# Patient Record
Sex: Female | Born: 1940
Health system: Southern US, Community
[De-identification: ages and names within clinical notes are randomized; demographics above are authoritative.]

## PROBLEM LIST (undated history)

## (undated) DIAGNOSIS — E7800 Pure hypercholesterolemia, unspecified: Secondary | ICD-10-CM

## (undated) DIAGNOSIS — N6320 Unspecified lump in the left breast, unspecified quadrant: Secondary | ICD-10-CM

## (undated) DIAGNOSIS — I10 Essential (primary) hypertension: Secondary | ICD-10-CM

## (undated) DIAGNOSIS — Z8619 Personal history of other infectious and parasitic diseases: Secondary | ICD-10-CM

## (undated) HISTORY — DX: Pure hypercholesterolemia, unspecified: E78.00

## (undated) HISTORY — PX: OTHER SURGICAL HISTORY: SHX169

## (undated) HISTORY — DX: Essential (primary) hypertension: I10

## (undated) HISTORY — DX: Personal history of other infectious and parasitic diseases: Z86.19

---

## 2009-02-13 ENCOUNTER — Ambulatory Visit: Payer: Self-pay | Admitting: Ophthalmology

## 2009-02-26 ENCOUNTER — Ambulatory Visit: Payer: Self-pay | Admitting: Ophthalmology

## 2015-01-26 ENCOUNTER — Encounter: Payer: Self-pay | Admitting: Internal Medicine

## 2015-01-26 ENCOUNTER — Ambulatory Visit (INDEPENDENT_AMBULATORY_CARE_PROVIDER_SITE_OTHER): Payer: Medicare Other | Admitting: Internal Medicine

## 2015-01-26 ENCOUNTER — Encounter (INDEPENDENT_AMBULATORY_CARE_PROVIDER_SITE_OTHER): Payer: Self-pay

## 2015-01-26 VITALS — BP 187/93 | Temp 98.1°F | Resp 70 | Ht 63.25 in | Wt 168.4 lb

## 2015-01-26 DIAGNOSIS — Z1239 Encounter for other screening for malignant neoplasm of breast: Secondary | ICD-10-CM | POA: Diagnosis not present

## 2015-01-26 DIAGNOSIS — R030 Elevated blood-pressure reading, without diagnosis of hypertension: Secondary | ICD-10-CM

## 2015-01-26 DIAGNOSIS — Z800 Family history of malignant neoplasm of digestive organs: Secondary | ICD-10-CM

## 2015-01-26 DIAGNOSIS — Z0000 Encounter for general adult medical examination without abnormal findings: Secondary | ICD-10-CM | POA: Diagnosis not present

## 2015-01-26 DIAGNOSIS — Z13220 Encounter for screening for lipoid disorders: Secondary | ICD-10-CM

## 2015-01-26 DIAGNOSIS — IMO0001 Reserved for inherently not codable concepts without codable children: Secondary | ICD-10-CM

## 2015-01-26 NOTE — Progress Notes (Signed)
Pre visit review using our clinic review tool, if applicable. No additional management support is needed unless otherwise documented below in the visit note. 

## 2015-01-28 ENCOUNTER — Encounter: Payer: Self-pay | Admitting: Internal Medicine

## 2015-01-28 DIAGNOSIS — Z0000 Encounter for general adult medical examination without abnormal findings: Secondary | ICD-10-CM | POA: Insufficient documentation

## 2015-01-28 DIAGNOSIS — I10 Essential (primary) hypertension: Secondary | ICD-10-CM | POA: Insufficient documentation

## 2015-01-28 DIAGNOSIS — Z800 Family history of malignant neoplasm of digestive organs: Secondary | ICD-10-CM | POA: Insufficient documentation

## 2015-01-28 NOTE — Assessment & Plan Note (Signed)
Needs a physical.  Schedule mammogram.  Needs colonoscopy.

## 2015-01-28 NOTE — Progress Notes (Signed)
[name], female   DOB: October 08, 1941, 74 y.o.   MRN: [id number]   [name], female    DOB: 1941/07/31, 74 y.o.   MRN: [id number]  HPI  Patient here to establish care.  She has not been seeing a regular physician.  Just goes to acute care if needed.  Stays active.  Goes to the gym.  Exercises.  Works second shift.  Gets her eyes checked regularly.  Has not had mammogram or colonoscopy.  Brother had colon cancer.  No cardiac symptoms with increased activity or exertion.  Breathing stable.  Eating and drinking well.  No nausea or vomiting.  No bowel change.     Past Medical History  Diagnosis Date  . History of chicken pox     Outpatient Encounter Prescriptions as of 01/26/2015  Medication Sig  . Nutritional Supplements (NUTRITIONAL SUPPLEMENT PO) Take by mouth. It Works-[facility]  . Nutritional Supplements (NUTRITIONAL SUPPLEMENT PO) Take by mouth. It works-[employer] [facility]    Review of Systems  Constitutional: Negative for appetite change and unexpected weight change.  HENT: Negative for congestion and sinus pressure.   Eyes: Negative for pain and visual disturbance.  Respiratory: Negative for cough, chest tightness and shortness of breath.   Cardiovascular: Negative for chest pain, palpitations and leg swelling.  Gastrointestinal: Negative for nausea, vomiting, abdominal pain and diarrhea.  Genitourinary: Negative for dysuria and difficulty urinating.  Musculoskeletal: Negative for back pain and joint swelling.  Skin: Negative for color change and rash.  Neurological: Negative for dizziness, light-headedness and headaches.  Hematological: Negative for adenopathy. Does not bruise/bleed easily.  Psychiatric/Behavioral: Negative for dysphoric mood and decreased concentration.       Objective:     Blood pressure recheck prior to leaving:  154/82  Physical Exam  Constitutional: She appears well-developed and well-nourished. No  distress.  HENT:  Nose: Nose normal.  Mouth/Throat: Oropharynx is clear and moist.  Eyes: Right eye exhibits no discharge. Left eye exhibits no discharge. No scleral icterus.  Neck: Neck supple. No thyromegaly present.  Cardiovascular: Normal rate and regular rhythm.   Pulmonary/Chest: Breath sounds normal. No respiratory distress. She has no wheezes.  Abdominal: Soft. Bowel sounds are normal. There is no tenderness.  Musculoskeletal: She exhibits no edema or tenderness.  Lymphadenopathy:    She has no cervical adenopathy.  Skin: No rash noted. No erythema.  Psychiatric: She has a normal mood and affect. Her behavior is normal.  Does appear to be anxious.     BP 187/93 mmHg  Temp(Src) 98.1 F (36.7 C) (Oral)  Resp 70  Ht 5' 3.25" (1.607 m)  Wt 168 lb 6 oz (76.374 kg)  BMI 29.57 kg/m2  SpO2 99% Wt Readings from Last 3 Encounters:  01/26/15 168 lb 6 oz (76.374 kg)      Assessment & Plan:   Problem List Items Addressed This Visit    Elevated blood pressure - Primary    Blood pressure as outlined.  She was anxious.  Improved prior to leaving, but still elevated.  Have her spot check her pressure.  Get her back in soon to reassess.  Check metabolic panel.        Relevant Orders   CBC with Differential/Platelet   Comprehensive metabolic panel   TSH   Family history of colon cancer    Brother with colon cancer.  Has never had colonoscopy.  Needs.  Discussed with her today.  Health care maintenance    Needs a physical.  Schedule mammogram.  Needs colonoscopy.         Other Visit Diagnoses    Breast cancer screening        Relevant Orders    MM DIGITAL SCREENING BILATERAL    Screening cholesterol level        Relevant Orders    Lipid panel      I spent 30 minutes with the patient and more than 50% of the time was spent in consultation regarding the above.     Einar Pheasant, MD

## 2015-01-28 NOTE — Assessment & Plan Note (Signed)
Brother with colon cancer.  Has never had colonoscopy.  Needs.  Discussed with her today.

## 2015-01-28 NOTE — Assessment & Plan Note (Signed)
Blood pressure as outlined.  She was anxious.  Improved prior to leaving, but still elevated.  Have her spot check her pressure.  Get her back in soon to reassess.  Check metabolic panel.

## 2015-02-02 ENCOUNTER — Other Ambulatory Visit (INDEPENDENT_AMBULATORY_CARE_PROVIDER_SITE_OTHER): Payer: Medicare Other

## 2015-02-02 DIAGNOSIS — IMO0001 Reserved for inherently not codable concepts without codable children: Secondary | ICD-10-CM

## 2015-02-02 DIAGNOSIS — R030 Elevated blood-pressure reading, without diagnosis of hypertension: Secondary | ICD-10-CM | POA: Diagnosis not present

## 2015-02-02 DIAGNOSIS — Z13220 Encounter for screening for lipoid disorders: Secondary | ICD-10-CM | POA: Diagnosis not present

## 2015-02-02 LAB — COMPREHENSIVE METABOLIC PANEL
ALK PHOS: 63 U/L (ref 39–117)
ALT: 22 U/L (ref 0–35)
AST: 21 U/L (ref 0–37)
Albumin: 4.2 g/dL (ref 3.5–5.2)
BILIRUBIN TOTAL: 0.6 mg/dL (ref 0.2–1.2)
BUN: 19 mg/dL (ref 6–23)
CO2: 26 mEq/L (ref 19–32)
Calcium: 10.1 mg/dL (ref 8.4–10.5)
Chloride: 109 mEq/L (ref 96–112)
Creatinine, Ser: 0.93 mg/dL (ref 0.40–1.20)
GFR: 75.84 mL/min (ref >60.00)
GLUCOSE: 90 mg/dL (ref 70–99)
Potassium: 3.9 mEq/L (ref 3.5–5.1)
Sodium: 141 mEq/L (ref 135–145)
Total Protein: 6.9 g/dL (ref 6.0–8.3)

## 2015-02-02 LAB — CBC WITH DIFFERENTIAL/PLATELET
BASOS ABS: 0 10*3/uL (ref 0.0–0.1)
Basophils Relative: 0.6 % (ref 0.0–3.0)
Eosinophils Absolute: 0.1 10*3/uL (ref 0.0–0.7)
Eosinophils Relative: 3 % (ref 0.0–5.0)
HCT: 38.9 % (ref 36.0–46.0)
Hemoglobin: 13.4 g/dL (ref 12.0–15.0)
LYMPHS PCT: 32.7 % (ref 12.0–46.0)
Lymphs Abs: 1.3 10*3/uL (ref 0.7–4.0)
MCHC: 34.3 g/dL (ref 30.0–36.0)
MCV: 89.4 fl (ref 78.0–100.0)
Monocytes Absolute: 0.3 10*3/uL (ref 0.1–1.0)
Monocytes Relative: 6.4 % (ref 3.0–12.0)
NEUTROS PCT: 57.3 % (ref 43.0–77.0)
Neutro Abs: 2.3 10*3/uL (ref 1.4–7.7)
Platelets: 277 10*3/uL (ref 150.0–400.0)
RBC: 4.36 Mil/uL (ref 3.87–5.11)
RDW: 15.4 % (ref 11.5–15.5)
WBC: 4 10*3/uL (ref 4.0–10.5)

## 2015-02-02 LAB — LIPID PANEL
CHOL/HDL RATIO: 4
Cholesterol: 241 mg/dL — ABNORMAL HIGH (ref 0–200)
HDL: 56.4 mg/dL (ref >39.00)
LDL Cholesterol: 170 mg/dL — ABNORMAL HIGH (ref 0–99)
NonHDL: 184.6
Triglycerides: 75 mg/dL (ref 0.0–149.0)
VLDL: 15 mg/dL (ref 0.0–40.0)

## 2015-02-02 LAB — TSH: TSH: 1.29 u[IU]/mL (ref 0.35–4.50)

## 2015-02-05 NOTE — Progress Notes (Signed)
Left message on Vm to return call.

## 2015-02-05 NOTE — Progress Notes (Signed)
Left message on VM to return call 

## 2015-02-07 ENCOUNTER — Encounter: Payer: Self-pay | Admitting: *Deleted

## 2015-02-07 NOTE — Progress Notes (Signed)
Left message on VM to return call 

## 2015-02-12 ENCOUNTER — Ambulatory Visit (INDEPENDENT_AMBULATORY_CARE_PROVIDER_SITE_OTHER): Payer: Medicare Other | Admitting: Internal Medicine

## 2015-02-12 ENCOUNTER — Encounter: Payer: Self-pay | Admitting: Internal Medicine

## 2015-02-12 VITALS — BP 190/90 | HR 70 | Temp 98.1°F | Ht 63.25 in | Wt 170.4 lb

## 2015-02-12 DIAGNOSIS — I10 Essential (primary) hypertension: Secondary | ICD-10-CM | POA: Diagnosis not present

## 2015-02-12 MED ORDER — LISINOPRIL-HYDROCHLOROTHIAZIDE 10-12.5 MG PO TABS: 1.0000 | ORAL_TABLET | Freq: Every day | ORAL | Status: DC

## 2015-02-12 NOTE — Progress Notes (Signed)
Pre visit review using our clinic review tool, if applicable. No additional management support is needed unless otherwise documented below in the visit note. 

## 2015-02-18 ENCOUNTER — Encounter: Payer: Self-pay | Admitting: Internal Medicine

## 2015-02-18 NOTE — Assessment & Plan Note (Signed)
Blood pressure remains elevated.  Start lisinopril/hctz 10/12.5 q day.  Follow pressures.  Check metabolic panel in 14-70 days.

## 2015-02-18 NOTE — Progress Notes (Signed)
[name], female   DOB: 04-Jan-1941, 74 y.o.   MRN: [id number]   [name], female    DOB: May 12, 1941, 74 y.o.   MRN: [id number]  HPI  Patient here for a scheduled follow up.  Here to f/u regarding her blood pressure.  Got a few outside checks - blood pressure averaging 166-173/64-73.  Feels good.  No chest pain or tightness.  No sob.  Eating and drinking well.  Drinking energy shakes.  Will stop.  Avoid decongestants.  Monitor salt intake.    Past Medical History  Diagnosis Date  . History of chicken pox     Review of Systems  Constitutional: Negative for appetite change and unexpected weight change.  Respiratory: Negative for cough, chest tightness and shortness of breath.   Cardiovascular: Negative for chest pain, palpitations and leg swelling.  Gastrointestinal: Negative for nausea, vomiting and abdominal pain.  Neurological: Negative for dizziness, light-headedness and headaches.       Objective:     Blood pressure recheck:  168/78  Physical Exam  Constitutional: She appears well-developed and well-nourished. No distress.  Neck: Neck supple. No thyromegaly present.  Cardiovascular: Normal rate and regular rhythm.   Pulmonary/Chest: Breath sounds normal. No respiratory distress. She has no wheezes.  Abdominal: Soft. Bowel sounds are normal. There is no tenderness.  Musculoskeletal: She exhibits no edema or tenderness.  Lymphadenopathy:    She has no cervical adenopathy.    BP 190/90 mmHg  Pulse 70  Temp(Src) 98.1 F (36.7 C) (Oral)  Ht 5' 3.25" (1.607 m)  Wt 170 lb 6 oz (77.282 kg)  BMI 29.93 kg/m2  SpO2 97% Wt Readings from Last 3 Encounters:  02/12/15 170 lb 6 oz (77.282 kg)  01/26/15 168 lb 6 oz (76.374 kg)     Lab Results  Component Value Date   WBC 4.0 02/02/2015   HGB 13.4 02/02/2015   HCT 38.9 02/02/2015   PLT 277.0 02/02/2015   GLUCOSE 90 02/02/2015   CHOL 241* 02/02/2015   TRIG 75.0 02/02/2015     HDL 56.40 02/02/2015   LDLCALC 170* 02/02/2015   ALT 22 02/02/2015   AST 21 02/02/2015   NA 141 02/02/2015   K 3.9 02/02/2015   CL 109 02/02/2015   CREATININE 0.93 02/02/2015   BUN 19 02/02/2015   CO2 26 02/02/2015   TSH 1.29 02/02/2015       Assessment & Plan:   Problem List Items Addressed This Visit    Hypertension, essential - Primary    Blood pressure remains elevated.  Start lisinopril/hctz 10/12.5 q day.  Follow pressures.  Check metabolic panel in 62-37 days.        Relevant Medications   lisinopril-hydrochlorothiazide (PRINZIDE,ZESTORETIC) 10-12.5 MG per tablet       Einar Pheasant, MD

## 2015-02-26 ENCOUNTER — Other Ambulatory Visit (INDEPENDENT_AMBULATORY_CARE_PROVIDER_SITE_OTHER): Payer: Medicare Other

## 2015-02-26 DIAGNOSIS — I10 Essential (primary) hypertension: Secondary | ICD-10-CM

## 2015-02-26 LAB — BASIC METABOLIC PANEL
BUN: 22 mg/dL (ref 6–23)
CO2: 27 mEq/L (ref 19–32)
Calcium: 10.1 mg/dL (ref 8.4–10.5)
Chloride: 108 mEq/L (ref 96–112)
Creatinine, Ser: 1.08 mg/dL (ref 0.40–1.20)
GFR: 63.81 mL/min (ref >60.00)
Glucose, Bld: 84 mg/dL (ref 70–99)
Potassium: 4.1 mEq/L (ref 3.5–5.1)
Sodium: 141 mEq/L (ref 135–145)

## 2015-02-27 ENCOUNTER — Encounter: Payer: Self-pay | Admitting: *Deleted

## 2015-03-14 ENCOUNTER — Encounter: Payer: Self-pay | Admitting: Internal Medicine

## 2015-03-14 ENCOUNTER — Ambulatory Visit (INDEPENDENT_AMBULATORY_CARE_PROVIDER_SITE_OTHER): Payer: Medicare Other | Admitting: Internal Medicine

## 2015-03-14 ENCOUNTER — Telehealth: Payer: Self-pay | Admitting: *Deleted

## 2015-03-14 VITALS — BP 151/76 | HR 63 | Temp 98.0°F | Ht 63.25 in | Wt 173.0 lb

## 2015-03-14 DIAGNOSIS — I10 Essential (primary) hypertension: Secondary | ICD-10-CM | POA: Diagnosis not present

## 2015-03-14 MED ORDER — LISINOPRIL-HYDROCHLOROTHIAZIDE 20-25 MG PO TABS: 1.0000 | ORAL_TABLET | Freq: Every day | ORAL | Status: DC

## 2015-03-14 NOTE — Assessment & Plan Note (Signed)
Blood pressure improved, but still elevated.  Increase lisinopril/hctz to 20/25 q day.  Follow pressures.  Will plan for f/u metabolic panel with next visit.  Continue decrease sodium intake.

## 2015-03-14 NOTE — Telephone Encounter (Signed)
Pt notified and scheduled.

## 2015-03-14 NOTE — Progress Notes (Signed)
[name], female   DOB: 15-Apr-1941, 74 y.o.   MRN: [id number]   [name], female    DOB: Sep 25, 1941, 74 y.o.   MRN: [id number]  HPI  Patient here for a scheduled follow up.  Here to follow up on her blood pressure.  Stays active.  Goes to the gym.  No cardiac symptoms with increased activity or exertion.  No sob.  Breathing stable.  Eating and drinking well.  Trying to cut back on sodium intake.  Bowels stable.  Tolerating her blood pressure medication.  No side effects.     Past Medical History  Diagnosis Date  . History of chicken pox     Current Outpatient Prescriptions on File Prior to Visit  Medication Sig Dispense Refill  . Nutritional Supplements (NUTRITIONAL SUPPLEMENT PO) Take by mouth. It Works-[facility]    . Nutritional Supplements (NUTRITIONAL SUPPLEMENT PO) Take by mouth. It works-[facility]     No current facility-administered medications on file prior to visit.    Review of Systems  Constitutional: Negative for appetite change and unexpected weight change.  HENT: Negative for congestion and sinus pressure.   Respiratory: Negative for cough, chest tightness and shortness of breath.   Cardiovascular: Negative for chest pain, palpitations and leg swelling.  Gastrointestinal: Negative for nausea, vomiting, abdominal pain and diarrhea.  Neurological: Negative for dizziness, light-headedness and headaches.       Objective:     Blood pressure recheck:  156/76  Physical Exam  Constitutional: She appears well-developed and well-nourished. No distress.  HENT:  Nose: Nose normal.  Mouth/Throat: Oropharynx is clear and moist.  Neck: Neck supple. No thyromegaly present.  Cardiovascular: Normal rate and regular rhythm.   Pulmonary/Chest: Breath sounds normal. No respiratory distress. She has no wheezes.  Abdominal: Soft. Bowel sounds are normal. There is no tenderness.  Musculoskeletal: She exhibits no edema  or tenderness.  Lymphadenopathy:    She has no cervical adenopathy.  Skin: No rash noted. No erythema.  Psychiatric: She has a normal mood and affect. Her behavior is normal.    BP 151/76 mmHg  Pulse 63  Temp(Src) 98 F (36.7 C) (Oral)  Ht 5' 3.25" (1.607 m)  Wt 173 lb (78.472 kg)  BMI 30.39 kg/m2  SpO2 99% Wt Readings from Last 3 Encounters:  03/14/15 173 lb (78.472 kg)  02/12/15 170 lb 6 oz (77.282 kg)  01/26/15 168 lb 6 oz (76.374 kg)     Lab Results  Component Value Date   WBC 4.0 02/02/2015   HGB 13.4 02/02/2015   HCT 38.9 02/02/2015   PLT 277.0 02/02/2015   GLUCOSE 84 02/26/2015   CHOL 241* 02/02/2015   TRIG 75.0 02/02/2015   HDL 56.40 02/02/2015   LDLCALC 170* 02/02/2015   ALT 22 02/02/2015   AST 21 02/02/2015   NA 141 02/26/2015   K 4.1 02/26/2015   CL 108 02/26/2015   CREATININE 1.08 02/26/2015   BUN 22 02/26/2015   CO2 27 02/26/2015   TSH 1.29 02/02/2015       Assessment & Plan:   Problem List Items Addressed This Visit    Hypertension, essential - Primary    Blood pressure improved, but still elevated.  Increase lisinopril/hctz to 20/25 q day.  Follow pressures.  Will plan for f/u metabolic panel with next visit.  Continue decrease sodium intake.        Relevant Medications   lisinopril-hydrochlorothiazide (PRINZIDE,ZESTORETIC) 20-25 MG  per tablet       Einar Pheasant, MD

## 2015-03-14 NOTE — Telephone Encounter (Signed)
Can put her in 12:30 on 04/04/15.  Thanks

## 2015-03-14 NOTE — Progress Notes (Signed)
Pre visit review using our clinic review tool, if applicable. No additional management support is needed unless otherwise documented below in the visit note. 

## 2015-03-14 NOTE — Telephone Encounter (Signed)
LVM for pt to schedule f/u appt w/ Dr. Nicki Reaper

## 2015-04-04 ENCOUNTER — Ambulatory Visit (INDEPENDENT_AMBULATORY_CARE_PROVIDER_SITE_OTHER): Payer: Medicare Other | Admitting: Internal Medicine

## 2015-04-04 ENCOUNTER — Encounter: Payer: Self-pay | Admitting: Internal Medicine

## 2015-04-04 VITALS — BP 165/71 | HR 64 | Temp 98.1°F | Ht 63.25 in | Wt 175.5 lb

## 2015-04-04 DIAGNOSIS — Z0000 Encounter for general adult medical examination without abnormal findings: Secondary | ICD-10-CM | POA: Diagnosis not present

## 2015-04-04 DIAGNOSIS — I10 Essential (primary) hypertension: Secondary | ICD-10-CM | POA: Diagnosis not present

## 2015-04-04 LAB — BASIC METABOLIC PANEL
BUN: 24 mg/dL — AB (ref 6–23)
CALCIUM: 9.6 mg/dL (ref 8.4–10.5)
CHLORIDE: 106 meq/L (ref 96–112)
CO2: 28 mEq/L (ref 19–32)
Creatinine, Ser: 1.1 mg/dL (ref 0.40–1.20)
GFR: 62.45 mL/min (ref >60.00)
Glucose, Bld: 112 mg/dL — ABNORMAL HIGH (ref 70–99)
Potassium: 4 mEq/L (ref 3.5–5.1)
Sodium: 140 mEq/L (ref 135–145)

## 2015-04-04 MED ORDER — LISINOPRIL-HYDROCHLOROTHIAZIDE 20-25 MG PO TABS: 1.0000 | ORAL_TABLET | Freq: Every day | ORAL | Status: DC

## 2015-04-04 NOTE — Progress Notes (Signed)
[name], female   DOB: 1941-06-23, 74 y.o.   MRN: [id number]   [name], female    DOB: 1940-11-13, 74 y.o.   MRN: [id number]  HPI  Patient here for a scheduled follow up.  She has been taking lisinopril/hctz 20/25.  Tolerating.  Blood pressure doing better.  Going to the gym.  Trying to watch her diet.  Feels good.  No cardiac symptoms with increased activity or exertion.  Breathing stable.     Past Medical History  Diagnosis Date  . History of chicken pox     Outpatient Encounter Prescriptions as of 04/04/2015  Medication Sig  . lisinopril-hydrochlorothiazide (PRINZIDE,ZESTORETIC) 20-25 MG per tablet Take 1 tablet by mouth daily.  . [DISCONTINUED] lisinopril-hydrochlorothiazide (PRINZIDE,ZESTORETIC) 20-25 MG per tablet Take 1 tablet by mouth daily.  . [DISCONTINUED] Nutritional Supplements (NUTRITIONAL SUPPLEMENT PO) Take by mouth. It Works-[facility]  . [DISCONTINUED] Nutritional Supplements (NUTRITIONAL SUPPLEMENT PO) Take by mouth. It works-[facility]   No facility-administered encounter medications on file as of 04/04/2015.    Review of Systems  Constitutional: Negative for appetite change and unexpected weight change.  HENT: Negative for congestion and sinus pressure.   Respiratory: Negative for cough, chest tightness and shortness of breath.   Cardiovascular: Negative for chest pain, palpitations and leg swelling.  Gastrointestinal: Negative for nausea, vomiting, abdominal pain and diarrhea.  Neurological: Negative for dizziness, light-headedness and headaches.       Objective:     Blood pressure recheck:  132/62, pulse 68  Physical Exam  Constitutional: She appears well-developed and well-nourished. No distress.  HENT:  Nose: Nose normal.  Mouth/Throat: Oropharynx is clear and moist.  Neck: Neck supple. No thyromegaly present.  Cardiovascular: Normal rate and regular rhythm.   Pulmonary/Chest: Breath  sounds normal. No respiratory distress. She has no wheezes.  Abdominal: Soft. Bowel sounds are normal. There is no tenderness.  Musculoskeletal: She exhibits no edema or tenderness.  Lymphadenopathy:    She has no cervical adenopathy.    BP 165/71 mmHg  Pulse 64  Temp(Src) 98.1 F (36.7 C) (Oral)  Ht 5' 3.25" (1.607 m)  Wt 175 lb 8 oz (79.606 kg)  BMI 30.83 kg/m2  SpO2 97% Wt Readings from Last 3 Encounters:  04/04/15 175 lb 8 oz (79.606 kg)  03/14/15 173 lb (78.472 kg)  02/12/15 170 lb 6 oz (77.282 kg)     Lab Results  Component Value Date   WBC 4.0 02/02/2015   HGB 13.4 02/02/2015   HCT 38.9 02/02/2015   PLT 277.0 02/02/2015   GLUCOSE 112* 04/04/2015   CHOL 241* 02/02/2015   TRIG 75.0 02/02/2015   HDL 56.40 02/02/2015   LDLCALC 170* 02/02/2015   ALT 22 02/02/2015   AST 21 02/02/2015   NA 140 04/04/2015   K 4.0 04/04/2015   CL 106 04/04/2015   CREATININE 1.10 04/04/2015   BUN 24* 04/04/2015   CO2 28 04/04/2015   TSH 1.29 02/02/2015       Assessment & Plan:   Problem List Items Addressed This Visit    Health care maintenance    Schedule her for a physical.       Hypertension, essential - Primary    Blood pressure doing much better.  Continue same medication regimen.  Check metabolic panel.        Relevant Medications   lisinopril-hydrochlorothiazide (PRINZIDE,ZESTORETIC) 20-25 MG per tablet   Other Relevant Orders   Basic metabolic  panel (Completed)       Einar Pheasant, MD

## 2015-04-04 NOTE — Progress Notes (Signed)
Pre visit review using our clinic review tool, if applicable. No additional management support is needed unless otherwise documented below in the visit note. 

## 2015-04-05 ENCOUNTER — Encounter: Payer: Self-pay | Admitting: *Deleted

## 2015-04-08 ENCOUNTER — Encounter: Payer: Self-pay | Admitting: Internal Medicine

## 2015-04-08 NOTE — Assessment & Plan Note (Signed)
Blood pressure doing much better.  Continue same medication regimen.  Check metabolic panel.

## 2015-04-08 NOTE — Assessment & Plan Note (Signed)
Schedule her for a physical.

## 2015-05-01 ENCOUNTER — Telehealth: Payer: Self-pay

## 2015-05-01 NOTE — Telephone Encounter (Signed)
L/M for pt to call back regarding mammogram

## 2015-07-10 ENCOUNTER — Telehealth: Payer: Self-pay | Admitting: *Deleted

## 2015-07-10 ENCOUNTER — Other Ambulatory Visit: Payer: Self-pay | Admitting: Internal Medicine

## 2015-07-10 ENCOUNTER — Other Ambulatory Visit (INDEPENDENT_AMBULATORY_CARE_PROVIDER_SITE_OTHER): Payer: Medicare Other

## 2015-07-10 DIAGNOSIS — E7800 Pure hypercholesterolemia, unspecified: Secondary | ICD-10-CM

## 2015-07-10 DIAGNOSIS — N289 Disorder of kidney and ureter, unspecified: Secondary | ICD-10-CM

## 2015-07-10 LAB — LIPID PANEL
CHOL/HDL RATIO: 6
Cholesterol: 289 mg/dL — ABNORMAL HIGH (ref 0–200)
HDL: 46 mg/dL (ref >39.00)
LDL Cholesterol: 212 mg/dL — ABNORMAL HIGH (ref 0–99)
NonHDL: 243.07
Triglycerides: 153 mg/dL — ABNORMAL HIGH (ref 0.0–149.0)
VLDL: 30.6 mg/dL (ref 0.0–40.0)

## 2015-07-10 LAB — COMPREHENSIVE METABOLIC PANEL
ALT: 23 U/L (ref 0–35)
AST: 18 U/L (ref 0–37)
Albumin: 4.2 g/dL (ref 3.5–5.2)
Alkaline Phosphatase: 63 U/L (ref 39–117)
BUN: 35 mg/dL — AB (ref 6–23)
CO2: 28 meq/L (ref 19–32)
Calcium: 10 mg/dL (ref 8.4–10.5)
Chloride: 107 mEq/L (ref 96–112)
Creatinine, Ser: 1.26 mg/dL — ABNORMAL HIGH (ref 0.40–1.20)
GFR: 53.35 mL/min — AB (ref >60.00)
Glucose, Bld: 88 mg/dL (ref 70–99)
POTASSIUM: 4.4 meq/L (ref 3.5–5.1)
Sodium: 143 mEq/L (ref 135–145)
TOTAL PROTEIN: 6.7 g/dL (ref 6.0–8.3)
Total Bilirubin: 0.4 mg/dL (ref 0.2–1.2)

## 2015-07-10 NOTE — Progress Notes (Signed)
Order placed for f/u met b 

## 2015-07-10 NOTE — Telephone Encounter (Signed)
Order placed for labs.

## 2015-07-10 NOTE — Telephone Encounter (Signed)
Labs and dx?  

## 2015-07-12 ENCOUNTER — Other Ambulatory Visit: Payer: Self-pay | Admitting: *Deleted

## 2015-07-12 MED ORDER — ROSUVASTATIN CALCIUM 10 MG PO TABS: 10.0000 mg | ORAL_TABLET | Freq: Every day | ORAL | Status: DC

## 2015-07-13 ENCOUNTER — Encounter: Payer: Self-pay | Admitting: Internal Medicine

## 2015-07-13 ENCOUNTER — Ambulatory Visit (INDEPENDENT_AMBULATORY_CARE_PROVIDER_SITE_OTHER): Payer: Medicare Other | Admitting: Internal Medicine

## 2015-07-13 VITALS — BP 138/64 | HR 70 | Temp 98.1°F | Ht 63.75 in | Wt 179.5 lb

## 2015-07-13 DIAGNOSIS — Z1239 Encounter for other screening for malignant neoplasm of breast: Secondary | ICD-10-CM | POA: Diagnosis not present

## 2015-07-13 DIAGNOSIS — E7800 Pure hypercholesterolemia, unspecified: Secondary | ICD-10-CM

## 2015-07-13 DIAGNOSIS — R7989 Other specified abnormal findings of blood chemistry: Secondary | ICD-10-CM

## 2015-07-13 DIAGNOSIS — R748 Abnormal levels of other serum enzymes: Secondary | ICD-10-CM

## 2015-07-13 DIAGNOSIS — I10 Essential (primary) hypertension: Secondary | ICD-10-CM | POA: Diagnosis not present

## 2015-07-13 DIAGNOSIS — Z0000 Encounter for general adult medical examination without abnormal findings: Secondary | ICD-10-CM | POA: Diagnosis not present

## 2015-07-13 DIAGNOSIS — Z800 Family history of malignant neoplasm of digestive organs: Secondary | ICD-10-CM

## 2015-07-13 DIAGNOSIS — N289 Disorder of kidney and ureter, unspecified: Secondary | ICD-10-CM

## 2015-07-13 LAB — BASIC METABOLIC PANEL
BUN: 38 mg/dL — ABNORMAL HIGH (ref 6–23)
CALCIUM: 9.8 mg/dL (ref 8.4–10.5)
CO2: 26 meq/L (ref 19–32)
Chloride: 103 mEq/L (ref 96–112)
Creatinine, Ser: 1.23 mg/dL — ABNORMAL HIGH (ref 0.40–1.20)
GFR: 54.86 mL/min — AB (ref >60.00)
Glucose, Bld: 101 mg/dL — ABNORMAL HIGH (ref 70–99)
Potassium: 3.8 mEq/L (ref 3.5–5.1)
SODIUM: 138 meq/L (ref 135–145)

## 2015-07-13 MED ORDER — LISINOPRIL-HYDROCHLOROTHIAZIDE 20-25 MG PO TABS: 1.0000 | ORAL_TABLET | Freq: Every day | ORAL | Status: DC

## 2015-07-13 NOTE — Progress Notes (Signed)
Pre-visit discussion using our clinic review tool. No additional management support is needed unless otherwise documented below in the visit note.  

## 2015-07-13 NOTE — Progress Notes (Signed)
[name], female   DOB: June 07, 1941, 74 y.o.   MRN: [id number]   [name], female    DOB: 02/09/1941, 74 y.o.   MRN: [id number]  HPI  Patient here to follow up on her blood pressure and for a complete physical exam.  She is staying active.  Working.  No cardiac symptoms with increased activity or exertion.  No sob.  Taking her blood pressure medication.  No acid reflux.  No abdominal pain or cramping.  Bowels stable.  States blood pressure is better.  Gets anxious when coming into the office.  Recently found to have elevated cholesterol.  Discussed starting crestor.  Has not started yet.  Plans to start today.     Past Medical History  Diagnosis Date  . History of chicken pox   . Essential hypertension   . Hypercholesterolemia    Past Surgical History  Procedure Laterality Date  . Cataract surgery      bilateral   Family History  Problem Relation Age of Onset  . Hypertension Mother   . Colon cancer Brother   . Congestive Heart Failure Mother    Social History   Social History  . Marital Status: Single    Spouse Name: N/A  . Number of Children: N/A  . Years of Education: N/A   Social History Main Topics  . Smoking status: Never Smoker   . Smokeless tobacco: Never Used  . Alcohol Use: No  . Drug Use: No  . Sexual Activity: Not Asked   Other Topics Concern  . None   Social History Narrative    Outpatient Encounter Prescriptions as of 07/13/2015  Medication Sig  . lisinopril-hydrochlorothiazide (PRINZIDE,ZESTORETIC) 20-25 MG per tablet Take 1 tablet by mouth daily.  . [DISCONTINUED] lisinopril-hydrochlorothiazide (PRINZIDE,ZESTORETIC) 20-25 MG per tablet Take 1 tablet by mouth daily.  . rosuvastatin (CRESTOR) 10 MG tablet Take 1 tablet (10 mg total) by mouth daily. (Patient not taking: Reported on 07/13/2015)   No facility-administered encounter medications on file as of 07/13/2015.    Review of Systems    Constitutional: Negative for appetite change and unexpected weight change.  HENT: Negative for congestion and sinus pressure.   Eyes: Negative for pain and visual disturbance.  Respiratory: Negative for cough, chest tightness and shortness of breath.   Cardiovascular: Negative for chest pain, palpitations and leg swelling.  Gastrointestinal: Negative for nausea, vomiting, abdominal pain and diarrhea.  Genitourinary: Negative for dysuria and difficulty urinating.  Musculoskeletal: Negative for back pain and joint swelling.  Skin: Negative for color change and rash.  Neurological: Negative for dizziness, light-headedness and headaches.  Hematological: Negative for adenopathy. Does not bruise/bleed easily.  Psychiatric/Behavioral: Negative for dysphoric mood and agitation.       Objective:     Blood pressure rechecked by me:  138/64  Physical Exam  Constitutional: She is oriented to person, place, and time. She appears well-developed and well-nourished. No distress.  HENT:  Nose: Nose normal.  Mouth/Throat: Oropharynx is clear and moist.  Eyes: Right eye exhibits no discharge. Left eye exhibits no discharge. No scleral icterus.  Neck: Neck supple. No thyromegaly present.  Cardiovascular: Normal rate and regular rhythm.   Pulmonary/Chest: Breath sounds normal. No accessory muscle usage. No tachypnea. No respiratory distress. She has no decreased breath sounds. She has no wheezes. She has no rhonchi. Right breast exhibits no inverted nipple, no mass, no nipple discharge and no tenderness (no axillary adenopathy). Left breast exhibits  no inverted nipple, no mass, no nipple discharge and no tenderness (no axilarry adenopathy).  Abdominal: Soft. Bowel sounds are normal. There is no tenderness.  Musculoskeletal: She exhibits no edema or tenderness.  Lymphadenopathy:    She has no cervical adenopathy.  Neurological: She is alert and oriented to person, place, and time.  Skin: Skin is warm.  No rash noted.  Psychiatric: She has a normal mood and affect. Her behavior is normal.    BP 138/64 mmHg  Pulse 70  Temp(Src) 98.1 F (36.7 C) (Oral)  Ht 5' 3.75" (1.619 m)  Wt 179 lb 8 oz (81.421 kg)  BMI 31.06 kg/m2  SpO2 97% Wt Readings from Last 3 Encounters:  07/13/15 179 lb 8 oz (81.421 kg)  04/04/15 175 lb 8 oz (79.606 kg)  03/14/15 173 lb (78.472 kg)     Lab Results  Component Value Date   WBC 4.0 02/02/2015   HGB 13.4 02/02/2015   HCT 38.9 02/02/2015   PLT 277.0 02/02/2015   GLUCOSE 101* 07/13/2015   CHOL 289* 07/10/2015   TRIG 153.0* 07/10/2015   HDL 46.00 07/10/2015   LDLCALC 212* 07/10/2015   ALT 23 07/10/2015   AST 18 07/10/2015   NA 138 07/13/2015   K 3.8 07/13/2015   CL 103 07/13/2015   CREATININE 1.23* 07/13/2015   BUN 38* 07/13/2015   CO2 26 07/13/2015   TSH 1.29 02/02/2015       Assessment & Plan:   Problem List Items Addressed This Visit    Elevated serum creatinine    Stay hydrated.  Recheck metabolic panel today.  Follow.        Family history of colon cancer    Has never had colonoscopy.  Discussed with her today.  Will plan to obtain colonoscopy in the future.        Health care maintenance    Physical today 07/13/15.  Schedule mammogram.  Plan colonoscopy.        Hypercholesterolemia    Low cholesterol diet and exercise.  Start crestor.  Check liver panel in 6 weeks.        Relevant Medications   lisinopril-hydrochlorothiazide (PRINZIDE,ZESTORETIC) 20-25 MG per tablet   Hypertension, essential    Blood pressure under good control.  Continue same medication regimen.  Follow pressures.  Follow metabolic panel.        Relevant Medications   lisinopril-hydrochlorothiazide (PRINZIDE,ZESTORETIC) 20-25 MG per tablet    Other Visit Diagnoses    Breast cancer screening    -  Primary    Relevant Orders    MM DIGITAL SCREENING BILATERAL    Essential hypertension, malignant        Relevant Medications     lisinopril-hydrochlorothiazide (PRINZIDE,ZESTORETIC) 20-25 MG per tablet    Other Relevant Orders    EKG 12-Lead    Renal insufficiency            Einar Pheasant, MD

## 2015-07-14 ENCOUNTER — Encounter: Payer: Self-pay | Admitting: Internal Medicine

## 2015-07-14 DIAGNOSIS — E7800 Pure hypercholesterolemia, unspecified: Secondary | ICD-10-CM | POA: Insufficient documentation

## 2015-07-14 DIAGNOSIS — N183 Chronic kidney disease, stage 3 unspecified: Secondary | ICD-10-CM | POA: Insufficient documentation

## 2015-07-14 DIAGNOSIS — R7989 Other specified abnormal findings of blood chemistry: Secondary | ICD-10-CM | POA: Insufficient documentation

## 2015-07-14 NOTE — Assessment & Plan Note (Signed)
Physical today 07/13/15.  Schedule mammogram.  Plan colonoscopy.

## 2015-07-14 NOTE — Assessment & Plan Note (Signed)
Has never had colonoscopy.  Discussed with her today.  Will plan to obtain colonoscopy in the future.

## 2015-07-14 NOTE — Assessment & Plan Note (Signed)
Blood pressure under good control.  Continue same medication regimen.  Follow pressures.  Follow metabolic panel.   

## 2015-07-14 NOTE — Assessment & Plan Note (Signed)
Low cholesterol diet and exercise.  Start crestor.  Check liver panel in 6 weeks.

## 2015-07-14 NOTE — Assessment & Plan Note (Signed)
Stay hydrated.  Recheck metabolic panel today.  Follow.

## 2015-07-15 ENCOUNTER — Other Ambulatory Visit: Payer: Self-pay | Admitting: Internal Medicine

## 2015-07-15 DIAGNOSIS — R7989 Other specified abnormal findings of blood chemistry: Secondary | ICD-10-CM

## 2015-07-15 NOTE — Progress Notes (Signed)
Order placed for f/u met b 

## 2015-07-16 ENCOUNTER — Encounter: Payer: Self-pay | Admitting: *Deleted

## 2015-07-19 ENCOUNTER — Other Ambulatory Visit: Payer: Medicare Other

## 2015-07-24 ENCOUNTER — Ambulatory Visit
Admission: RE | Admit: 2015-07-24 | Discharge: 2015-07-24 | Disposition: A | Discharge status: Home / Self Care | Payer: Medicare Other | Source: Ambulatory Visit | Attending: Internal Medicine | Admitting: Internal Medicine

## 2015-07-24 DIAGNOSIS — Z1231 Encounter for screening mammogram for malignant neoplasm of breast: Secondary | ICD-10-CM | POA: Insufficient documentation

## 2015-07-24 DIAGNOSIS — Z1239 Encounter for other screening for malignant neoplasm of breast: Secondary | ICD-10-CM

## 2015-07-25 ENCOUNTER — Other Ambulatory Visit: Payer: Self-pay | Admitting: Internal Medicine

## 2015-07-25 DIAGNOSIS — R928 Other abnormal and inconclusive findings on diagnostic imaging of breast: Secondary | ICD-10-CM

## 2015-07-25 NOTE — Progress Notes (Signed)
Order placed for diagnostic mammogram and right breast ultrasound as requested for f/u.

## 2015-07-27 ENCOUNTER — Ambulatory Visit (INDEPENDENT_AMBULATORY_CARE_PROVIDER_SITE_OTHER): Payer: Medicare Other

## 2015-07-27 VITALS — BP 140/78 | HR 68 | Temp 98.1°F | Resp 14 | Ht 64.0 in | Wt 178.8 lb

## 2015-07-27 DIAGNOSIS — Z0000 Encounter for general adult medical examination without abnormal findings: Secondary | ICD-10-CM

## 2015-07-27 DIAGNOSIS — Z23 Encounter for immunization: Secondary | ICD-10-CM | POA: Diagnosis not present

## 2015-07-27 NOTE — Progress Notes (Signed)
Subjective:   Sheri Cook is a 74 y.o. female who presents for an Initial Medicare Annual Wellness Visit.  Review of Systems    No ROS.  Medicare Wellness Visit.   Cardiac Risk Factors include: advanced age (>40men, >34 women);hypertension     Objective:    Today's Vitals   07/27/15 0858  BP: 140/78  Pulse: 68  Temp: 98.1 F (36.7 C)  TempSrc: Oral  Resp: 14  Height: 5\' 4"  (1.626 m)  Weight: 178 lb 12.8 oz (81.103 kg)  SpO2: 99%    Current Medications (verified) Outpatient Encounter Prescriptions as of 07/27/2015  Medication Sig  . lisinopril-hydrochlorothiazide (PRINZIDE,ZESTORETIC) 20-25 MG per tablet Take 1 tablet by mouth daily.  . rosuvastatin (CRESTOR) 10 MG tablet Take 1 tablet (10 mg total) by mouth daily.   No facility-administered encounter medications on file as of 07/27/2015.    Allergies (verified) Review of patient's allergies indicates no known allergies.   History: Past Medical History  Diagnosis Date  . History of chicken pox   . Essential hypertension   . Hypercholesterolemia    Past Surgical History  Procedure Laterality Date  . Cataract surgery      bilateral   Family History  Problem Relation Age of Onset  . Hypertension Mother   . Colon cancer Brother   . Congestive Heart Failure Mother    Social History   Occupational History  . Not on file.   Social History Main Topics  . Smoking status: Never Smoker   . Smokeless tobacco: Never Used  . Alcohol Use: No  . Drug Use: No  . Sexual Activity: No    Tobacco Counseling Counseling given: Not Answered   Activities of Daily Living In your present state of health, do you have any difficulty performing the following activities: 07/27/2015  Hearing? N  Vision? N  Difficulty concentrating or making decisions? N  Walking or climbing stairs? N  Dressing or bathing? N  Doing errands, shopping? N  Preparing Food and eating ? N  Using the Toilet? N  In the past six months,  have you accidently leaked urine? N  Do you have problems with loss of bowel control? N  Managing your Medications? N  Managing your Finances? N  Housekeeping or managing your Housekeeping? N    Immunizations and Health Maintenance Immunization History  Administered Date(s) Administered  . Influenza-Unspecified 08/08/2014, 07/13/2015  . Pneumococcal Conjugate-13 07/27/2015   Health Maintenance Due  Topic Date Due  . COLONOSCOPY  04/23/1991  . DEXA SCAN  04/22/2006    Patient Care Team: Einar Pheasant, MD as PCP - General (Internal Medicine)  Indicate any recent Medical Services you may have received from other than [facility] providers in the past year (date may be approximate).     Assessment:   This is a routine wellness examination for Sheri Cook. The goal of the wellness visit is to assist the patient how to close the gaps in care and create a preventative care plan for the patient.  Bone Density/Risk for Osteoporosis discussed/reviewed; Patient to call back and make referral request.  Hypertension discussed/reviewed; stable and taking Lisinopril-Hydrochlorothiazide as prescribed.   Taking all medications without issues; no barriers identified.    Colonoscopy discussed/reviewed; Patient to call back and make referral request.  Safety issues reviewed; smoke detectors in the home. Firearms in a locked case in the home. Wears seatbelts when driving or riding with others. No violence in the home.  The patient was oriented x 3;  appropriate in dress and manner and no objective failures at ADL's or IADL's.   Dentist- Followed by [facility]. Upcoming annual appointment 08/2015.   ZOSTOVAX-postponed per patient request. TDAP-postponed per patient request.  Hearing/Vision screen  Hearing Screening   Method: Audiometry   125Hz  250Hz  500Hz  1000Hz  2000Hz  4000Hz  8000Hz   Right ear:   Pass Pass Pass Fail   Left ear:   Pass Pass Pass Fail   Comments: Patient  works in a noisy environment.  Wears ear plugs when working.  No C/O trouble hearing.    Vision Screening Comments: Followed by [facility] [place]. Upcoming appointment November 2016. Cataracts removed. Wears reading glasses.  Dietary issues and exercise activities discussed: Current Exercise Habits:: Structured exercise class, Type of exercise: treadmill, Time (Minutes): 60, Frequency (Times/Week): 4, Weekly Exercise (Minutes/Week): 240, Intensity: Moderate  Goals    . Increase lean proteins     Make healthier choices when dining out.  Choose lean meats, fresh vegetables and fruits.       Depression Screen PHQ 2/9 Scores 07/27/2015 07/13/2015 01/26/2015  PHQ - 2 Score 0 0 0    Fall Risk Fall Risk  07/27/2015 07/13/2015 01/26/2015  Falls in the past year? Yes Yes No  Number falls in past yr: 1 1 -  Injury with Fall? No No -  Follow up Education provided;Falls prevention discussed - -    Cognitive Function: MMSE - Mini Mental State Exam 07/27/2015  Orientation to time 5  Orientation to Place 5  Registration 3  Attention/ Calculation 5  Recall 3  Language- name 2 objects 2  Language- repeat 1  Language- follow 3 step command 3  Language- read & follow direction 1  Write a sentence 1  Copy design 1  Total score 30    Screening Tests Health Maintenance  Topic Date Due  . COLONOSCOPY  04/23/1991  . DEXA SCAN  04/22/2006  . ZOSTAVAX  07/28/2016 (Originally 04/22/2001)  . TETANUS/TDAP  07/28/2016 (Originally 04/22/1960)  . INFLUENZA VACCINE  05/27/2016  . PNA vac Low Risk Adult (2 of 2 - PPSV23) 07/26/2016  . MAMMOGRAM  07/23/2017      Plan:    End of life planning was discussed; Advanced care planning discussed; Current HCPOA and Living Will at home.  Encouraged to bring a copy to be scanned into record.   During the course of the visit, Wrigley was educated and counseled about the following appropriate screening and preventive services:   Vaccines  to include Pneumoccal, Influenza, Hepatitis B, Td, Zostavax, HCV  Electrocardiogram  Cardiovascular disease screening  Colorectal cancer screening  Bone density screening  Diabetes screening  Glaucoma screening  Mammography/PAP  Nutrition counseling  Smoking cessation counseling  Patient Instructions (the written plan) were given to the patient.    Varney Biles, LPN   1/32/4401     Note reviewed.  Agree with plan.   Dr Nicki Reaper

## 2015-07-27 NOTE — Patient Instructions (Addendum)
Sheri Cook,  Thank you for taking time to come for your Medicare Wellness Visit.  I appreciate your ongoing commitment to your health goals. Please review the following plan we discussed and let me know if I can assist you in the future.  Bring a copy of the [place] of Attorney paperwork and bring to your follow up visit.  Overdue for: COLONOSCOPY BONE DENSITY  Bone Densitometry Bone densitometry is a special X-ray that measures your bone density and can be used to help predict your risk of bone fractures. This test is used to determine bone mineral content and density to diagnose osteoporosis. Osteoporosis is the loss of bone that may cause the bone to become weak. Osteoporosis commonly occurs in women entering menopause. However, it may be found in men and in people with other diseases. PREPARATION FOR TEST No preparation necessary. WHO SHOULD BE TESTED?  All women older than 38.  Postmenopausal women (50 to 81) with risk factors for osteoporosis.  People with a previous fracture caused by normal activities.  People with a small body frame (less than 127 poundsor a body mass index [BMI] of less than 21).  People who have a parent with a hip fracture or history of osteoporosis.  People who smoke.  People who have rheumatoid arthritis.  Anyone who engages in excessive alcohol use (more than 3 drinks most days).  Women who experience early menopause. WHEN SHOULD YOU BE RETESTED? Current guidelines suggest that you should wait at least 2 years before doing a bone density test again if your first test was normal.Recent studies indicated that women with normal bone density may be able to wait a few years before needing to repeat a bone density test. You should discuss this with your caregiver.  NORMAL FINDINGS   Normal: less than standard deviation below normal (greater than -1).  Osteopenia: 1 to 2.5 standard deviations below normal (-1 to -2.5).  Osteoporosis:  greater than 2.5 standard deviations below normal (less than -2.5). Test results are reported as a "T score" and a "Z score."The T score is a number that compares your bone density with the bone density of healthy, young women.The Z score is a number that compares your bone density with the scores of women who are the same age, gender, and race.  Ranges for normal findings may vary among different laboratories and hospitals. You should always check with your doctor after having lab work or other tests done to discuss the meaning of your test results and whether your values are considered within normal limits. MEANING OF TEST  Your caregiver will go over the test results with you and discuss the importance and meaning of your results, as well as treatment options and the need for additional tests if necessary. OBTAINING THE TEST RESULTS It is your responsibility to obtain your test results. Ask the lab or department performing the test when and how you will get your results. Document Released: 11/04/2004 Document Revised: 01/05/2012 Document Reviewed: 11/27/2010 [facility] Patient Information 2015 [place], [place]. This information is not intended to replace advice given to you by your health care Sheri Cook. Make sure you discuss any questions you have with your health care Sheri Cook.  Colonoscopy A colonoscopy is an exam to look at the entire large intestine (colon). This exam can help find problems such as tumors, polyps, inflammation, and areas of bleeding. The exam takes about 1 hour.  LET [facility] CARE Sheri Cook KNOW ABOUT:   Any allergies you have.  All medicines you are taking, including vitamins, herbs, eye drops, creams, and over-the-counter medicines.  Previous problems you or members of your family have had with the use of anesthetics.  Any blood disorders you have.  Previous surgeries you have had.  Medical conditions you have. RISKS AND COMPLICATIONS  Generally, this is a safe  procedure. However, as with any procedure, complications can occur. Possible complications include:  Bleeding.  Tearing or rupture of the colon wall.  Reaction to medicines given during the exam.  Infection (rare). BEFORE THE PROCEDURE   Ask your health care Sheri Cook about changing or stopping your regular medicines.  You may be prescribed an oral bowel prep. This involves drinking a large amount of medicated liquid, starting the day before your procedure. The liquid will cause you to have multiple loose stools until your stool is almost clear or light green. This cleans out your colon in preparation for the procedure.  Do not eat or drink anything else once you have started the bowel prep, unless your health care Sheri Cook tells you it is safe to do so.  Arrange for someone to drive you home after the procedure. PROCEDURE   You will be given medicine to help you relax (sedative).  You will lie on your side with your knees bent.  A long, flexible tube with a light and camera on the end (colonoscope) will be inserted through the rectum and into the colon. The camera sends video back to a computer screen as it moves through the colon. The colonoscope also releases carbon dioxide gas to inflate the colon. This helps your health care Sheri Cook see the area better.  During the exam, your health care Sheri Cook may take a small tissue sample (biopsy) to be examined under a microscope if any abnormalities are found.  The exam is finished when the entire colon has been viewed. AFTER THE PROCEDURE   Do not drive for 24 hours after the exam.  You may have a small amount of blood in your stool.  You may pass moderate amounts of gas and have mild abdominal cramping or bloating. This is caused by the gas used to inflate your colon during the exam.  Ask when your test results will be ready and how you will get your results. Make sure you get your test results. Document Released: 10/10/2000  Document Revised: 08/03/2013 Document Reviewed: 06/20/2013 [facility] Patient Information 2015 [place], [place]. This information is not intended to replace advice given to you by your health care Clista Rainford. Make sure you discuss any questions you have with your health care Anairis Knick.  Fat and Cholesterol Control Diet Fat and cholesterol levels in your blood and organs are influenced by your diet. High levels of fat and cholesterol may lead to diseases of the heart, small and large blood vessels, gallbladder, liver, and pancreas. CONTROLLING FAT AND CHOLESTEROL WITH DIET Although exercise and lifestyle factors are important, your diet is key. That is because certain foods are known to raise cholesterol and others to lower it. The goal is to balance foods for their effect on cholesterol and more importantly, to replace saturated and trans fat with other types of fat, such as monounsaturated fat, polyunsaturated fat, and omega-3 fatty acids. On average, a person should consume no more than 15 to 17 g of saturated fat daily. Saturated and trans fats are considered "bad" fats, and they will raise LDL cholesterol. Saturated fats are primarily found in animal products such as meats, butter, and cream. However, that  does not mean you need to give up all your favorite foods. Today, there are good tasting, low-fat, low-cholesterol substitutes for most of the things you like to eat. Choose low-fat or nonfat alternatives. Choose round or loin cuts of red meat. These types of cuts are lowest in fat and cholesterol. Chicken (without the skin), fish, veal, and ground [place] breast are great choices. Eliminate fatty meats, such as hot dogs and salami. Even shellfish have little or no saturated fat. Have a 3 oz (85 g) portion when you eat lean meat, poultry, or fish. Trans fats are also called "partially hydrogenated oils." They are oils that have been scientifically manipulated so that they are solid at room temperature  resulting in a longer shelf life and improved taste and texture of foods in which they are added. Trans fats are found in stick margarine, some tub margarines, cookies, crackers, and baked goods.  When baking and cooking, oils are a great substitute for butter. The monounsaturated oils are especially beneficial since it is believed they lower LDL and raise HDL. The oils you should avoid entirely are saturated tropical oils, such as coconut and palm.  Remember to eat a lot from food groups that are naturally free of saturated and trans fat, including fish, fruit, vegetables, beans, grains (barley, rice, couscous, bulgur wheat), and pasta (without cream sauces).  IDENTIFYING FOODS THAT LOWER FAT AND CHOLESTEROL  Soluble fiber may lower your cholesterol. This type of fiber is found in fruits such as apples, vegetables such as broccoli, potatoes, and carrots, legumes such as beans, peas, and lentils, and grains such as barley. Foods fortified with plant sterols (phytosterol) may also lower cholesterol. You should eat at least 2 g per day of these foods for a cholesterol lowering effect.  Read package labels to identify low-saturated fats, trans fat free, and low-fat foods at the supermarket. Select cheeses that have only 2 to 3 g saturated fat per ounce. Use a heart-healthy tub margarine that is free of trans fats or partially hydrogenated oil. When buying baked goods (cookies, crackers), avoid partially hydrogenated oils. Breads and muffins should be made from whole grains (whole-wheat or whole oat flour, instead of "flour" or "enriched flour"). Buy non-creamy canned soups with reduced salt and no added fats.  FOOD PREPARATION TECHNIQUES  Never deep-fry. If you must fry, either stir-fry, which uses very little fat, or use non-stick cooking sprays. When possible, broil, bake, or roast meats, and steam vegetables. Instead of putting butter or margarine on vegetables, use lemon and herbs, applesauce, and cinnamon  (for squash and sweet potatoes). Use nonfat yogurt, salsa, and low-fat dressings for salads.  LOW-SATURATED FAT / LOW-FAT FOOD SUBSTITUTES Meats / Saturated Fat (g)  Avoid: Steak, marbled (3 oz/85 g) / 11 g  Choose: Steak, lean (3 oz/85 g) / 4 g  Avoid: Hamburger (3 oz/85 g) / 7 g  Choose: Hamburger, lean (3 oz/85 g) / 5 g  Avoid: Ham (3 oz/85 g) / 6 g  Choose: Ham, lean cut (3 oz/85 g) / 2.4 g  Avoid: Chicken, with skin, dark meat (3 oz/85 g) / 4 g  Choose: Chicken, skin removed, dark meat (3 oz/85 g) / 2 g  Avoid: Chicken, with skin, light meat (3 oz/85 g) / 2.5 g  Choose: Chicken, skin removed, light meat (3 oz/85 g) / 1 g Dairy / Saturated Fat (g)  Avoid: Whole milk (1 cup) / 5 g  Choose: Low-fat milk, 2% (1 cup) / 3 g  Choose: Low-fat milk, 1% (1 cup) / 1.5 g  Choose: Skim milk (1 cup) / 0.3 g  Avoid: Hard cheese (1 oz/28 g) / 6 g  Choose: Skim milk cheese (1 oz/28 g) / 2 to 3 g  Avoid: Cottage cheese, 4% fat (1 cup) / 6.5 g  Choose: Low-fat cottage cheese, 1% fat (1 cup) / 1.5 g  Avoid: Ice cream (1 cup) / 9 g  Choose: Sherbet (1 cup) / 2.5 g  Choose: Nonfat frozen yogurt (1 cup) / 0.3 g  Choose: Frozen fruit bar / trace  Avoid: Whipped cream (1 tbs) / 3.5 g  Choose: Nondairy whipped topping (1 tbs) / 1 g Condiments / Saturated Fat (g)  Avoid: Mayonnaise (1 tbs) / 2 g  Choose: Low-fat mayonnaise (1 tbs) / 1 g  Avoid: Butter (1 tbs) / 7 g  Choose: Extra light margarine (1 tbs) / 1 g  Avoid: Coconut oil (1 tbs) / 11.8 g  Choose: Olive oil (1 tbs) / 1.8 g  Choose: Corn oil (1 tbs) / 1.7 g  Choose: Safflower oil (1 tbs) / 1.2 g  Choose: Sunflower oil (1 tbs) / 1.4 g  Choose: Soybean oil (1 tbs) / 2.4 g  Choose: Canola oil (1 tbs) / 1 g Document Released: 10/13/2005 Document Revised: 02/07/2013 Document Reviewed: 01/11/2014 [employer] Patient Information 2015 [place], [place]. This information is not intended to replace advice given to you by  your health care Dedric Ethington. Make sure you discuss any questions you have with your health care Julio Zappia.  Fall Prevention and Home Safety Falls cause injuries and can affect all age groups. It is possible to prevent falls.  HOW TO PREVENT FALLS  Wear shoes with rubber soles that do not have an opening for your toes.  Keep the inside and outside of your house well lit.  Use night lights throughout your home.  Remove clutter from floors.  Clean up floor spills.  Remove throw rugs or fasten them to the floor with carpet tape.  Do not place electrical cords across pathways.  Put grab bars by your tub, shower, and toilet. Do not use towel bars as grab bars.  Put handrails on both sides of the stairway. Fix loose handrails.  Do not climb on stools or stepladders, if possible.  Do not wax your floors.  Repair uneven or unsafe sidewalks, walkways, or stairs.  Keep items you use a lot within reach.  Be aware of pets.  Keep emergency numbers next to the telephone.  Put smoke detectors in your home and near bedrooms. Ask your doctor what other things you can do to prevent falls. Document Released: 08/09/2009 Document Revised: 04/13/2012 Document Reviewed: 01/13/2012 [facility] Patient Information 2015 [place], [place]. This information is not intended to replace advice given to you by your health care Mckade Gurka. Make sure you discuss any questions you have with your health care Germany Chelf.

## 2015-07-27 NOTE — Progress Notes (Deleted)
Subjective:   Sheri Cook is a 74 y.o. female who presents for Medicare Annual (Subsequent) preventive examination.  Review of Systems:  *** Cardiac Risk Factors include: advanced age (>71men, >59 women);hypertension     Objective:     Vitals: BP 140/78 mmHg  Pulse 68  Temp(Src) 98.1 F (36.7 C) (Oral)  Resp 14  Ht 5\' 4"  (1.626 m)  Wt 178 lb 12.8 oz (81.103 kg)  BMI 30.68 kg/m2  SpO2 99%  Tobacco History  Smoking status  . Never Smoker   Smokeless tobacco  . Never Used     Counseling given: Not Answered   Past Medical History  Diagnosis Date  . History of chicken pox   . Essential hypertension   . Hypercholesterolemia    Past Surgical History  Procedure Laterality Date  . Cataract surgery      bilateral   Family History  Problem Relation Age of Onset  . Hypertension Mother   . Colon cancer Brother   . Congestive Heart Failure Mother    History  Sexual Activity  . Sexual Activity: No    Outpatient Encounter Prescriptions as of 07/27/2015  Medication Sig  . lisinopril-hydrochlorothiazide (PRINZIDE,ZESTORETIC) 20-25 MG per tablet Take 1 tablet by mouth daily.  . rosuvastatin (CRESTOR) 10 MG tablet Take 1 tablet (10 mg total) by mouth daily.   No facility-administered encounter medications on file as of 07/27/2015.    Activities of Daily Living In your present state of health, do you have any difficulty performing the following activities: 07/27/2015  Hearing? N  Vision? N  Difficulty concentrating or making decisions? N  Walking or climbing stairs? N  Dressing or bathing? N  Doing errands, shopping? N  Preparing Food and eating ? N  Using the Toilet? N  In the past six months, have you accidently leaked urine? N  Do you have problems with loss of bowel control? N  Managing your Medications? N  Managing your Finances? N  Housekeeping or managing your Housekeeping? N    Patient Care Team: Einar Pheasant, MD as PCP - General (Internal  Medicine)    Assessment:    *** Exercise Activities and Dietary recommendations Current Exercise Habits:: Structured exercise class, Type of exercise: treadmill, Time (Minutes): 60, Frequency (Times/Week): 4, Weekly Exercise (Minutes/Week): 240, Intensity: Moderate  Goals    . Increase lean proteins     Make healthier choices when dining out.  Choose lean meats, fresh vegetables and fruits.       Fall Risk Fall Risk  07/27/2015 07/13/2015 01/26/2015  Falls in the past year? Yes Yes No  Number falls in past yr: 1 1 -  Injury with Fall? No No -  Follow up Education provided;Falls prevention discussed - -   Depression Screen PHQ 2/9 Scores 07/27/2015 07/13/2015 01/26/2015  PHQ - 2 Score 0 0 0     Cognitive Testing MMSE - Mini Mental State Exam 07/27/2015  Orientation to time 5  Orientation to Place 5  Registration 3  Attention/ Calculation 5  Recall 3  Language- name 2 objects 2  Language- repeat 1  Language- follow 3 step command 3  Language- read & follow direction 1  Write a sentence 1  Copy design 1  Total score 30    Immunization History  Administered Date(s) Administered  . Influenza-Unspecified 08/08/2014, 07/13/2015   Screening Tests Health Maintenance  Topic Date Due  . TETANUS/TDAP  04/22/1960  . COLONOSCOPY  04/23/1991  . ZOSTAVAX  04/22/2001  .  DEXA SCAN  04/22/2006  . PNA vac Low Risk Adult (1 of 2 - PCV13) 04/22/2006  . INFLUENZA VACCINE  05/28/2015  . MAMMOGRAM  07/23/2017      Plan:   *** During the course of the visit the patient was educated and counseled about the following appropriate screening and preventive services:   Vaccines to include Pneumoccal, Influenza, Hepatitis B, Td, Zostavax, HCV  Electrocardiogram  Cardiovascular Disease  Colorectal cancer screening  Bone density screening  Diabetes screening  Glaucoma screening  Mammography/PAP  Nutrition counseling   Patient Instructions (the written plan) was given to the  patient.   Varney Biles, LPN  02/28/6978

## 2015-07-31 ENCOUNTER — Other Ambulatory Visit (INDEPENDENT_AMBULATORY_CARE_PROVIDER_SITE_OTHER): Payer: Medicare Other

## 2015-07-31 ENCOUNTER — Telehealth: Payer: Self-pay

## 2015-07-31 ENCOUNTER — Other Ambulatory Visit: Payer: Self-pay | Admitting: Internal Medicine

## 2015-07-31 ENCOUNTER — Ambulatory Visit
Admission: RE | Admit: 2015-07-31 | Discharge: 2015-07-31 | Disposition: A | Discharge status: Home / Self Care | Payer: Medicare Other | Source: Ambulatory Visit | Attending: Internal Medicine | Admitting: Internal Medicine

## 2015-07-31 ENCOUNTER — Other Ambulatory Visit: Payer: Self-pay

## 2015-07-31 DIAGNOSIS — R928 Other abnormal and inconclusive findings on diagnostic imaging of breast: Secondary | ICD-10-CM

## 2015-07-31 DIAGNOSIS — R7989 Other specified abnormal findings of blood chemistry: Secondary | ICD-10-CM

## 2015-07-31 DIAGNOSIS — Z0000 Encounter for general adult medical examination without abnormal findings: Secondary | ICD-10-CM

## 2015-07-31 DIAGNOSIS — R748 Abnormal levels of other serum enzymes: Secondary | ICD-10-CM

## 2015-07-31 LAB — BASIC METABOLIC PANEL
BUN: 21 mg/dL (ref 6–23)
CALCIUM: 9.8 mg/dL (ref 8.4–10.5)
CHLORIDE: 106 meq/L (ref 96–112)
CO2: 28 meq/L (ref 19–32)
Creatinine, Ser: 1.15 mg/dL (ref 0.40–1.20)
GFR: 59.28 mL/min — ABNORMAL LOW (ref >60.00)
Glucose, Bld: 156 mg/dL — ABNORMAL HIGH (ref 70–99)
POTASSIUM: 3.8 meq/L (ref 3.5–5.1)
SODIUM: 142 meq/L (ref 135–145)

## 2015-07-31 NOTE — Telephone Encounter (Signed)
error 

## 2015-07-31 NOTE — Telephone Encounter (Signed)
FYI:  Per the AWV, patient brought completed HCPOA and Living Will forms (given to [facility] for scan) and requested Bone Density referral.  Referral placed and appointment made.

## 2015-08-01 ENCOUNTER — Encounter: Payer: Self-pay | Admitting: *Deleted

## 2015-08-01 ENCOUNTER — Other Ambulatory Visit: Payer: Self-pay | Admitting: Internal Medicine

## 2015-08-01 DIAGNOSIS — R928 Other abnormal and inconclusive findings on diagnostic imaging of breast: Secondary | ICD-10-CM

## 2015-08-01 NOTE — Progress Notes (Signed)
Order placed for f/u right breast mammogram (3D).

## 2015-08-09 ENCOUNTER — Other Ambulatory Visit: Payer: Self-pay | Admitting: Internal Medicine

## 2015-08-09 DIAGNOSIS — R928 Other abnormal and inconclusive findings on diagnostic imaging of breast: Secondary | ICD-10-CM

## 2015-08-09 NOTE — Progress Notes (Signed)
Order placed for right breast ultrasound.  

## 2015-08-10 ENCOUNTER — Encounter: Payer: Self-pay | Admitting: Internal Medicine

## 2015-08-13 ENCOUNTER — Encounter: Payer: Self-pay | Admitting: *Deleted

## 2015-08-13 ENCOUNTER — Ambulatory Visit
Admission: RE | Admit: 2015-08-13 | Discharge: 2015-08-13 | Disposition: A | Discharge status: Home / Self Care | Payer: Medicare Other | Source: Ambulatory Visit | Attending: Internal Medicine | Admitting: Internal Medicine

## 2015-08-13 DIAGNOSIS — Z780 Asymptomatic menopausal state: Secondary | ICD-10-CM | POA: Diagnosis not present

## 2015-08-13 DIAGNOSIS — Z0000 Encounter for general adult medical examination without abnormal findings: Secondary | ICD-10-CM

## 2015-08-13 DIAGNOSIS — Z13820 Encounter for screening for osteoporosis: Secondary | ICD-10-CM | POA: Insufficient documentation

## 2015-08-24 ENCOUNTER — Other Ambulatory Visit (INDEPENDENT_AMBULATORY_CARE_PROVIDER_SITE_OTHER): Payer: Medicare Other

## 2015-08-24 DIAGNOSIS — R7989 Other specified abnormal findings of blood chemistry: Secondary | ICD-10-CM

## 2015-08-24 DIAGNOSIS — R748 Abnormal levels of other serum enzymes: Secondary | ICD-10-CM | POA: Diagnosis not present

## 2015-08-24 LAB — HEPATIC FUNCTION PANEL
ALBUMIN: 4 g/dL (ref 3.5–5.2)
ALT: 23 U/L (ref 0–35)
AST: 19 U/L (ref 0–37)
Alkaline Phosphatase: 56 U/L (ref 39–117)
BILIRUBIN TOTAL: 0.3 mg/dL (ref 0.2–1.2)
Bilirubin, Direct: 0.1 mg/dL (ref 0.0–0.3)
Total Protein: 6.7 g/dL (ref 6.0–8.3)

## 2015-10-09 ENCOUNTER — Other Ambulatory Visit: Payer: Self-pay | Admitting: Internal Medicine

## 2015-10-12 ENCOUNTER — Ambulatory Visit (INDEPENDENT_AMBULATORY_CARE_PROVIDER_SITE_OTHER): Payer: Medicare Other | Admitting: Internal Medicine

## 2015-10-12 ENCOUNTER — Encounter: Payer: Self-pay | Admitting: Internal Medicine

## 2015-10-12 VITALS — BP 138/80 | HR 65 | Temp 97.4°F | Resp 18 | Ht 63.75 in | Wt 183.0 lb

## 2015-10-12 DIAGNOSIS — R928 Other abnormal and inconclusive findings on diagnostic imaging of breast: Secondary | ICD-10-CM

## 2015-10-12 DIAGNOSIS — R748 Abnormal levels of other serum enzymes: Secondary | ICD-10-CM

## 2015-10-12 DIAGNOSIS — E7800 Pure hypercholesterolemia, unspecified: Secondary | ICD-10-CM | POA: Diagnosis not present

## 2015-10-12 DIAGNOSIS — R7989 Other specified abnormal findings of blood chemistry: Secondary | ICD-10-CM

## 2015-10-12 DIAGNOSIS — I10 Essential (primary) hypertension: Secondary | ICD-10-CM

## 2015-10-12 MED ORDER — LOSARTAN POTASSIUM-HCTZ 100-12.5 MG PO TABS: 1.0000 | ORAL_TABLET | Freq: Every day | ORAL | Status: DC

## 2015-10-12 NOTE — Progress Notes (Signed)
[name], female   DOB: 1941/01/26, 74 y.o.   MRN: [id number]   [name], female    DOB: 04-Jun-1941, 74 y.o.   MRN: [id number]  HPI  Patient with past history of hypercholesterolemia and hypertension.  She comes in today to follow up on these issues.  She has noticed a persistent dry cough.  She feels the cough is related to her blood pressure medication.  No chest congestion.  No fever.  No chest pain or tightness.  No sob.  No acid reflux.  No abdominal pain or cramping.  Bowels stable.  Taking the cholesterol medication.  No problems.  States her blood pressures have been in the [id number] systolic range.     Past Medical History  Diagnosis Date  . History of chicken pox   . Essential hypertension   . Hypercholesterolemia    Past Surgical History  Procedure Laterality Date  . Cataract surgery      bilateral   Family History  Problem Relation Age of Onset  . Hypertension Mother   . Colon cancer Brother   . Congestive Heart Failure Mother    Social History   Social History  . Marital Status: Single    Spouse Name: N/A  . Number of Children: N/A  . Years of Education: N/A   Social History Main Topics  . Smoking status: Never Smoker   . Smokeless tobacco: Never Used  . Alcohol Use: No  . Drug Use: No  . Sexual Activity: No   Other Topics Concern  . None   Social History Narrative    Outpatient Encounter Prescriptions as of 10/12/2015  Medication Sig  . rosuvastatin (CRESTOR) 10 MG tablet TAKE ONE TABLET BY MOUTH ONCE DAILY  . [DISCONTINUED] lisinopril-hydrochlorothiazide (PRINZIDE,ZESTORETIC) 20-25 MG per tablet Take 1 tablet by mouth daily.  Marland Kitchen losartan-hydrochlorothiazide (HYZAAR) 100-12.5 MG tablet Take 1 tablet by mouth daily.   No facility-administered encounter medications on file as of 10/12/2015.    Review of Systems  Constitutional: Negative for appetite change and unexpected weight change.  HENT:  Negative for congestion and sinus pressure.   Respiratory: Positive for cough (dry cough). Negative for chest tightness and shortness of breath.   Cardiovascular: Negative for chest pain, palpitations and leg swelling.  Gastrointestinal: Negative for nausea, vomiting, abdominal pain and diarrhea.  Musculoskeletal: Negative for back pain and joint swelling.  Skin: Negative for color change and rash.  Neurological: Negative for dizziness, light-headedness and headaches.  Psychiatric/Behavioral: Negative for dysphoric mood and agitation.       Objective:    Physical Exam  Constitutional: She appears well-developed and well-nourished. No distress.  HENT:  Nose: Nose normal.  Mouth/Throat: Oropharynx is clear and moist.  Neck: Neck supple. No thyromegaly present.  Cardiovascular: Normal rate and regular rhythm.   Pulmonary/Chest: Breath sounds normal. No respiratory distress. She has no wheezes.  Abdominal: Soft. Bowel sounds are normal. There is no tenderness.  Musculoskeletal: She exhibits no edema or tenderness.  Lymphadenopathy:    She has no cervical adenopathy.  Skin: No rash noted. No erythema.  Psychiatric: She has a normal mood and affect. Her behavior is normal.    BP 138/80 mmHg  Pulse 65  Temp(Src) 97.4 F (36.3 C) (Oral)  Resp 18  Ht 5' 3.75" (1.619 m)  Wt 183 lb (83.008 kg)  BMI 31.67 kg/m2  SpO2 97% Wt Readings from Last 3 Encounters:  10/12/15 183 lb (83.008 kg)  07/27/15 178 lb 12.8 oz (81.103 kg)  07/13/15 179 lb 8 oz (81.421 kg)     Lab Results  Component Value Date   WBC 4.0 02/02/2015   HGB 13.4 02/02/2015   HCT 38.9 02/02/2015   PLT 277.0 02/02/2015   GLUCOSE 156* 07/31/2015   CHOL 289* 07/10/2015   TRIG 153.0* 07/10/2015   HDL 46.00 07/10/2015   LDLCALC 212* 07/10/2015   ALT 23 08/24/2015   AST 19 08/24/2015   NA 142 07/31/2015   K 3.8 07/31/2015   CL 106 07/31/2015   CREATININE 1.15 07/31/2015   BUN 21 07/31/2015   CO2 28 07/31/2015    TSH 1.29 02/02/2015    Dg Bone Density  08/13/2015  EXAM: DUAL X-RAY ABSORPTIOMETRY (DXA) FOR BONE MINERAL DENSITY IMPRESSION: Dear Dr. Nicki Cook, Your patient Sheri Cook completed a BMD test on 08/13/2015 using the [place] (analysis version: 14.10) manufactured by [employer]. The following summarizes the results of our evaluation. PATIENT BIOGRAPHICAL: Name: Sheri Cook Patient ID: [id number] Birth Date: 1940/11/11 Height: 63.5 in. Gender: Female Exam Date: 08/13/2015 Weight: 179.8 lbs. Indications: Postmenopausal Fractures: Treatments: crestor ASSESSMENT: The BMD measured at Femur Neck Left is 0.910 g/cm2 with a T-score of -0.9. This patient is considered normal according to [facility]) criteria. Site Region Measured Measured WHO Young Adult BMD Date       Age      Classification T-score AP Spine L1-L4 08/13/2015 74.3 Normal -0.2 1.174 g/cm2 DualFemur Neck Left 08/13/2015 74.3 Normal -0.9 0.910 g/cm2 World Health Organization [facility]) criteria for post-menopausal, Caucasian Women: Normal:       T-score at or above -1 SD Osteopenia:   T-score between -1 and -2.5 SD Osteoporosis: T-score at or below -2.5 SD RECOMMENDATIONS: [place] recommends that FDA-approved medical therapies be considered in postmenopausal women and men age 43 or older with a: 1. Hip or vertebral (clinical or morphometric) fracture. 2. T-score of < -2.5 at the spine or hip. 3. Ten-year fracture probability by FRAX of 3% or greater for hip fracture or 20% or greater for major osteoporotic fracture. All treatment decisions require clinical judgment and consideration of individual patient factors, including patient preferences, co-morbidities, previous drug use, risk factors not captured in the FRAX model (e.g. falls, vitamin D deficiency, increased bone turnover, interval significant decline in bone density) and possible under - or over-estimation of fracture risk by FRAX. All  patients should ensure an adequate intake of dietary calcium (1200 mg/d) and vitamin D (800 IU daily) unless contraindicated. FOLLOW-UP: People with diagnosed cases of osteoporosis or at high risk for fracture should have regular bone mineral density tests. For patients eligible for Medicare, routine testing is allowed once every 2 years. The testing frequency can be increased to one year for patients who have rapidly progressing disease, those who are receiving or discontinuing medical therapy to restore bone mass, or have additional risk factors. I have reviewed this report, and agree with the above findings. [facility] Radiology Electronically Signed   By: Rolm Baptise M.D.   On: 08/13/2015 09:36       Assessment & Plan:   Problem List Items Addressed This Visit    Abnormal mammogram    Mammogram 07/24/15 as outlined.  Recommended f/u right breast diagnostic mammogram with 3D in 6 months.  Follow.        Elevated serum creatinine    Creatinine 07/2015 - 1.15.  Follow.  Stay hydrated.  Hypercholesterolemia    On crestor.  Low cholesterol diet and exercise.  Follow lipid panel and liver function tests.        Relevant Medications   losartan-hydrochlorothiazide (HYZAAR) 100-12.5 MG tablet   Other Relevant Orders   Lipid panel   Hepatic function panel   Hypertension, essential - Primary    Has the cough as outlined.  Dry cough.  Change her blood pressure medication to losartan/hctz 100/12.5.  Follow pressures.  See if cough resolved off ace inhibitor.        Relevant Medications   losartan-hydrochlorothiazide (HYZAAR) 100-12.5 MG tablet   Other Relevant Orders   Basic metabolic panel       Einar Pheasant, MD

## 2015-10-12 NOTE — Progress Notes (Signed)
Pre-visit discussion using our clinic review tool. No additional management support is needed unless otherwise documented below in the visit note.  

## 2015-10-12 NOTE — Patient Instructions (Signed)
Stop the lisinopril/hctz.    Start losartan/hctz 100/12.5 - one per day

## 2015-10-14 ENCOUNTER — Encounter: Payer: Self-pay | Admitting: Internal Medicine

## 2015-10-14 DIAGNOSIS — R928 Other abnormal and inconclusive findings on diagnostic imaging of breast: Secondary | ICD-10-CM | POA: Insufficient documentation

## 2015-10-14 NOTE — Assessment & Plan Note (Signed)
Has the cough as outlined.  Dry cough.  Change her blood pressure medication to losartan/hctz 100/12.5.  Follow pressures.  See if cough resolved off ace inhibitor.

## 2015-10-14 NOTE — Assessment & Plan Note (Signed)
Mammogram 07/24/15 as outlined.  Recommended f/u right breast diagnostic mammogram with 3D in 6 months.  Follow.

## 2015-10-14 NOTE — Assessment & Plan Note (Signed)
On crestor.  Low cholesterol diet and exercise.  Follow lipid panel and liver function tests.   

## 2015-10-14 NOTE — Assessment & Plan Note (Signed)
Creatinine 07/2015 - 1.15.  Follow.  Stay hydrated.

## 2015-12-11 ENCOUNTER — Other Ambulatory Visit (INDEPENDENT_AMBULATORY_CARE_PROVIDER_SITE_OTHER): Payer: Medicare Other

## 2015-12-11 ENCOUNTER — Encounter: Payer: Self-pay | Admitting: *Deleted

## 2015-12-11 DIAGNOSIS — E7800 Pure hypercholesterolemia, unspecified: Secondary | ICD-10-CM | POA: Diagnosis not present

## 2015-12-11 DIAGNOSIS — I10 Essential (primary) hypertension: Secondary | ICD-10-CM | POA: Diagnosis not present

## 2015-12-11 LAB — BASIC METABOLIC PANEL
BUN: 26 mg/dL — ABNORMAL HIGH (ref 6–23)
CALCIUM: 9.8 mg/dL (ref 8.4–10.5)
CO2: 28 mEq/L (ref 19–32)
Chloride: 111 mEq/L (ref 96–112)
Creatinine, Ser: 1.13 mg/dL (ref 0.40–1.20)
GFR: 60.43 mL/min (ref >60.00)
GLUCOSE: 98 mg/dL (ref 70–99)
POTASSIUM: 4.1 meq/L (ref 3.5–5.1)
SODIUM: 144 meq/L (ref 135–145)

## 2015-12-11 LAB — LIPID PANEL
CHOL/HDL RATIO: 3
Cholesterol: 148 mg/dL (ref 0–200)
HDL: 44.3 mg/dL (ref >39.00)
LDL Cholesterol: 88 mg/dL (ref 0–99)
NONHDL: 104.1
TRIGLYCERIDES: 82 mg/dL (ref 0.0–149.0)
VLDL: 16.4 mg/dL (ref 0.0–40.0)

## 2015-12-11 LAB — HEPATIC FUNCTION PANEL
ALK PHOS: 66 U/L (ref 39–117)
ALT: 24 U/L (ref 0–35)
AST: 21 U/L (ref 0–37)
Albumin: 4.3 g/dL (ref 3.5–5.2)
BILIRUBIN DIRECT: 0.1 mg/dL (ref 0.0–0.3)
Total Bilirubin: 0.4 mg/dL (ref 0.2–1.2)
Total Protein: 6.4 g/dL (ref 6.0–8.3)

## 2015-12-21 ENCOUNTER — Encounter: Payer: Self-pay | Admitting: Sports Medicine

## 2015-12-21 ENCOUNTER — Ambulatory Visit: Payer: Medicare Other

## 2015-12-21 ENCOUNTER — Ambulatory Visit (INDEPENDENT_AMBULATORY_CARE_PROVIDER_SITE_OTHER): Payer: Medicare Other | Admitting: Sports Medicine

## 2015-12-21 ENCOUNTER — Ambulatory Visit (INDEPENDENT_AMBULATORY_CARE_PROVIDER_SITE_OTHER): Payer: Medicare Other

## 2015-12-21 ENCOUNTER — Ambulatory Visit: Payer: Medicare Other | Admitting: Internal Medicine

## 2015-12-21 DIAGNOSIS — M79671 Pain in right foot: Secondary | ICD-10-CM | POA: Diagnosis not present

## 2015-12-21 DIAGNOSIS — B353 Tinea pedis: Secondary | ICD-10-CM | POA: Diagnosis not present

## 2015-12-21 DIAGNOSIS — M722 Plantar fascial fibromatosis: Secondary | ICD-10-CM

## 2015-12-21 DIAGNOSIS — M79672 Pain in left foot: Secondary | ICD-10-CM

## 2015-12-21 MED ORDER — TRIAMCINOLONE ACETONIDE 10 MG/ML IJ SUSP: 10.0000 mg | Freq: Once | INTRAMUSCULAR | Status: DC

## 2015-12-21 MED ORDER — CLOTRIMAZOLE 1 % EX SOLN: 1.0000 "application " | Freq: Two times a day (BID) | CUTANEOUS | Status: DC

## 2015-12-21 NOTE — Patient Instructions (Signed)

## 2015-12-21 NOTE — Progress Notes (Addendum)
[name], female   DOB: Jun 26, 1941, 75 y.o.   MRN: [id number] Subjective: Sheri Cook is a 75 y.o. female patient presents to office with complaint of heel pain on the right. Patient admits to post static dyskinesia for >1 month in duration. Patient has treated this problem with changing shoes. Patient reports that this treatment has helped a little. Admits that she works on cemented floors and standing all day and moving around tends to leave her  right heel hurting, especially at the end of her shift. Ranks pain 6 out of 10.  Patient is also concerned of scabbing and white skin in between fourth and fifth toe on left foot. No other concerns addressed.  Patient Active Problem List   Diagnosis Date Noted  . Abnormal mammogram 10/14/2015  . Hypercholesterolemia 07/14/2015  . Elevated serum creatinine 07/14/2015  . Hypertension, essential 01/28/2015  . Family history of colon cancer 01/28/2015  . Health care maintenance 01/28/2015    Current Outpatient Prescriptions on File Prior to Visit  Medication Sig Dispense Refill  . losartan-hydrochlorothiazide (HYZAAR) 100-12.5 MG tablet Take 1 tablet by mouth daily. 30 tablet 1  . rosuvastatin (CRESTOR) 10 MG tablet TAKE ONE TABLET BY MOUTH ONCE DAILY 90 tablet 1   No current facility-administered medications on file prior to visit.    No Known Allergies  Objective: Physical Exam General: The patient is alert and oriented x3 in no acute distress.  Dermatology: Skin is warm, dry and supple bilateral lower extremities. Nails 1-10 are normal. There is no erythema, edema, no eccymosis, no open lesions present. Interdigital maceration, left fourth webspace, consistent with tinea pedis. Integument is otherwise unremarkable.  Vascular: Dorsalis Pedis pulse 2/4 and Posterior Tibial pulse are 1/4 bilateral. Capillary fill time is immediate to all digits.  Neurological: Grossly intact to light touch with an achilles reflex of +2/5  and a negative Tinel's sign bilateral.  Musculoskeletal: Tenderness to palpation at the medial calcaneal tubercale and through the insertion of the plantar fascia on the right foot. No pain with compression of calcaneus bilateral. No pain with tuning fork to calcaneus bilateral. No pain with calf compression bilateral. There is decreased Ankle joint range of motion bilateral. All other joints range of motion within normal limits bilateral. Strength 5/5 in all groups bilateral.   Xray, Right foot:  Normal osseous mineralization. Joint spaces preserved. No fracture/dislocation/boney destruction. Pes planus foot type. Hammertoe deformities. Posterior and inferior Calcaneal spurs present with mild thickening of plantar fascia. No other soft tissue abnormalities or radiopaque foreign bodies.   Assessment and Plan: Problem List Items Addressed This Visit    None    Visit Diagnoses    Right foot pain    -  Primary    Relevant Orders    DG Foot 2 Views Right    Plantar fasciitis of right foot        Relevant Medications    triamcinolone acetonide (KENALOG) 10 MG/ML injection 10 mg    Tinea pedis of left foot        4th webspace    Relevant Medications    clotrimazole (LOTRIMIN) 1 % external solution      -Complete examination performed. Discussed with patient in detail the condition of plantar fasciitis, how this occurs and general treatment options. Explained both conservative and surgical treatments.  -After oral consent and aseptic prep, injected a mixture containing 1 ml of 2%  plain lidocaine, 1 ml 0.5% plain marcaine, 0.5 ml of kenalog 10  and 0.5 ml of dexamethasone phosphate into right heel. Post-injection care discussed with patient.  -Recommended good supportive shoes and advised use of OTC insert. Explained to patient that if these orthoses work well, we will continue with these. If these do not improve her condition and  pain, we will consider custom molded orthoses. - Explained in  detail the use of the fascial brace which was dispensed at today's visit. -Explained and dispensed to patient daily stretching exercises. -Recommend patient to ice affected area 1-2x daily. -Prescribed clotrimazole solution to use left fourth webspace daily for tinea pedis -Patient to return to office in 3-4 weeks for follow up or sooner if problems or questions arise.  Landis Martins, DPM

## 2016-01-02 ENCOUNTER — Other Ambulatory Visit: Payer: Self-pay | Admitting: Internal Medicine

## 2016-01-18 ENCOUNTER — Encounter: Payer: Self-pay | Admitting: Sports Medicine

## 2016-01-18 ENCOUNTER — Ambulatory Visit (INDEPENDENT_AMBULATORY_CARE_PROVIDER_SITE_OTHER): Payer: Medicare Other | Admitting: Sports Medicine

## 2016-01-18 DIAGNOSIS — B353 Tinea pedis: Secondary | ICD-10-CM | POA: Diagnosis not present

## 2016-01-18 DIAGNOSIS — M722 Plantar fascial fibromatosis: Secondary | ICD-10-CM | POA: Diagnosis not present

## 2016-01-18 DIAGNOSIS — M79671 Pain in right foot: Secondary | ICD-10-CM | POA: Diagnosis not present

## 2016-01-18 NOTE — Progress Notes (Signed)
[name], female   DOB: February 13, 1941, 75 y.o.   MRN: [id number]  Subjective: Sheri Cook is a 75 y.o. female patient returns to office for follow up evaluation of heel pain on the right and tinea pedis. Patient states that her right heel is much better and that the brace feels good especially at work. Patient states that she is still having moist skin in between 4-5 toes on left foot. No other concerns addressed.  Patient Active Problem List   Diagnosis Date Noted  . Abnormal mammogram 10/14/2015  . Hypercholesterolemia 07/14/2015  . Elevated serum creatinine 07/14/2015  . Hypertension, essential 01/28/2015  . Family history of colon cancer 01/28/2015  . Health care maintenance 01/28/2015    Current Outpatient Prescriptions on File Prior to Visit  Medication Sig Dispense Refill  . clotrimazole (LOTRIMIN) 1 % external solution Apply 1 application topically 2 (two) times daily. In between toes on left foot 30 mL 4  . losartan-hydrochlorothiazide (HYZAAR) 100-12.5 MG tablet TAKE ONE TABLET BY MOUTH ONCE DAILY 30 tablet 0  . rosuvastatin (CRESTOR) 10 MG tablet TAKE ONE TABLET BY MOUTH ONCE DAILY 90 tablet 1   Current Facility-Administered Medications on File Prior to Visit  Medication Dose Route Frequency Provider Last Rate Last Dose  . triamcinolone acetonide (KENALOG) 10 MG/ML injection 10 mg  10 mg Other Once [employer], DPM        No Known Allergies  Objective: Physical Exam General: The patient is alert and oriented x3 in no acute distress.  Dermatology: Skin is warm, dry and supple bilateral lower extremities. Nails 1-10 are normal. There is no erythema, edema, no eccymosis, no open lesions present. Interdigital maceration, left fourth webspace, consistent with tinea pedis. Integument is otherwise unremarkable.  Vascular: Dorsalis Pedis pulse 2/4 and Posterior Tibial pulse are 1/4 bilateral. Capillary fill time is immediate to all digits.  Neurological:  Grossly intact to light touch with an achilles reflex of +2/5 and a negative Tinel's sign bilateral.  Musculoskeletal: No tenderness to palpation at the medial calcaneal tubercale and through the insertion of the plantar fascia on the right foot. No pain with compression of calcaneus bilateral. No pain with tuning fork to calcaneus bilateral. No pain with calf compression bilateral. There is decreased Ankle joint range of motion bilateral. All other joints range of motion within normal limits bilateral. Strength 5/5 in all groups bilateral.   Assessment and Plan: Problem List Items Addressed This Visit    None    Visit Diagnoses    Plantar fasciitis of right foot    -  Primary    Improved    Right foot pain        Tinea pedis of left foot        4th webspace      -Complete examination performed.  -Recommend cont with good supportive shoes and inserts -Cont with fascial brace and slowly wean as instructed -Cont with daily stretching exercises. -Cont with icing as needed. -Cont with clotrimazole solution to use left fourth webspace daily for tinea pedis -Patient to return to office as needed for follow up or sooner if problems or questions arise.  Landis Martins, DPM

## 2016-01-25 ENCOUNTER — Ambulatory Visit (INDEPENDENT_AMBULATORY_CARE_PROVIDER_SITE_OTHER): Payer: Medicare Other | Admitting: Internal Medicine

## 2016-01-25 ENCOUNTER — Encounter: Payer: Self-pay | Admitting: Internal Medicine

## 2016-01-25 VITALS — BP 140/70 | HR 73 | Temp 98.3°F | Resp 18 | Ht 63.75 in | Wt 181.4 lb

## 2016-01-25 DIAGNOSIS — Z800 Family history of malignant neoplasm of digestive organs: Secondary | ICD-10-CM | POA: Diagnosis not present

## 2016-01-25 DIAGNOSIS — I10 Essential (primary) hypertension: Secondary | ICD-10-CM | POA: Diagnosis not present

## 2016-01-25 DIAGNOSIS — E7800 Pure hypercholesterolemia, unspecified: Secondary | ICD-10-CM | POA: Diagnosis not present

## 2016-01-25 DIAGNOSIS — R928 Other abnormal and inconclusive findings on diagnostic imaging of breast: Secondary | ICD-10-CM

## 2016-01-25 NOTE — Progress Notes (Signed)
Pre-visit discussion using our clinic review tool. No additional management support is needed unless otherwise documented below in the visit note.  

## 2016-01-25 NOTE — Progress Notes (Signed)
[name], female   DOB: 03-29-1941, 75 y.o.   MRN: [id number]   [name], female    DOB: 1941/07/31, 75 y.o.   MRN: [id number]  HPI  Patient here for a scheduled follow up.  She is doing well.  Feels good.  Stays active.  No cardiac symptoms with increased activity or exertion.  No sob.  No acid reflux.  No abdominal pain or cramping.  Bowels stable.  Taking her medication.  Doing well.  Seeing podiatry.  Plantar fasciitis.  Supports.  Better.  Scheduled for f/u mammogram.     Past Medical History  Diagnosis Date  . History of chicken pox   . Essential hypertension   . Hypercholesterolemia    Past Surgical History  Procedure Laterality Date  . Cataract surgery      bilateral   Family History  Problem Relation Age of Onset  . Hypertension Mother   . Colon cancer Brother   . Congestive Heart Failure Mother    Social History   Social History  . Marital Status: Single    Spouse Name: N/A  . Number of Children: N/A  . Years of Education: N/A   Social History Main Topics  . Smoking status: Never Smoker   . Smokeless tobacco: Never Used  . Alcohol Use: No  . Drug Use: No  . Sexual Activity: No   Other Topics Concern  . None   Social History Narrative    Outpatient Encounter Prescriptions as of 01/25/2016  Medication Sig  . clotrimazole (LOTRIMIN) 1 % external solution Apply 1 application topically 2 (two) times daily. In between toes on left foot  . losartan-hydrochlorothiazide (HYZAAR) 100-12.5 MG tablet TAKE ONE TABLET BY MOUTH ONCE DAILY  . rosuvastatin (CRESTOR) 10 MG tablet TAKE ONE TABLET BY MOUTH ONCE DAILY   Facility-Administered Encounter Medications as of 01/25/2016  Medication  . triamcinolone acetonide (KENALOG) 10 MG/ML injection 10 mg    Review of Systems  Constitutional: Negative for appetite change and unexpected weight change.  HENT: Negative for congestion and sinus pressure.   Respiratory:  Negative for cough, chest tightness and shortness of breath.   Cardiovascular: Negative for chest pain, palpitations and leg swelling.  Gastrointestinal: Negative for nausea, vomiting, abdominal pain and diarrhea.  Genitourinary: Negative for dysuria and difficulty urinating.  Musculoskeletal: Negative for back pain and joint swelling.  Skin: Negative for color change and rash.  Neurological: Negative for dizziness, light-headedness and headaches.  Psychiatric/Behavioral: Negative for dysphoric mood and agitation.       Objective:     Blood pressure rechecked by me:  136/78  Physical Exam  Constitutional: She appears well-developed and well-nourished. No distress.  HENT:  Nose: Nose normal.  Mouth/Throat: Oropharynx is clear and moist.  Neck: Neck supple. No thyromegaly present.  Cardiovascular: Normal rate and regular rhythm.   Pulmonary/Chest: Breath sounds normal. No respiratory distress. She has no wheezes.  Abdominal: Soft. Bowel sounds are normal. There is no tenderness.  Musculoskeletal: She exhibits no edema or tenderness.  Lymphadenopathy:    She has no cervical adenopathy.  Skin: No rash noted. No erythema.  Psychiatric: She has a normal mood and affect. Her behavior is normal.    BP 140/70 mmHg  Pulse 73  Temp(Src) 98.3 F (36.8 C) (Oral)  Resp 18  Ht 5' 3.75" (1.619 m)  Wt 181 lb 6 oz (82.271 kg)  BMI 31.39 kg/m2  SpO2 94% Wt Readings from Last  3 Encounters:  01/25/16 181 lb 6 oz (82.271 kg)  10/12/15 183 lb (83.008 kg)  07/27/15 178 lb 12.8 oz (81.103 kg)     Lab Results  Component Value Date   WBC 4.0 02/02/2015   HGB 13.4 02/02/2015   HCT 38.9 02/02/2015   PLT 277.0 02/02/2015   GLUCOSE 98 12/11/2015   CHOL 148 12/11/2015   TRIG 82.0 12/11/2015   HDL 44.30 12/11/2015   LDLCALC 88 12/11/2015   ALT 24 12/11/2015   AST 21 12/11/2015   NA 144 12/11/2015   K 4.1 12/11/2015   CL 111 12/11/2015   CREATININE 1.13 12/11/2015   BUN 26* 12/11/2015     CO2 28 12/11/2015   TSH 1.29 02/02/2015    Dg Bone Density  08/13/2015  EXAM: DUAL X-RAY ABSORPTIOMETRY (DXA) FOR BONE MINERAL DENSITY IMPRESSION: Dear Dr. Nicki Reaper, Your patient Sheri Cook completed a BMD test on 08/13/2015 using the [place] (analysis version: 14.10) manufactured by [employer]. The following summarizes the results of our evaluation. PATIENT BIOGRAPHICAL: Name: Sheri, Cook Patient ID: [id number] Birth Date: January 24, 1941 Height: 63.5 in. Gender: Female Exam Date: 08/13/2015 Weight: 179.8 lbs. Indications: Postmenopausal Fractures: Treatments: crestor ASSESSMENT: The BMD measured at Femur Neck Left is 0.910 g/cm2 with a T-score of -0.9. This patient is considered normal according to [place] [employer] [facility]) criteria. Site Region Measured Measured WHO Young Adult BMD Date       Age      Classification T-score AP Spine L1-L4 08/13/2015 74.3 Normal -0.2 1.174 g/cm2 DualFemur Neck Left 08/13/2015 74.3 Normal -0.9 0.910 g/cm2 World Health Organization [facility]) criteria for post-menopausal, Caucasian Women: Normal:       T-score at or above -1 SD Osteopenia:   T-score between -1 and -2.5 SD Osteoporosis: T-score at or below -2.5 SD RECOMMENDATIONS: [place] recommends that FDA-approved medical therapies be considered in postmenopausal women and men age 75 or older with a: 1. Hip or vertebral (clinical or morphometric) fracture. 2. T-score of < -2.5 at the spine or hip. 3. Ten-year fracture probability by FRAX of 3% or greater for hip fracture or 20% or greater for major osteoporotic fracture. All treatment decisions require clinical judgment and consideration of individual patient factors, including patient preferences, co-morbidities, previous drug use, risk factors not captured in the FRAX model (e.g. falls, vitamin D deficiency, increased bone turnover, interval significant decline in bone density) and possible under - or over-estimation of  fracture risk by FRAX. All patients should ensure an adequate intake of dietary calcium (1200 mg/d) and vitamin D (800 IU daily) unless contraindicated. FOLLOW-UP: People with diagnosed cases of osteoporosis or at high risk for fracture should have regular bone mineral density tests. For patients eligible for Medicare, routine testing is allowed once every 2 years. The testing frequency can be increased to one year for patients who have rapidly progressing disease, those who are receiving or discontinuing medical therapy to restore bone mass, or have additional risk factors. I have reviewed this report, and agree with the above findings. [facility] Radiology Electronically Signed   By: Rolm Baptise M.D.   On: 08/13/2015 09:36       Assessment & Plan:   Problem List Items Addressed This Visit    Abnormal mammogram    Scheduled for f/u mammogram in 01/2016.  Last 07/24/15 recommended f/u right breast diagnostic mammogram with 3D in 6 months.        Family history of colon cancer  Needs colonoscopy.        Hypercholesterolemia    On crestor.  LDL 88 on 12/11/15 check.  Tolerating.  Low cholesterol diet and exercise.        Relevant Orders   Lipid panel   Hepatic function panel   Hypertension, essential - Primary    Blood pressure as outlined.  Doing better.  Continue same medication.  Follow.  Follow metabolic panel.       Relevant Orders   CBC with Differential/Platelet   TSH   Basic metabolic panel       Einar Pheasant, MD

## 2016-01-27 ENCOUNTER — Encounter: Payer: Self-pay | Admitting: Internal Medicine

## 2016-01-27 NOTE — Assessment & Plan Note (Signed)
Blood pressure as outlined.  Doing better.  Continue same medication.  Follow.  Follow metabolic panel.

## 2016-01-27 NOTE — Assessment & Plan Note (Signed)
Scheduled for f/u mammogram in 01/2016.  Last 07/24/15 recommended f/u right breast diagnostic mammogram with 3D in 6 months.

## 2016-01-27 NOTE — Assessment & Plan Note (Signed)
On crestor.  LDL 88 on 12/11/15 check.  Tolerating.  Low cholesterol diet and exercise.

## 2016-01-27 NOTE — Assessment & Plan Note (Signed)
Needs colonoscopy 

## 2016-02-04 ENCOUNTER — Ambulatory Visit
Admission: RE | Admit: 2016-02-04 | Discharge: 2016-02-04 | Disposition: A | Discharge status: Home / Self Care | Payer: Medicare Other | Source: Ambulatory Visit | Attending: Internal Medicine | Admitting: Internal Medicine

## 2016-02-04 ENCOUNTER — Other Ambulatory Visit: Payer: Self-pay | Admitting: Internal Medicine

## 2016-02-04 DIAGNOSIS — R922 Inconclusive mammogram: Secondary | ICD-10-CM | POA: Diagnosis not present

## 2016-02-04 DIAGNOSIS — R928 Other abnormal and inconclusive findings on diagnostic imaging of breast: Secondary | ICD-10-CM

## 2016-02-04 DIAGNOSIS — N6489 Other specified disorders of breast: Secondary | ICD-10-CM | POA: Diagnosis not present

## 2016-02-05 ENCOUNTER — Other Ambulatory Visit: Payer: Self-pay | Admitting: Internal Medicine

## 2016-02-05 DIAGNOSIS — R928 Other abnormal and inconclusive findings on diagnostic imaging of breast: Secondary | ICD-10-CM

## 2016-02-05 NOTE — Progress Notes (Signed)
Order placed for f/u diagnostic mammogram.   

## 2016-02-06 ENCOUNTER — Encounter: Payer: Self-pay | Admitting: Internal Medicine

## 2016-03-12 DIAGNOSIS — H40023 Open angle with borderline findings, high risk, bilateral: Secondary | ICD-10-CM | POA: Diagnosis not present

## 2016-04-10 ENCOUNTER — Other Ambulatory Visit (INDEPENDENT_AMBULATORY_CARE_PROVIDER_SITE_OTHER): Payer: Medicare Other

## 2016-04-10 DIAGNOSIS — E7800 Pure hypercholesterolemia, unspecified: Secondary | ICD-10-CM

## 2016-04-10 DIAGNOSIS — I10 Essential (primary) hypertension: Secondary | ICD-10-CM

## 2016-04-10 LAB — CBC WITH DIFFERENTIAL/PLATELET
BASOS PCT: 0.4 % (ref 0.0–3.0)
Basophils Absolute: 0 10*3/uL (ref 0.0–0.1)
EOS PCT: 3.5 % (ref 0.0–5.0)
Eosinophils Absolute: 0.1 10*3/uL (ref 0.0–0.7)
HCT: 36.2 % (ref 36.0–46.0)
Hemoglobin: 12.2 g/dL (ref 12.0–15.0)
LYMPHS ABS: 1.2 10*3/uL (ref 0.7–4.0)
Lymphocytes Relative: 30.5 % (ref 12.0–46.0)
MCHC: 33.6 g/dL (ref 30.0–36.0)
MCV: 89.8 fl (ref 78.0–100.0)
MONO ABS: 0.3 10*3/uL (ref 0.1–1.0)
Monocytes Relative: 8.2 % (ref 3.0–12.0)
NEUTROS ABS: 2.3 10*3/uL (ref 1.4–7.7)
NEUTROS PCT: 57.4 % (ref 43.0–77.0)
PLATELETS: 249 10*3/uL (ref 150.0–400.0)
RBC: 4.03 Mil/uL (ref 3.87–5.11)
RDW: 14.8 % (ref 11.5–15.5)
WBC: 4 10*3/uL (ref 4.0–10.5)

## 2016-04-10 LAB — LIPID PANEL
CHOL/HDL RATIO: 3
Cholesterol: 147 mg/dL (ref 0–200)
HDL: 44.2 mg/dL (ref >39.00)
LDL Cholesterol: 86 mg/dL (ref 0–99)
NONHDL: 103.16
Triglycerides: 88 mg/dL (ref 0.0–149.0)
VLDL: 17.6 mg/dL (ref 0.0–40.0)

## 2016-04-10 LAB — BASIC METABOLIC PANEL
BUN: 29 mg/dL — ABNORMAL HIGH (ref 6–23)
CALCIUM: 9.8 mg/dL (ref 8.4–10.5)
CHLORIDE: 109 meq/L (ref 96–112)
CO2: 28 meq/L (ref 19–32)
CREATININE: 1.17 mg/dL (ref 0.40–1.20)
GFR: 58 mL/min — ABNORMAL LOW (ref >60.00)
GLUCOSE: 100 mg/dL — AB (ref 70–99)
Potassium: 4.3 mEq/L (ref 3.5–5.1)
Sodium: 141 mEq/L (ref 135–145)

## 2016-04-10 LAB — TSH: TSH: 1.54 u[IU]/mL (ref 0.35–4.50)

## 2016-04-10 LAB — HEPATIC FUNCTION PANEL
ALK PHOS: 61 U/L (ref 39–117)
ALT: 26 U/L (ref 0–35)
AST: 20 U/L (ref 0–37)
Albumin: 4.1 g/dL (ref 3.5–5.2)
BILIRUBIN DIRECT: 0.1 mg/dL (ref 0.0–0.3)
BILIRUBIN TOTAL: 0.4 mg/dL (ref 0.2–1.2)
Total Protein: 6.7 g/dL (ref 6.0–8.3)

## 2016-04-11 ENCOUNTER — Encounter: Payer: Self-pay | Admitting: *Deleted

## 2016-04-16 ENCOUNTER — Other Ambulatory Visit: Payer: Self-pay | Admitting: Internal Medicine

## 2016-05-01 ENCOUNTER — Encounter: Payer: Self-pay | Admitting: Internal Medicine

## 2016-05-01 ENCOUNTER — Ambulatory Visit (INDEPENDENT_AMBULATORY_CARE_PROVIDER_SITE_OTHER): Payer: Medicare Other | Admitting: Internal Medicine

## 2016-05-01 VITALS — BP 182/88 | HR 64 | Temp 97.9°F | Resp 18 | Ht 63.75 in | Wt 188.4 lb

## 2016-05-01 DIAGNOSIS — R748 Abnormal levels of other serum enzymes: Secondary | ICD-10-CM | POA: Diagnosis not present

## 2016-05-01 DIAGNOSIS — I10 Essential (primary) hypertension: Secondary | ICD-10-CM

## 2016-05-01 DIAGNOSIS — R928 Other abnormal and inconclusive findings on diagnostic imaging of breast: Secondary | ICD-10-CM | POA: Diagnosis not present

## 2016-05-01 DIAGNOSIS — E7800 Pure hypercholesterolemia, unspecified: Secondary | ICD-10-CM | POA: Diagnosis not present

## 2016-05-01 DIAGNOSIS — R7989 Other specified abnormal findings of blood chemistry: Secondary | ICD-10-CM

## 2016-05-01 NOTE — Progress Notes (Signed)
Pre-visit discussion using our clinic review tool. No additional management support is needed unless otherwise documented below in the visit note.  

## 2016-05-01 NOTE — Progress Notes (Signed)
[name], female   DOB: [birth year], 75 y.o.   MRN: [id number]   [name], female    DOB: [birth year], 75 y.o.   MRN: [id number]  HPI  Patient here for a scheduled follow up.  States she feels good.  Stays active.  No chest pain.  No sob.  No acid reflux.  No abdominal pain or cramping.  Bowel stable.     Past Medical History  Diagnosis Date  . History of chicken pox   . Essential hypertension   . Hypercholesterolemia    Past Surgical History  Procedure Laterality Date  . Cataract surgery      bilateral   Family History  Problem Relation Age of Onset  . Hypertension Mother   . Colon cancer Brother   . Congestive Heart Failure Mother    Social History   Social History  . Marital Status: Single    Spouse Name: N/A  . Number of Children: N/A  . Years of Education: N/A   Social History Main Topics  . Smoking status: Never Smoker   . Smokeless tobacco: Never Used  . Alcohol Use: No  . Drug Use: No  . Sexual Activity: No   Other Topics Concern  . None   Social History Narrative    Outpatient Encounter Prescriptions as of 05/01/2016  Medication Sig  . clotrimazole (LOTRIMIN) 1 % external solution Apply 1 application topically 2 (two) times daily. In between toes on left foot  . losartan-hydrochlorothiazide (HYZAAR) 100-12.5 MG tablet TAKE ONE TABLET BY MOUTH ONCE DAILY  . rosuvastatin (CRESTOR) 10 MG tablet TAKE ONE TABLET BY MOUTH ONCE DAILY   Facility-Administered Encounter Medications as of 05/01/2016  Medication  . triamcinolone acetonide (KENALOG) 10 MG/ML injection 10 mg    Review of Systems  Constitutional: Negative for appetite change and unexpected weight change.  HENT: Negative for congestion and sinus pressure.   Respiratory: Negative for cough, chest tightness and shortness of breath.   Cardiovascular: Negative for chest pain, palpitations and leg swelling.  Gastrointestinal: Negative for nausea,  vomiting, abdominal pain and diarrhea.  Genitourinary: Negative for dysuria and difficulty urinating.  Musculoskeletal: Negative for back pain and joint swelling.  Skin: Negative for color change and rash.  Neurological: Negative for dizziness, light-headedness and headaches.  Psychiatric/Behavioral: Negative for dysphoric mood and agitation.       Objective:     Blood pressure rechecked by me:  138-140/72  Physical Exam  Constitutional: She appears well-developed and well-nourished. No distress.  HENT:  Nose: Nose normal.  Mouth/Throat: Oropharynx is clear and moist.  Neck: Neck supple. No thyromegaly present.  Cardiovascular: Normal rate and regular rhythm.   Pulmonary/Chest: Breath sounds normal. No respiratory distress. She has no wheezes.  Abdominal: Soft. Bowel sounds are normal. There is no tenderness.  Musculoskeletal: She exhibits no edema or tenderness.  Lymphadenopathy:    She has no cervical adenopathy.  Skin: No rash noted. No erythema.  Psychiatric: She has a normal mood and affect. Her behavior is normal.    BP 182/88 mmHg  Pulse 64  Temp(Src) 97.9 F (36.6 C) (Oral)  Resp 18  Ht 5' 3.75" (1.619 m)  Wt 188 lb 6 oz (85.446 kg)  BMI 32.60 kg/m2  SpO2 99% Wt Readings from Last 3 Encounters:  05/01/16 188 lb 6 oz (85.446 kg)  01/25/16 181 lb 6 oz (82.271 kg)  10/12/15 183 lb (83.008 kg)     Lab Results  Component Value Date   WBC 4.0 04/10/2016   HGB 12.2 04/10/2016   HCT 36.2 04/10/2016   PLT 249.0 04/10/2016   GLUCOSE 100* 04/10/2016   CHOL 147 04/10/2016   TRIG 88.0 04/10/2016   HDL 44.20 04/10/2016   LDLCALC 86 04/10/2016   ALT 26 04/10/2016   AST 20 04/10/2016   NA 141 04/10/2016   K 4.3 04/10/2016   CL 109 04/10/2016   CREATININE 1.17 04/10/2016   BUN 29* 04/10/2016   CO2 28 04/10/2016   TSH 1.54 04/10/2016    US Breast Hume Axilla  02/04/2016  CLINICAL DATA:  Patient for short-term follow-up probably benign right  breast asymmetry. EXAM: 2D DIGITAL DIAGNOSTIC RIGHT MAMMOGRAM WITH CAD AND ADJUNCT TOMO ULTRASOUND RIGHT BREAST COMPARISON:  Previous exam(s). ACR Breast Density Category c: The breast tissue is heterogeneously dense, which may obscure small masses. FINDINGS: Grossly unchanged fibroglandular pattern within the upper-outer right breast. No definite suspicious masses, calcifications or architectural distortion identified. Mammographic images were processed with CAD. On physical exam, I palpate no discrete mass within the upper-outer right breast. Targeted ultrasound is performed, showing dense tissue without suspicious mass within the upper-outer right breast. IMPRESSION: Stable probably benign right breast asymmetry, favored to represent dense fibroglandular breast tissue. RECOMMENDATION: Bilateral diagnostic mammography in 5 months (06/2016). I have discussed the findings and recommendations with the patient. Results were also provided in writing at the conclusion of the visit. If applicable, a reminder letter will be sent to the patient regarding the next appointment. BI-RADS CATEGORY  3: Probably benign. Electronically Signed   By: Lovey Newcomer M.D.   On: 02/04/2016 13:29   Mm Diag Breast Tomo Uni Right  02/04/2016  CLINICAL DATA:  Patient for short-term follow-up probably benign right breast asymmetry. EXAM: 2D DIGITAL DIAGNOSTIC RIGHT MAMMOGRAM WITH CAD AND ADJUNCT TOMO ULTRASOUND RIGHT BREAST COMPARISON:  Previous exam(s). ACR Breast Density Category c: The breast tissue is heterogeneously dense, which may obscure small masses. FINDINGS: Grossly unchanged fibroglandular pattern within the upper-outer right breast. No definite suspicious masses, calcifications or architectural distortion identified. Mammographic images were processed with CAD. On physical exam, I palpate no discrete mass within the upper-outer right breast. Targeted ultrasound is performed, showing dense tissue without suspicious mass within  the upper-outer right breast. IMPRESSION: Stable probably benign right breast asymmetry, favored to represent dense fibroglandular breast tissue. RECOMMENDATION: Bilateral diagnostic mammography in 5 months (06/2016). I have discussed the findings and recommendations with the patient. Results were also provided in writing at the conclusion of the visit. If applicable, a reminder letter will be sent to the patient regarding the next appointment. BI-RADS CATEGORY  3: Probably benign. Electronically Signed   By: Lovey Newcomer M.D.   On: 02/04/2016 13:29       Assessment & Plan:   Problem List Items Addressed This Visit    Abnormal mammogram    01/2016 mammogram as outlined.  Due f/u in 06/2016.        Elevated serum creatinine    Stay hydrated.    Lab Results  Component Value Date   CREATININE 1.17 04/10/2016        Hypercholesterolemia    On crestor.  Low cholesterol diet and exercise.  Follow  Lipid panel and liver function tests.    Lab Results  Component Value Date   CHOL 147 04/10/2016   HDL 44.20 04/10/2016   LDLCALC 86 04/10/2016   TRIG 88.0 04/10/2016   CHOLHDL 3 04/10/2016  Relevant Orders   Lipid panel   Hepatic function panel   Hypertension, essential - Primary    Blood pressure on recheck 138-140/72.  Discussed adjusting medication and adding additional medication.  She declines.  Follow pressure.  Check metabolic panel.        Relevant Orders   Basic metabolic panel       Einar Pheasant, MD

## 2016-05-04 ENCOUNTER — Encounter: Payer: Self-pay | Admitting: Internal Medicine

## 2016-05-04 NOTE — Assessment & Plan Note (Addendum)
On crestor.  Low cholesterol diet and exercise.  Follow  Lipid panel and liver function tests.    Lab Results  Component Value Date   CHOL 147 04/10/2016   HDL 44.20 04/10/2016   LDLCALC 86 04/10/2016   TRIG 88.0 04/10/2016   CHOLHDL 3 04/10/2016

## 2016-05-04 NOTE — Assessment & Plan Note (Signed)
Blood pressure on recheck 138-140/72.  Discussed adjusting medication and adding additional medication.  She declines.  Follow pressure.  Check metabolic panel.

## 2016-05-04 NOTE — Assessment & Plan Note (Signed)
01/2016 mammogram as outlined.  Due f/u in 06/2016.

## 2016-05-04 NOTE — Assessment & Plan Note (Signed)
Stay hydrated.    Lab Results  Component Value Date   CREATININE 1.17 04/10/2016

## 2016-06-02 IMAGING — MG MM DIGITAL SCREENING BILATERAL
5 series · 5 of 5 positions shown · non-contrast
Comparison: None.

CLINICAL DATA: Screening. Baseline examination.

EXAM:
DIGITAL SCREENING BILATERAL MAMMOGRAM WITH CAD

[R MLO (1 of 2)]
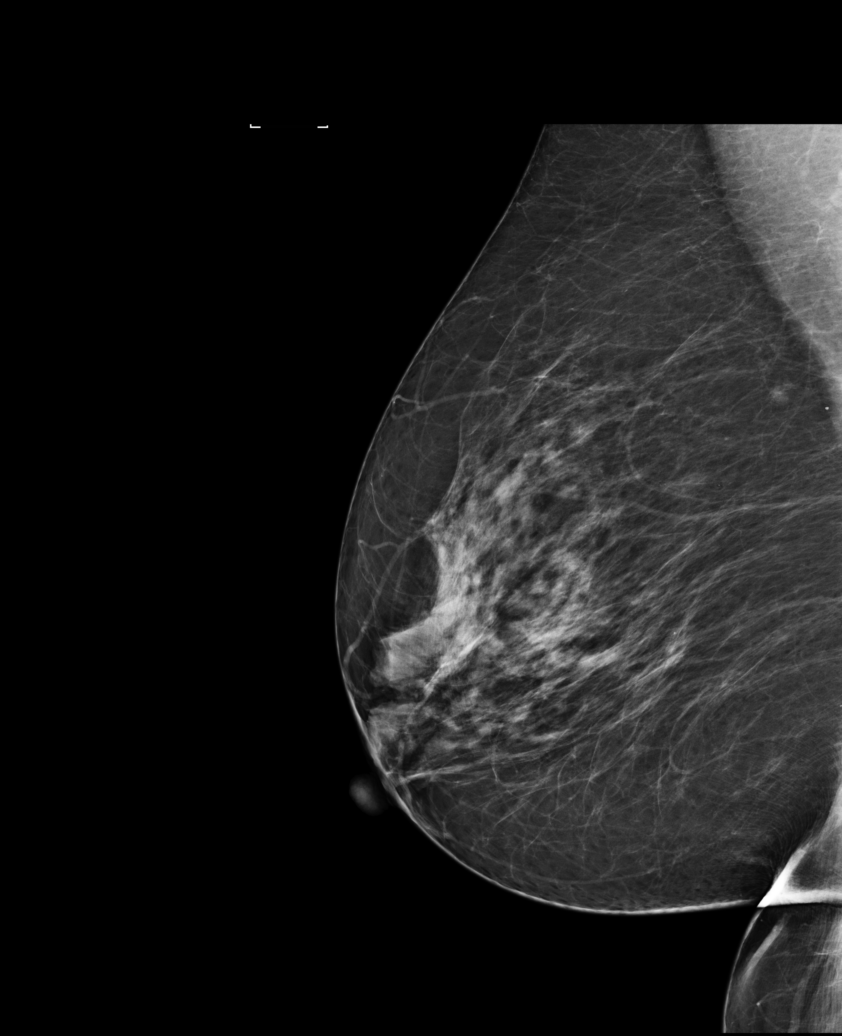

[L MLO]
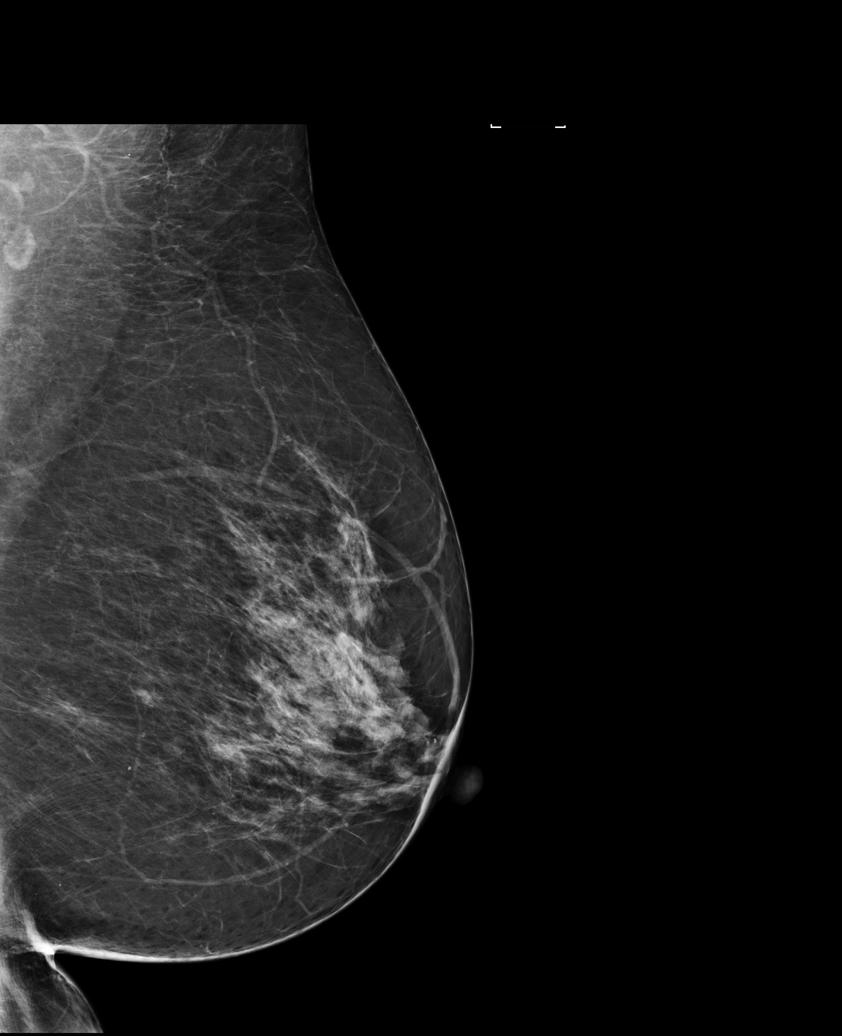

[R CC]
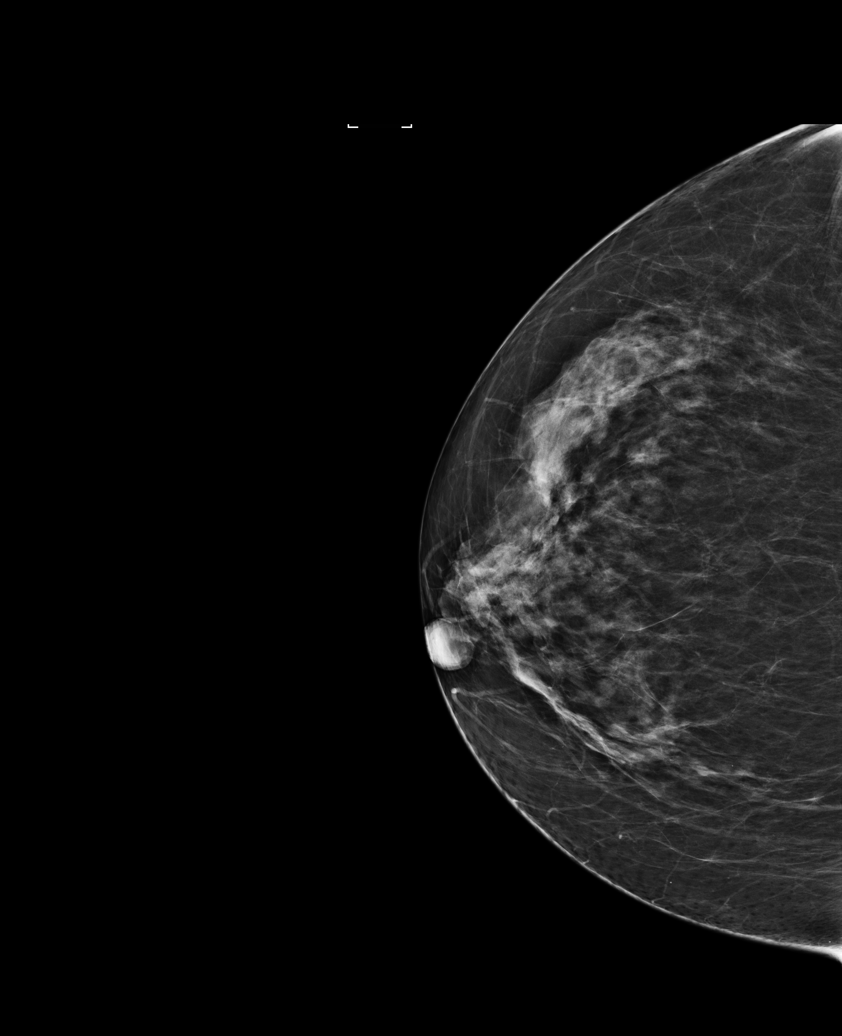

[R MLO (2 of 2)]
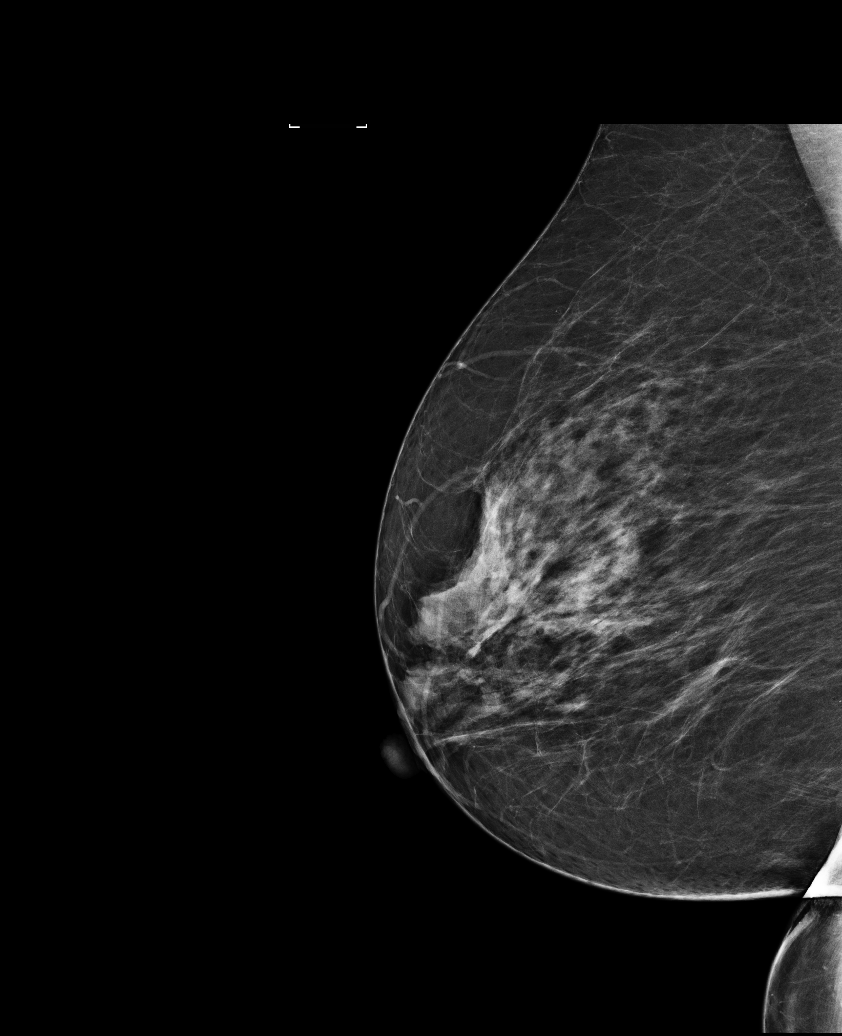

[L CC]
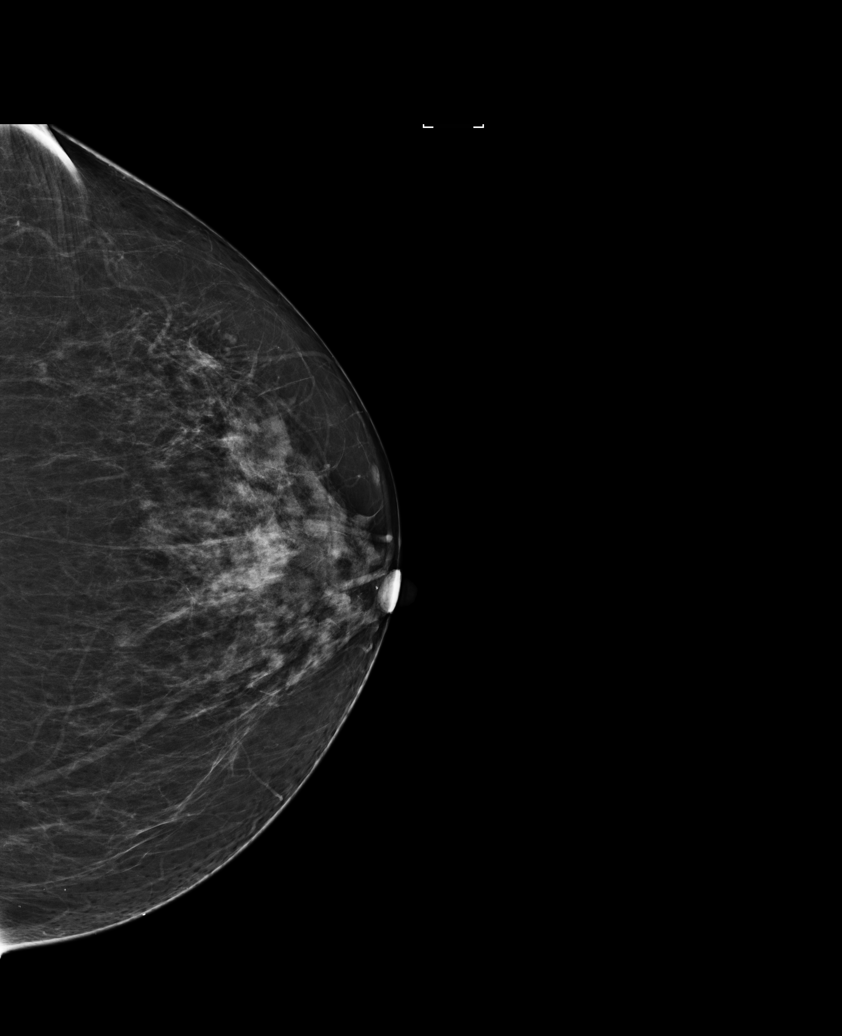

[5 of 5 positions shown; findings below may reference images not displayed]

ACR Breast Density Category c: The breast tissue is heterogeneously
dense, which may obscure small masses.
FINDINGS: In the right breast, a possible asymmetry warrants further
evaluation. In the left breast, no findings suspicious for
malignancy. Images were processed with CAD.
IMPRESSION: Further evaluation is suggested for possible asymmetry in the right
breast.

RECOMMENDATION:
Diagnostic mammogram and possibly ultrasound of the right breast.
(Code:[id number])

The patient will be contacted regarding the findings, and additional
imaging will be scheduled.

BI-RADS CATEGORY  0: Incomplete. Need additional imaging evaluation
and/or prior mammograms for comparison.

## 2016-07-07 ENCOUNTER — Other Ambulatory Visit: Payer: Self-pay

## 2016-07-07 MED ORDER — LOSARTAN POTASSIUM-HCTZ 100-12.5 MG PO TABS: 1.0000 | ORAL_TABLET | Freq: Every day | ORAL | 1 refills | Status: DC

## 2016-07-21 ENCOUNTER — Other Ambulatory Visit: Payer: Self-pay | Admitting: Internal Medicine

## 2016-07-24 ENCOUNTER — Ambulatory Visit
Admission: RE | Admit: 2016-07-24 | Discharge: 2016-07-24 | Disposition: A | Discharge status: Home / Self Care | Payer: Medicare Other | Source: Ambulatory Visit | Attending: Internal Medicine | Admitting: Internal Medicine

## 2016-07-24 ENCOUNTER — Other Ambulatory Visit: Payer: Self-pay | Admitting: Internal Medicine

## 2016-07-24 DIAGNOSIS — R928 Other abnormal and inconclusive findings on diagnostic imaging of breast: Secondary | ICD-10-CM | POA: Insufficient documentation

## 2016-07-28 ENCOUNTER — Ambulatory Visit (INDEPENDENT_AMBULATORY_CARE_PROVIDER_SITE_OTHER): Payer: Medicare Other

## 2016-07-28 VITALS — BP 160/70 | HR 60 | Temp 97.7°F | Resp 14 | Ht 64.0 in | Wt 187.4 lb

## 2016-07-28 DIAGNOSIS — Z0000 Encounter for general adult medical examination without abnormal findings: Secondary | ICD-10-CM

## 2016-07-28 DIAGNOSIS — Z23 Encounter for immunization: Secondary | ICD-10-CM

## 2016-07-28 NOTE — Progress Notes (Signed)
Subjective:   Sheri Cook is a 75 y.o. female who presents for Medicare Annual (Subsequent) preventive examination.  Review of Systems:  No ROS.  Medicare Wellness Visit.  Cardiac Risk Factors include: advanced age (>34men, >30 women);hypertension     Objective:     Vitals: BP (!) 160/70 (BP Location: Left Arm, Patient Position: Sitting, Cuff Size: Normal)   Pulse 60   Temp 97.7 F (36.5 C) (Oral)   Resp 14   Ht 5\' 4"  (1.626 m)   Wt 187 lb 6.4 oz (85 kg)   SpO2 99%   BMI 32.17 kg/m   Body mass index is 32.17 kg/m.   Tobacco History  Smoking Status  . Never Smoker  Smokeless Tobacco  . Never Used     Counseling given: Not Answered   Past Medical History:  Diagnosis Date  . Essential hypertension   . History of chicken pox   . Hypercholesterolemia    Past Surgical History:  Procedure Laterality Date  . cataract surgery     bilateral   Family History  Problem Relation Age of Onset  . Hypertension Mother   . Congestive Heart Failure Mother   . Colon cancer Brother    History  Sexual Activity  . Sexual activity: No    Outpatient Encounter Prescriptions as of 07/28/2016  Medication Sig  . clotrimazole (LOTRIMIN) 1 % external solution Apply 1 application topically 2 (two) times daily. In between toes on left foot  . losartan-hydrochlorothiazide (HYZAAR) 100-12.5 MG tablet Take 1 tablet by mouth daily.  . rosuvastatin (CRESTOR) 10 MG tablet TAKE ONE TABLET BY MOUTH ONCE DAILY   Facility-Administered Encounter Medications as of 07/28/2016  Medication  . triamcinolone acetonide (KENALOG) 10 MG/ML injection 10 mg    Activities of Daily Living In your present state of health, do you have any difficulty performing the following activities: 07/28/2016  Hearing? N  Vision? N  Difficulty concentrating or making decisions? N  Walking or climbing stairs? N  Dressing or bathing? N  Doing errands, shopping? N  Preparing Food and eating ? N  Using the  Toilet? N  In the past six months, have you accidently leaked urine? N  Do you have problems with loss of bowel control? N  Managing your Medications? N  Managing your Finances? N  Housekeeping or managing your Housekeeping? N  Some recent data might be hidden    Patient Care Team: Sheri Pheasant, MD as PCP - General (Internal Medicine)    Assessment:    This is a routine wellness examination for Sheri Cook. The goal of the wellness visit is to assist the patient how to close the gaps in care and create a preventative care plan for the patient.   Osteoporosis risk reviewed.  Medications reviewed; taking without issues or barriers.  HTN; followed by PCP and controlled with medication.  Hypertensive medication not yet taken today.   Safety issues reviewed; smoke detectors in the home. No firearms in the home. Wears seatbelts when driving or riding with others. No violence in the home.  No identified risk were noted; The patient was oriented x 3; appropriate in dress and manner and no objective failures at ADL's or IADL's.   Body mass index; discussed the importance of a healthy diet, water intake and exercise. Educational material provided.  High dose influenza vaccine administered R deltoid, tolerated well.  Educational material provided.    Pneumovax 23 vaccine postponed for upcoming visit with PCP in 2  Weeks,  per patient request.  She prefers to wait due to receiving high dose flu vaccine today.   Patient Concerns: Discuss COLONOSCOPY readiness with PCP.  Follow up on file.  Exercise Activities and Dietary recommendations Current Exercise Habits: Structured exercise class, Type of exercise: treadmill;calisthenics, Time (Minutes): 60, Frequency (Times/Week): 3, Weekly Exercise (Minutes/Week): 180, Intensity: Mild  Goals    . Increase lean proteins          Make healthier choices when dining out and cooking at home.  Choose lean meats, fresh vegetables and fruits.           Fall Risk Fall Risk  07/28/2016 07/27/2015 07/13/2015 01/26/2015  Falls in the past year? No Yes Yes No  Number falls in past yr: - 1 1 -  Injury with Fall? - No No -  Follow up - Education provided;Falls prevention discussed - -   Depression Screen PHQ 2/9 Scores 07/28/2016 07/27/2015 07/13/2015 01/26/2015  PHQ - 2 Score 0 0 0 0     Cognitive Testing MMSE - Mini Mental State Exam 07/28/2016 07/27/2015  Orientation to time 5 5  Orientation to Place 5 5  Registration 3 3  Attention/ Calculation 5 5  Recall 2 3  Language- name 2 objects 2 2  Language- repeat 1 1  Language- follow 3 step command 3 3  Language- read & follow direction 1 1  Write a sentence 1 1  Copy design 1 1  Total score 29 30    Immunization History  Administered Date(s) Administered  . Influenza, High Dose Seasonal PF 07/28/2016  . Influenza-Unspecified 08/08/2014, 07/13/2015  . Pneumococcal Conjugate-13 07/27/2015   Screening Tests Health Maintenance  Topic Date Due  . COLONOSCOPY  04/23/1991  . PNA vac Low Risk Adult (2 of 2 - PPSV23) 07/26/2016  . ZOSTAVAX  07/28/2016 (Originally 04/22/2001)  . TETANUS/TDAP  07/28/2016 (Originally 04/22/1960)  . MAMMOGRAM  07/24/2017  . INFLUENZA VACCINE  Completed  . DEXA SCAN  Completed      Plan:    End of life planning; Advance aging; Advanced directives discussed. Copy of current HCPOA/Living Will on file.   Medicare Attestation I have personally reviewed: The patient's medical and social history Their use of alcohol, tobacco or illicit drugs Their current medications and supplements The patient's functional ability including ADLs,fall risks, home safety risks, cognitive, and hearing and visual impairment Diet and physical activities Evidence for depression   The patient's weight, height, BMI, and visual acuity have been recorded in the chart.  I have made referrals and provided education to the patient based on review of the above and I have provided the  patient with a written personalized care plan for preventive services.    During the course of the visit the patient was educated and counseled about the following appropriate screening and preventive services:   Vaccines to include Pneumoccal, Influenza, Hepatitis B, Td, Zostavax, HCV  Electrocardiogram  Cardiovascular Disease  Colorectal cancer screening  Bone density screening  Diabetes screening  Glaucoma screening  Mammography/PAP  Nutrition counseling   Patient Instructions (the written plan) was given to the patient.   Varney Biles, LPN  [id number]   Reviewed.  Agree with assessment and plan.   Dr Nicki Reaper

## 2016-07-28 NOTE — Patient Instructions (Addendum)
  [name] , Thank you for taking time to come for your Medicare Wellness Visit. I appreciate your ongoing commitment to your health goals. Please review the following plan we discussed and let me know if I can assist you in the future.   Return in 2 weeks for scheduled labs and follow up with PCP.  PNEUMOVAX 23 VACCINE DUE AT UPCOMING VISIT.  These are the goals we discussed: Goals    . Increase lean proteins          Make healthier choices when dining out and cooking at home.  Choose lean meats, fresh vegetables and fruits.          This is a list of the screening recommended for you and due dates:  Health Maintenance  Topic Date Due  . Colon Cancer Screening  04/23/1991  . Pneumonia vaccines (2 of 2 - PPSV23) 07/26/2016  . Shingles Vaccine  07/28/2016*  . Tetanus Vaccine  07/28/2016*  . Mammogram  07/24/2017  . Flu Shot  Completed  . DEXA scan (bone density measurement)  Completed  *Topic was postponed. The date shown is not the original due date.

## 2016-08-07 ENCOUNTER — Other Ambulatory Visit (INDEPENDENT_AMBULATORY_CARE_PROVIDER_SITE_OTHER): Payer: Medicare Other

## 2016-08-07 DIAGNOSIS — E7800 Pure hypercholesterolemia, unspecified: Secondary | ICD-10-CM

## 2016-08-07 DIAGNOSIS — I10 Essential (primary) hypertension: Secondary | ICD-10-CM

## 2016-08-07 LAB — LIPID PANEL
Cholesterol: 136 mg/dL (ref 0–200)
HDL: 44.3 mg/dL (ref >39.00)
LDL Cholesterol: 78 mg/dL (ref 0–99)
NonHDL: 91.71
TRIGLYCERIDES: 70 mg/dL (ref 0.0–149.0)
Total CHOL/HDL Ratio: 3
VLDL: 14 mg/dL (ref 0.0–40.0)

## 2016-08-07 LAB — BASIC METABOLIC PANEL
BUN: 30 mg/dL — ABNORMAL HIGH (ref 6–23)
CALCIUM: 10 mg/dL (ref 8.4–10.5)
CO2: 28 mEq/L (ref 19–32)
CREATININE: 1.24 mg/dL — AB (ref 0.40–1.20)
Chloride: 109 mEq/L (ref 96–112)
GFR: 54.19 mL/min — AB (ref >60.00)
Glucose, Bld: 103 mg/dL — ABNORMAL HIGH (ref 70–99)
Potassium: 4.2 mEq/L (ref 3.5–5.1)
Sodium: 143 mEq/L (ref 135–145)

## 2016-08-07 LAB — HEPATIC FUNCTION PANEL
ALBUMIN: 4.1 g/dL (ref 3.5–5.2)
ALK PHOS: 64 U/L (ref 39–117)
ALT: 26 U/L (ref 0–35)
AST: 21 U/L (ref 0–37)
Bilirubin, Direct: 0 mg/dL (ref 0.0–0.3)
TOTAL PROTEIN: 6.7 g/dL (ref 6.0–8.3)
Total Bilirubin: 0.4 mg/dL (ref 0.2–1.2)

## 2016-08-11 ENCOUNTER — Ambulatory Visit (INDEPENDENT_AMBULATORY_CARE_PROVIDER_SITE_OTHER): Payer: Medicare Other | Admitting: Internal Medicine

## 2016-08-11 ENCOUNTER — Encounter: Payer: Self-pay | Admitting: Internal Medicine

## 2016-08-11 VITALS — BP 150/80 | HR 69 | Temp 98.7°F | Ht 64.0 in | Wt 187.2 lb

## 2016-08-11 DIAGNOSIS — R928 Other abnormal and inconclusive findings on diagnostic imaging of breast: Secondary | ICD-10-CM | POA: Diagnosis not present

## 2016-08-11 DIAGNOSIS — E7800 Pure hypercholesterolemia, unspecified: Secondary | ICD-10-CM | POA: Diagnosis not present

## 2016-08-11 DIAGNOSIS — Z800 Family history of malignant neoplasm of digestive organs: Secondary | ICD-10-CM

## 2016-08-11 DIAGNOSIS — R7989 Other specified abnormal findings of blood chemistry: Secondary | ICD-10-CM

## 2016-08-11 DIAGNOSIS — I10 Essential (primary) hypertension: Secondary | ICD-10-CM

## 2016-08-11 DIAGNOSIS — Z0000 Encounter for general adult medical examination without abnormal findings: Secondary | ICD-10-CM | POA: Diagnosis not present

## 2016-08-11 DIAGNOSIS — Z1211 Encounter for screening for malignant neoplasm of colon: Secondary | ICD-10-CM

## 2016-08-11 NOTE — Progress Notes (Signed)
Patient ID: Francis Poinsett, female   DOB: [birth year], 75 y.o.   MRN: [id number]   Subjective:    Patient ID: Rosaida Ryant, female    DOB: January 23, 1941, 75 y.o.   MRN: [id number]  HPI  Patient here for her physical exam.  She states she is doing well.  Stays active.  No cardiac symptoms with increased activity or exertion.  No sob.  No acid reflux.  No abdominal pain or cramping.  Bowels stable.  Discussed need for colon cancer screening,  She is agreeable to referral.  She is working.  Goes to the gym and exercises.     Past Medical History:  Diagnosis Date  . Essential hypertension   . History of chicken pox   . Hypercholesterolemia    Past Surgical History:  Procedure Laterality Date  . cataract surgery     bilateral   Family History  Problem Relation Age of Onset  . Hypertension Mother   . Congestive Heart Failure Mother   . Colon cancer Brother    Social History   Social History  . Marital status: Single    Spouse name: N/A  . Number of children: N/A  . Years of education: N/A   Social History Main Topics  . Smoking status: Never Smoker  . Smokeless tobacco: Never Used  . Alcohol use No  . Drug use: No  . Sexual activity: No   Other Topics Concern  . None   Social History Narrative  . None    Outpatient Encounter Prescriptions as of 08/11/2016  Medication Sig  . clotrimazole (LOTRIMIN) 1 % external solution Apply 1 application topically 2 (two) times daily. In between toes on left foot  . losartan-hydrochlorothiazide (HYZAAR) 100-12.5 MG tablet Take 1 tablet by mouth daily.  . rosuvastatin (CRESTOR) 10 MG tablet TAKE ONE TABLET BY MOUTH ONCE DAILY   Facility-Administered Encounter Medications as of 08/11/2016  Medication  . triamcinolone acetonide (KENALOG) 10 MG/ML injection 10 mg    Review of Systems  Constitutional: Negative for appetite change and unexpected weight change.  HENT: Negative for congestion and sinus pressure.   Eyes: Negative for  pain and visual disturbance.  Respiratory: Negative for cough, chest tightness and shortness of breath.   Cardiovascular: Negative for chest pain, palpitations and leg swelling.  Gastrointestinal: Negative for abdominal pain, diarrhea, nausea and vomiting.  Genitourinary: Negative for difficulty urinating and dysuria.  Musculoskeletal: Negative for back pain and joint swelling.  Skin: Negative for color change and rash.  Neurological: Negative for dizziness, light-headedness and headaches.  Hematological: Negative for adenopathy. Does not bruise/bleed easily.  Psychiatric/Behavioral: Negative for agitation and dysphoric mood.       Objective:     Blood pressure rechecked by me:  140/72  Physical Exam  Constitutional: She is oriented to person, place, and time. She appears well-developed and well-nourished. No distress.  HENT:  Nose: Nose normal.  Mouth/Throat: Oropharynx is clear and moist.  Eyes: Right eye exhibits no discharge. Left eye exhibits no discharge. No scleral icterus.  Neck: Neck supple. No thyromegaly present.  Cardiovascular: Normal rate and regular rhythm.   Pulmonary/Chest: Breath sounds normal. No accessory muscle usage. No tachypnea. No respiratory distress. She has no decreased breath sounds. She has no wheezes. She has no rhonchi. Right breast exhibits no inverted nipple, no mass, no nipple discharge and no tenderness (no axillary adenopathy). Left breast exhibits no inverted nipple, no mass, no nipple discharge and no tenderness (no axilarry adenopathy).  Abdominal: Soft.  Bowel sounds are normal. There is no tenderness.  Musculoskeletal: She exhibits no edema or tenderness.  Lymphadenopathy:    She has no cervical adenopathy.  Neurological: She is alert and oriented to person, place, and time.  Skin: Skin is warm. No rash noted. No erythema.  Psychiatric: She has a normal mood and affect. Her behavior is normal.    BP (!) 150/80   Pulse 69   Temp 98.7 F  (37.1 C) (Oral)   Ht 5\' 4"  (1.626 m)   Wt 187 lb 3.2 oz (84.9 kg)   SpO2 95%   BMI 32.13 kg/m  Wt Readings from Last 3 Encounters:  08/11/16 187 lb 3.2 oz (84.9 kg)  07/28/16 187 lb 6.4 oz (85 kg)  05/01/16 188 lb 6 oz (85.4 kg)     Lab Results  Component Value Date   WBC 4.0 04/10/2016   HGB 12.2 04/10/2016   HCT 36.2 04/10/2016   PLT 249.0 04/10/2016   GLUCOSE 103 (H) 08/07/2016   CHOL 136 08/07/2016   TRIG 70.0 08/07/2016   HDL 44.30 08/07/2016   LDLCALC 78 08/07/2016   ALT 26 08/07/2016   AST 21 08/07/2016   NA 143 08/07/2016   K 4.2 08/07/2016   CL 109 08/07/2016   CREATININE 1.24 (H) 08/07/2016   BUN 30 (H) 08/07/2016   CO2 28 08/07/2016   TSH 1.54 04/10/2016    Mm Diag Breast Tomo Bilateral  Result Date: 07/24/2016 CLINICAL DATA:  Six-month follow-up for probably benign asymmetry in the right breast. EXAM: 2D DIGITAL DIAGNOSTIC BILATERAL MAMMOGRAM WITH CAD AND ADJUNCT TOMO COMPARISON:  Previous exams including most recent diagnostic mammogram dated 02/04/2016 and baseline screening mammogram dated 07/24/2015. ACR Breast Density Category c: The breast tissue is heterogeneously dense, which may obscure small masses. FINDINGS: The initially described asymmetry within the right breast again has an appearance on 3D tomosynthesis that is most suggestive of normal dense fibroglandular tissues. Overall fibroglandular pattern is stable bilaterally. There are no dominant masses, suspicious calcifications or secondary signs of malignancy identified in either breast. Mammographic images were processed with CAD. IMPRESSION: Stable appearance of the right breast asymmetry initially questioned on patient's baseline screening mammogram of 07/24/2015, with appearance on today's exam that is again most suggestive of normal dense fibroglandular tissues. Recommend additional bilateral diagnostic mammogram in 12 months to ensure 2 year stability. No evidence of malignancy within the left  breast. RECOMMENDATION: Bilateral diagnostic mammogram in 12 months. I have discussed the findings and recommendations with the patient. Results were also provided in writing at the conclusion of the visit. If applicable, a reminder letter will be sent to the patient regarding the next appointment. BI-RADS CATEGORY  3: Probably benign. Electronically Signed   By: Franki Cabot M.D.   On: 07/24/2016 13:45       Assessment & Plan:   Problem List Items Addressed This Visit    Abnormal mammogram    Had mammogram 07/24/16 - Birads III.  Recommended f/u bilateral diagnostic mammogram in 12 months.        Elevated serum creatinine    Creatinine slightly increased on recent check.  Overall stable.  Continue to stay hydrated.  Recheck metabolic panel in 6-8 weeks.        Relevant Orders   Basic metabolic panel   Family history of colon cancer    Discussed the need for colonoscopy.  She is in agreement.  Refer to GI.       Relevant Orders  Ambulatory referral to Gastroenterology   Health care maintenance    Physical today 08/11/16.  Mammogram 07/24/16 - birads III.  Recommended f/u bilateral diagnostic mammo in 12 months.  Discussed colon cancer screening.  She is in agreement for referral.        Hypercholesterolemia    On crestor.  Cholesterol improved.  Low cholesterol diet and exercise.  Follow lipid panel and liver function tests.        Hypertension, essential    Blood pressure overall improved.  Her checks range 130-140s.  Continue same medication regimen.  Follow pressures.  Follow metabolic panel.        Other Visit Diagnoses    Colon cancer screening    -  Primary   Relevant Orders   Ambulatory referral to Gastroenterology       Einar Pheasant, MD

## 2016-08-11 NOTE — Assessment & Plan Note (Signed)
Had mammogram 07/24/16 - Birads III.  Recommended f/u bilateral diagnostic mammogram in 12 months.

## 2016-08-11 NOTE — Assessment & Plan Note (Addendum)
Physical today 08/11/16.  Mammogram 07/24/16 - birads III.  Recommended f/u bilateral diagnostic mammo in 12 months.  Discussed colon cancer screening.  She is in agreement for referral.

## 2016-08-11 NOTE — Assessment & Plan Note (Signed)
On crestor.  Cholesterol improved.  Low cholesterol diet and exercise.  Follow lipid panel and liver function tests.   

## 2016-08-11 NOTE — Assessment & Plan Note (Signed)
Blood pressure overall improved.  Her checks range 130-140s.  Continue same medication regimen.  Follow pressures.  Follow metabolic panel.

## 2016-08-11 NOTE — Assessment & Plan Note (Signed)
Discussed the need for colonoscopy.  She is in agreement.  Refer to GI.

## 2016-08-11 NOTE — Assessment & Plan Note (Signed)
Creatinine slightly increased on recent check.  Overall stable.  Continue to stay hydrated.  Recheck metabolic panel in 6-8 weeks.

## 2016-08-11 NOTE — Progress Notes (Signed)
Pre visit review using our clinic review tool, if applicable. No additional management support is needed unless otherwise documented below in the visit note. 

## 2016-09-10 DIAGNOSIS — H524 Presbyopia: Secondary | ICD-10-CM | POA: Diagnosis not present

## 2016-09-10 DIAGNOSIS — H40023 Open angle with borderline findings, high risk, bilateral: Secondary | ICD-10-CM | POA: Diagnosis not present

## 2016-09-12 DIAGNOSIS — I10 Essential (primary) hypertension: Secondary | ICD-10-CM | POA: Diagnosis not present

## 2016-09-12 DIAGNOSIS — Z800 Family history of malignant neoplasm of digestive organs: Secondary | ICD-10-CM | POA: Diagnosis not present

## 2016-09-29 ENCOUNTER — Other Ambulatory Visit (INDEPENDENT_AMBULATORY_CARE_PROVIDER_SITE_OTHER): Payer: Medicare Other

## 2016-09-29 DIAGNOSIS — R7989 Other specified abnormal findings of blood chemistry: Secondary | ICD-10-CM | POA: Diagnosis not present

## 2016-09-29 LAB — BASIC METABOLIC PANEL
BUN: 23 mg/dL (ref 6–23)
CALCIUM: 10.2 mg/dL (ref 8.4–10.5)
CO2: 28 meq/L (ref 19–32)
CREATININE: 1.11 mg/dL (ref 0.40–1.20)
Chloride: 109 mEq/L (ref 96–112)
GFR: 61.55 mL/min (ref >60.00)
Glucose, Bld: 119 mg/dL — ABNORMAL HIGH (ref 70–99)
Potassium: 4.5 mEq/L (ref 3.5–5.1)
Sodium: 145 mEq/L (ref 135–145)

## 2016-10-23 ENCOUNTER — Other Ambulatory Visit: Payer: Self-pay | Admitting: Internal Medicine

## 2016-12-02 ENCOUNTER — Encounter: Payer: Self-pay | Admitting: *Deleted

## 2016-12-03 ENCOUNTER — Ambulatory Visit: Payer: Medicare Other | Admitting: Anesthesiology

## 2016-12-03 ENCOUNTER — Ambulatory Visit
Admission: RE | Admit: 2016-12-03 | Discharge: 2016-12-03 | Disposition: A | Discharge status: Home / Self Care | Payer: Medicare Other | Source: Ambulatory Visit | Attending: Unknown Physician Specialty | Admitting: Unknown Physician Specialty

## 2016-12-03 ENCOUNTER — Encounter
Admission: RE | Disposition: A | Discharge status: Home / Self Care | Payer: Self-pay | Source: Ambulatory Visit | Attending: Unknown Physician Specialty

## 2016-12-03 DIAGNOSIS — E7800 Pure hypercholesterolemia, unspecified: Secondary | ICD-10-CM | POA: Insufficient documentation

## 2016-12-03 DIAGNOSIS — Z800 Family history of malignant neoplasm of digestive organs: Secondary | ICD-10-CM | POA: Insufficient documentation

## 2016-12-03 DIAGNOSIS — K5730 Diverticulosis of large intestine without perforation or abscess without bleeding: Secondary | ICD-10-CM | POA: Insufficient documentation

## 2016-12-03 DIAGNOSIS — E669 Obesity, unspecified: Secondary | ICD-10-CM | POA: Diagnosis not present

## 2016-12-03 DIAGNOSIS — K5790 Diverticulosis of intestine, part unspecified, without perforation or abscess without bleeding: Secondary | ICD-10-CM | POA: Diagnosis not present

## 2016-12-03 DIAGNOSIS — Z6831 Body mass index (BMI) 31.0-31.9, adult: Secondary | ICD-10-CM | POA: Insufficient documentation

## 2016-12-03 DIAGNOSIS — K640 First degree hemorrhoids: Secondary | ICD-10-CM | POA: Diagnosis not present

## 2016-12-03 DIAGNOSIS — I10 Essential (primary) hypertension: Secondary | ICD-10-CM | POA: Diagnosis not present

## 2016-12-03 DIAGNOSIS — Z79899 Other long term (current) drug therapy: Secondary | ICD-10-CM | POA: Diagnosis not present

## 2016-12-03 DIAGNOSIS — K648 Other hemorrhoids: Secondary | ICD-10-CM | POA: Diagnosis not present

## 2016-12-03 DIAGNOSIS — Z1211 Encounter for screening for malignant neoplasm of colon: Secondary | ICD-10-CM | POA: Diagnosis not present

## 2016-12-03 HISTORY — PX: COLONOSCOPY WITH PROPOFOL: SHX5780

## 2016-12-03 SURGERY — COLONOSCOPY WITH PROPOFOL
Anesthesia: General

## 2016-12-03 MED ORDER — LIDOCAINE HCL (PF) 2 % IJ SOLN
INTRAMUSCULAR | Status: DC | PRN
  Administered 2016-12-03: 50 mg via INTRADERMAL

## 2016-12-03 MED ORDER — SODIUM CHLORIDE 0.9 % IV SOLN: INTRAVENOUS | Status: DC

## 2016-12-03 MED ORDER — SODIUM CHLORIDE 0.9 % IV SOLN
INTRAVENOUS | Status: DC
  Administered 2016-12-03: 14:00:00 via INTRAVENOUS

## 2016-12-03 MED ORDER — PROPOFOL 10 MG/ML IV BOLUS
INTRAVENOUS | Status: DC | PRN
  Administered 2016-12-03 (×2): 50 mg via INTRAVENOUS

## 2016-12-03 MED ORDER — PROPOFOL 500 MG/50ML IV EMUL
INTRAVENOUS | Status: DC | PRN
  Administered 2016-12-03: 150 ug/kg/min via INTRAVENOUS

## 2016-12-03 NOTE — Op Note (Signed)
[facility] Gastroenterology Patient Name: Sheri Cook Procedure Date: 12/03/2016 2:13 PM MRN: [id number] Account #: [id number] Date of Birth: 05-27-1941 Admit Type: Outpatient Age: 76 Room: [facility] ENDO ROOM 4 Gender: Female Note Status: Finalized Procedure:            Colonoscopy Indications:          Screening for colorectal malignant neoplasm Providers:            Manya Silvas, MD Referring MD:         Einar Pheasant, MD (Referring MD) Medicines:            Propofol per Anesthesia Complications:        No immediate complications. Procedure:            Pre-Anesthesia Assessment:                       - After reviewing the risks and benefits, the patient                        was deemed in satisfactory condition to undergo the                        procedure.                       After obtaining informed consent, the colonoscope was                        passed under direct vision. Throughout the procedure,                        the patient's blood pressure, pulse, and oxygen                        saturations were monitored continuously. The                        Colonoscope was introduced through the anus and                        advanced to the the cecum, identified by appendiceal                        orifice and ileocecal valve. The colonoscopy was                        somewhat difficult due to a tortuous colon. The patient                        tolerated the procedure well. The quality of the bowel                        preparation was adequate to identify polyps 6 mm and                        larger in size. Findings:      Cecum reached but picture not done, done of proximal ascending colon.      Multiple small and large-mouthed diverticula were found in the sigmoid       colon, descending colon, transverse colon and ascending colon.  Internal hemorrhoids were found during endoscopy. The hemorrhoids were       small and Grade I  (internal hemorrhoids that do not prolapse).      The exam was otherwise without abnormality. Impression:           - Diverticulosis in the sigmoid colon, in the                        descending colon, in the transverse colon and in the                        ascending colon.                       - Internal hemorrhoids.                       - The examination was otherwise normal.                       - No specimens collected. Recommendation:       - The findings and recommendations were discussed with                        the patient's family. No repeat needed. Manya Silvas, MD 12/03/2016 2:47:37 PM This report has been signed electronically. Number of Addenda: 0 Note Initiated On: 12/03/2016 2:13 PM Scope Withdrawal Time: 0 hours 10 minutes 3 seconds  Total Procedure Duration: 0 hours 15 minutes 22 seconds       [facility]

## 2016-12-03 NOTE — Transfer of Care (Signed)
Immediate Anesthesia Transfer of Care Note  Patient: Sheri Cook  Procedure(s) Performed: Procedure(s): COLONOSCOPY WITH PROPOFOL (N/A)  Patient Location: PACU  Anesthesia Type:General  Level of Consciousness: sedated and responds to stimulation  Airway & Oxygen Therapy: Patient Spontanous Breathing and Patient connected to nasal cannula oxygen  Post-op Assessment: Report given to RN and Post -op Vital signs reviewed and stable  Post vital signs: Reviewed and stable  Last Vitals:  Vitals:   12/03/16 1300 12/03/16 1448  BP: (!) 175/71 (!) 119/53  Pulse: 72 60  Resp: 18 20  Temp: 36.1 C     Last Pain:  Vitals:   12/03/16 1300  TempSrc: Tympanic         Complications: No apparent anesthesia complications

## 2016-12-03 NOTE — Anesthesia Procedure Notes (Signed)
Performed by: Jshawn Hurta Pre-anesthesia Checklist: Patient identified, Emergency Drugs available, Suction available, Patient being monitored and Timeout performed Patient Re-evaluated:Patient Re-evaluated prior to inductionOxygen Delivery Method: Nasal cannula Preoxygenation: Pre-oxygenation with 100% oxygen Intubation Type: IV induction       

## 2016-12-03 NOTE — Anesthesia Preprocedure Evaluation (Signed)
Anesthesia Evaluation  Patient identified by MRN, date of birth, ID band Patient awake    Reviewed: Allergy & Precautions, NPO status , Patient's Chart, lab work & pertinent test results  History of Anesthesia Complications Negative for: history of anesthetic complications  Airway Mallampati: III  TM Distance: >3 FB Neck ROM: Full    Dental  (+) Caps   Pulmonary neg pulmonary ROS, neg sleep apnea, neg COPD,    breath sounds clear to auscultation- rhonchi (-) wheezing      Cardiovascular Exercise Tolerance: Good hypertension, Pt. on medications (-) CAD and (-) Past MI  Rhythm:Regular Rate:Normal - Systolic murmurs and - Diastolic murmurs    Neuro/Psych negative neurological ROS  negative psych ROS   GI/Hepatic negative GI ROS, Neg liver ROS,   Endo/Other  negative endocrine ROSneg diabetes  Renal/GU negative Renal ROS     Musculoskeletal negative musculoskeletal ROS (+)   Abdominal (+) + obese,   Peds  Hematology negative hematology ROS (+)   Anesthesia Other Findings Past Medical History: No date: Essential hypertension No date: History of chicken pox No date: Hypercholesterolemia   Reproductive/Obstetrics                             Anesthesia Physical Anesthesia Plan  ASA: II  Anesthesia Plan: General   Post-op Pain Management:    Induction: Intravenous  Airway Management Planned: Natural Airway  Additional Equipment:   Intra-op Plan:   Post-operative Plan:   Informed Consent: I have reviewed the patients History and Physical, chart, labs and discussed the procedure including the risks, benefits and alternatives for the proposed anesthesia with the patient or authorized representative who has indicated his/her understanding and acceptance.   Dental advisory given  Plan Discussed with: CRNA and Anesthesiologist  Anesthesia Plan Comments:         Anesthesia  Quick Evaluation

## 2016-12-03 NOTE — Anesthesia Post-op Follow-up Note (Cosign Needed)
Anesthesia QCDR form completed.        

## 2016-12-03 NOTE — H&P (Signed)
   Primary Care Physician:  Einar Pheasant, MD Primary Gastroenterologist:  Dr. Vira Agar  Pre-Procedure History & Physical: HPI:  Sheri Cook is a 76 y.o. female is here for an colonoscopy.   Past Medical History:  Diagnosis Date  . Essential hypertension   . History of chicken pox   . Hypercholesterolemia     Past Surgical History:  Procedure Laterality Date  . cataract surgery     bilateral    Prior to Admission medications   Medication Sig Start Date End Date Taking? Authorizing Provider  losartan-hydrochlorothiazide (HYZAAR) 100-12.5 MG tablet Take 1 tablet by mouth daily. 07/07/16 12/03/16 Yes Einar Pheasant, MD  rosuvastatin (CRESTOR) 10 MG tablet TAKE ONE TABLET BY MOUTH ONCE DAILY 10/23/16  Yes Einar Pheasant, MD  clotrimazole (LOTRIMIN) 1 % external solution Apply 1 application topically 2 (two) times daily. In between toes on left foot Patient not taking: Reported on 12/03/2016 12/21/15   Landis Martins, DPM    Allergies as of 11/10/2016  . (No Known Allergies)    Family History  Problem Relation Age of Onset  . Hypertension Mother   . Congestive Heart Failure Mother   . Colon cancer Brother     Social History   Social History  . Marital status: Single    Spouse name: N/A  . Number of children: N/A  . Years of education: N/A   Occupational History  . Not on file.   Social History Main Topics  . Smoking status: Never Smoker  . Smokeless tobacco: Never Used  . Alcohol use No  . Drug use: No  . Sexual activity: No   Other Topics Concern  . Not on file   Social History Narrative  . No narrative on file    Review of Systems: See HPI, otherwise negative ROS  Physical Exam: BP (!) 175/71   Pulse 72   Temp 97 F (36.1 C) (Tympanic)   Resp 18   Ht 5\' 3"  (1.6 m)   Wt 81.6 kg (180 lb)   SpO2 100%   BMI 31.89 kg/m  General:   Alert,  pleasant and cooperative in NAD Head:  Normocephalic and atraumatic. Neck:  Supple; no masses or  thyromegaly. Lungs:  Clear throughout to auscultation.    Heart:  Regular rate and rhythm. Abdomen:  Soft, nontender and nondistended. Normal bowel sounds, without guarding, and without rebound.   Neurologic:  Alert and  oriented x4;  grossly normal neurologically.  Impression/Plan: [name] is here for an colonoscopy to be performed for FH colon cancer  Risks, benefits, limitations, and alternatives regarding  colonoscopy have been reviewed with the patient.  Questions have been answered.  All parties agreeable.   Gaylyn Cheers, MD  12/03/2016, 2:21 PM

## 2016-12-04 ENCOUNTER — Encounter: Payer: Self-pay | Admitting: Unknown Physician Specialty

## 2016-12-04 NOTE — Anesthesia Postprocedure Evaluation (Signed)
Anesthesia Post Note  Patient: Sheri Cook  Procedure(s) Performed: Procedure(s) (LRB): COLONOSCOPY WITH PROPOFOL (N/A)  Patient location during evaluation: Endoscopy Anesthesia Type: General Level of consciousness: awake and alert and oriented Pain management: pain level controlled Vital Signs Assessment: post-procedure vital signs reviewed and stable Respiratory status: spontaneous breathing, nonlabored ventilation and respiratory function stable Cardiovascular status: blood pressure returned to baseline and stable Postop Assessment: no signs of nausea or vomiting Anesthetic complications: no     Last Vitals:  Vitals:   12/03/16 1300 12/03/16 1448  BP: (!) 175/71 (!) 119/53  Pulse: 72 60  Resp: 18 20  Temp: 36.1 C 36.3 C    Last Pain:  Vitals:   12/03/16 1448  TempSrc: Tympanic                 Brentley Landfair

## 2016-12-15 ENCOUNTER — Encounter: Payer: Self-pay | Admitting: Internal Medicine

## 2016-12-15 ENCOUNTER — Ambulatory Visit (INDEPENDENT_AMBULATORY_CARE_PROVIDER_SITE_OTHER): Payer: Medicare Other | Admitting: Internal Medicine

## 2016-12-15 VITALS — BP 138/62 | HR 61 | Temp 98.8°F | Ht 63.0 in | Wt 184.8 lb

## 2016-12-15 DIAGNOSIS — Z800 Family history of malignant neoplasm of digestive organs: Secondary | ICD-10-CM | POA: Diagnosis not present

## 2016-12-15 DIAGNOSIS — E7800 Pure hypercholesterolemia, unspecified: Secondary | ICD-10-CM

## 2016-12-15 DIAGNOSIS — Z23 Encounter for immunization: Secondary | ICD-10-CM | POA: Diagnosis not present

## 2016-12-15 DIAGNOSIS — R7989 Other specified abnormal findings of blood chemistry: Secondary | ICD-10-CM | POA: Diagnosis not present

## 2016-12-15 DIAGNOSIS — I10 Essential (primary) hypertension: Secondary | ICD-10-CM | POA: Diagnosis not present

## 2016-12-15 MED ORDER — ROSUVASTATIN CALCIUM 10 MG PO TABS: 10.0000 mg | ORAL_TABLET | Freq: Every day | ORAL | 2 refills | Status: DC

## 2016-12-15 NOTE — Progress Notes (Signed)
[name], female   DOB: [birth year], 76 y.o.   MRN: [id number]   [name], female    DOB: Aug 03, 1941, 76 y.o.   MRN: [id number]  HPI  Patient here for a scheduled follow up.  She reports she is doing well.  Feels good.  No chest pain.  Still working.  No sob.  Eating and drinking well.  Trying to watch her diet.  No abdominal pain.  Bowels stable.  Blood pressure has been doing well.    Past Medical History:  Diagnosis Date  . Essential hypertension   . History of chicken pox   . Hypercholesterolemia    Past Surgical History:  Procedure Laterality Date  . cataract surgery     bilateral  . COLONOSCOPY WITH PROPOFOL N/A 12/03/2016   Procedure: COLONOSCOPY WITH PROPOFOL;  Surgeon: Manya Silvas, MD;  Location: [facility] ENDOSCOPY;  Service: Endoscopy;  Laterality: N/A;   Family History  Problem Relation Age of Onset  . Hypertension Mother   . Congestive Heart Failure Mother   . Colon cancer Brother    Social History   Social History  . Marital status: Single    Spouse name: N/A  . Number of children: N/A  . Years of education: N/A   Social History Main Topics  . Smoking status: Never Smoker  . Smokeless tobacco: Never Used  . Alcohol use No  . Drug use: No  . Sexual activity: No   Other Topics Concern  . None   Social History Narrative  . None    Outpatient Encounter Prescriptions as of 12/15/2016  Medication Sig  . losartan-hydrochlorothiazide (HYZAAR) 100-12.5 MG tablet Take 1 tablet by mouth daily.  . rosuvastatin (CRESTOR) 10 MG tablet Take 1 tablet (10 mg total) by mouth daily.  . [DISCONTINUED] rosuvastatin (CRESTOR) 10 MG tablet TAKE ONE TABLET BY MOUTH ONCE DAILY  . clotrimazole (LOTRIMIN) 1 % external solution Apply 1 application topically 2 (two) times daily. In between toes on left foot (Patient not taking: Reported on 12/03/2016)   Facility-Administered Encounter Medications as of 12/15/2016  Medication  .  triamcinolone acetonide (KENALOG) 10 MG/ML injection 10 mg    Review of Systems  Constitutional: Negative for appetite change and unexpected weight change.  HENT: Negative for congestion and sinus pressure.   Respiratory: Negative for cough, chest tightness and shortness of breath.   Cardiovascular: Negative for chest pain, palpitations and leg swelling.  Gastrointestinal: Negative for abdominal pain, diarrhea, nausea and vomiting.  Genitourinary: Negative for difficulty urinating and dysuria.  Musculoskeletal: Negative for back pain and joint swelling.  Skin: Negative for color change and rash.  Neurological: Negative for dizziness, light-headedness and headaches.  Psychiatric/Behavioral: Negative for agitation and dysphoric mood.       Objective:     Blood pressure rechecked by me:  138/62  Physical Exam  Constitutional: She appears well-developed and well-nourished. No distress.  HENT:  Nose: Nose normal.  Mouth/Throat: Oropharynx is clear and moist.  Neck: Neck supple. No thyromegaly present.  Cardiovascular: Normal rate and regular rhythm.   Pulmonary/Chest: Breath sounds normal. No respiratory distress. She has no wheezes.  Abdominal: Soft. Bowel sounds are normal. There is no tenderness.  Musculoskeletal: She exhibits no edema or tenderness.  Lymphadenopathy:    She has no cervical adenopathy.  Skin: No rash noted. No erythema.  Psychiatric: She has a normal mood and affect. Her behavior is normal.    BP 138/62  Pulse 61   Temp 98.8 F (37.1 C) (Oral)   Ht 5\' 3"  (1.6 m)   Wt 184 lb 12.8 oz (83.8 kg)   SpO2 95%   BMI 32.74 kg/m  Wt Readings from Last 3 Encounters:  12/15/16 184 lb 12.8 oz (83.8 kg)  12/03/16 180 lb (81.6 kg)  08/11/16 187 lb 3.2 oz (84.9 kg)     Lab Results  Component Value Date   WBC 4.0 04/10/2016   HGB 12.2 04/10/2016   HCT 36.2 04/10/2016   PLT 249.0 04/10/2016   GLUCOSE 119 (H) 09/29/2016   CHOL 136 08/07/2016   TRIG 70.0  08/07/2016   HDL 44.30 08/07/2016   LDLCALC 78 08/07/2016   ALT 26 08/07/2016   AST 21 08/07/2016   NA 145 09/29/2016   K 4.5 09/29/2016   CL 109 09/29/2016   CREATININE 1.11 09/29/2016   BUN 23 09/29/2016   CO2 28 09/29/2016   TSH 1.54 04/10/2016       Assessment & Plan:   Problem List Items Addressed This Visit    Elevated serum creatinine    Stay hydrated.  Last creatinine 1.11.  Follow.        Family history of colon cancer    Just had colonoscopy 11/2016.        Hypercholesterolemia    On crestor.  Low cholesterol diet and exercise.  Follow lipid panel and liver function tests.   Lab Results  Component Value Date   CHOL 136 08/07/2016   HDL 44.30 08/07/2016   LDLCALC 78 08/07/2016   TRIG 70.0 08/07/2016   CHOLHDL 3 08/07/2016        Relevant Medications   rosuvastatin (CRESTOR) 10 MG tablet   Other Relevant Orders   Hepatic function panel   Lipid panel   Hypertension, essential    Blood pressure under good control.  Continue same medication regimen.  Follow pressures.  Follow metabolic panel.        Relevant Medications   rosuvastatin (CRESTOR) 10 MG tablet   Other Relevant Orders   Basic metabolic panel       Einar Pheasant, MD

## 2016-12-15 NOTE — Assessment & Plan Note (Signed)
On crestor.  Low cholesterol diet and exercise.  Follow lipid panel and liver function tests.   Lab Results  Component Value Date   CHOL 136 08/07/2016   HDL 44.30 08/07/2016   LDLCALC 78 08/07/2016   TRIG 70.0 08/07/2016   CHOLHDL 3 08/07/2016

## 2016-12-15 NOTE — Addendum Note (Signed)
Addended by: Francella Solian on: 12/15/2016 08:44 AM   Modules accepted: Orders

## 2016-12-15 NOTE — Assessment & Plan Note (Signed)
Stay hydrated.  Last creatinine 1.11.  Follow.

## 2016-12-15 NOTE — Assessment & Plan Note (Signed)
Just had colonoscopy 11/2016.

## 2016-12-15 NOTE — Progress Notes (Signed)
Pre-visit discussion using our clinic review tool. No additional management support is needed unless otherwise documented below in the visit note.  

## 2016-12-15 NOTE — Assessment & Plan Note (Signed)
Blood pressure under good control.  Continue same medication regimen.  Follow pressures.  Follow metabolic panel.   

## 2017-02-03 ENCOUNTER — Other Ambulatory Visit: Payer: Self-pay

## 2017-02-03 MED ORDER — LOSARTAN POTASSIUM-HCTZ 100-12.5 MG PO TABS: 1.0000 | ORAL_TABLET | Freq: Every day | ORAL | 1 refills | Status: DC

## 2017-02-10 ENCOUNTER — Other Ambulatory Visit (INDEPENDENT_AMBULATORY_CARE_PROVIDER_SITE_OTHER): Payer: Medicare Other

## 2017-02-10 DIAGNOSIS — E7800 Pure hypercholesterolemia, unspecified: Secondary | ICD-10-CM

## 2017-02-10 DIAGNOSIS — I10 Essential (primary) hypertension: Secondary | ICD-10-CM

## 2017-02-10 LAB — BASIC METABOLIC PANEL
BUN: 22 mg/dL (ref 6–23)
CO2: 29 mEq/L (ref 19–32)
Calcium: 10.1 mg/dL (ref 8.4–10.5)
Chloride: 109 mEq/L (ref 96–112)
Creatinine, Ser: 1.1 mg/dL (ref 0.40–1.20)
GFR: 62.14 mL/min (ref >60.00)
Glucose, Bld: 100 mg/dL — ABNORMAL HIGH (ref 70–99)
POTASSIUM: 3.9 meq/L (ref 3.5–5.1)
SODIUM: 144 meq/L (ref 135–145)

## 2017-02-10 LAB — LIPID PANEL
Cholesterol: 140 mg/dL (ref 0–200)
HDL: 42.6 mg/dL (ref >39.00)
LDL CALC: 80 mg/dL (ref 0–99)
NONHDL: 97.68
Total CHOL/HDL Ratio: 3
Triglycerides: 88 mg/dL (ref 0.0–149.0)
VLDL: 17.6 mg/dL (ref 0.0–40.0)

## 2017-02-10 LAB — HEPATIC FUNCTION PANEL
ALBUMIN: 4.3 g/dL (ref 3.5–5.2)
ALK PHOS: 66 U/L (ref 39–117)
ALT: 21 U/L (ref 0–35)
AST: 20 U/L (ref 0–37)
BILIRUBIN DIRECT: 0.1 mg/dL (ref 0.0–0.3)
BILIRUBIN TOTAL: 0.4 mg/dL (ref 0.2–1.2)
Total Protein: 6.9 g/dL (ref 6.0–8.3)

## 2017-02-11 ENCOUNTER — Telehealth: Payer: Self-pay

## 2017-02-11 NOTE — Telephone Encounter (Signed)
-----   Message from Einar Pheasant, MD sent at 02/11/2017  5:30 AM EDT ----- Notify pt that her cholesterol levels look good.  Kidney function tests stable and ok.  Liver function tests are wnl.

## 2017-02-11 NOTE — Telephone Encounter (Signed)
Left message to return call to our office.  

## 2017-02-12 NOTE — Telephone Encounter (Signed)
Pt called back returning your call. Call back around 1 pm.  Call pt @ [phone]. Thank you!

## 2017-02-12 NOTE — Telephone Encounter (Signed)
Left message to return call to our office.  

## 2017-02-13 NOTE — Telephone Encounter (Signed)
Pre-visit discussion using our clinic review tool. No additional management support is needed unless otherwise documented below in the visit note.  

## 2017-02-16 NOTE — Telephone Encounter (Signed)
Patient has requested a call at [phone]

## 2017-02-16 NOTE — Telephone Encounter (Signed)
Left message to return call to our office.  

## 2017-02-17 NOTE — Telephone Encounter (Signed)
Pt called back returning your call. Thank you!  Call pt @ [phone]

## 2017-02-17 NOTE — Telephone Encounter (Signed)
Patient informed no questions  

## 2017-03-11 DIAGNOSIS — H40023 Open angle with borderline findings, high risk, bilateral: Secondary | ICD-10-CM | POA: Diagnosis not present

## 2017-04-21 ENCOUNTER — Encounter: Payer: Self-pay | Admitting: Internal Medicine

## 2017-04-21 ENCOUNTER — Ambulatory Visit (INDEPENDENT_AMBULATORY_CARE_PROVIDER_SITE_OTHER): Payer: Medicare Other | Admitting: Internal Medicine

## 2017-04-21 DIAGNOSIS — I10 Essential (primary) hypertension: Secondary | ICD-10-CM

## 2017-04-21 DIAGNOSIS — E7800 Pure hypercholesterolemia, unspecified: Secondary | ICD-10-CM | POA: Diagnosis not present

## 2017-04-21 MED ORDER — LOSARTAN POTASSIUM-HCTZ 100-12.5 MG PO TABS: 1.0000 | ORAL_TABLET | Freq: Every day | ORAL | 1 refills | Status: DC

## 2017-04-21 MED ORDER — ROSUVASTATIN CALCIUM 10 MG PO TABS: 10.0000 mg | ORAL_TABLET | Freq: Every day | ORAL | 1 refills | Status: DC

## 2017-04-21 NOTE — Progress Notes (Signed)
[name], female   DOB: 05/01/1941, 76 y.o.   MRN: [id number]   [name], female    DOB: Sep 18, 1941, 76 y.o.   MRN: [id number]  HPI  Patient here for a scheduled follow up.  She reports she is doing well.  Still working.  Stays active.  Trying to watch her diet.  No chest pain.  No sob.  No acid reflux.  No abdominal pain.  Bowels moving.  Taking medication regularly.     Past Medical History:  Diagnosis Date  . Essential hypertension   . History of chicken pox   . Hypercholesterolemia    Past Surgical History:  Procedure Laterality Date  . cataract surgery     bilateral  . COLONOSCOPY WITH PROPOFOL N/A 12/03/2016   Procedure: COLONOSCOPY WITH PROPOFOL;  Surgeon: Manya Silvas, MD;  Location: [facility] ENDOSCOPY;  Service: Endoscopy;  Laterality: N/A;   Family History  Problem Relation Age of Onset  . Hypertension Mother   . Congestive Heart Failure Mother   . Colon cancer Brother    Social History   Social History  . Marital status: Single    Spouse name: N/A  . Number of children: N/A  . Years of education: N/A   Social History Main Topics  . Smoking status: Never Smoker  . Smokeless tobacco: Never Used  . Alcohol use No  . Drug use: No  . Sexual activity: No   Other Topics Concern  . None   Social History Narrative  . None    Outpatient Encounter Prescriptions as of 04/21/2017  Medication Sig  . clotrimazole (LOTRIMIN) 1 % external solution Apply 1 application topically 2 (two) times daily. In between toes on left foot  . losartan-hydrochlorothiazide (HYZAAR) 100-12.5 MG tablet Take 1 tablet by mouth daily.  . rosuvastatin (CRESTOR) 10 MG tablet Take 1 tablet (10 mg total) by mouth daily.  . [DISCONTINUED] losartan-hydrochlorothiazide (HYZAAR) 100-12.5 MG tablet Take 1 tablet by mouth daily.  . [DISCONTINUED] rosuvastatin (CRESTOR) 10 MG tablet Take 1 tablet (10 mg total) by mouth daily.    Facility-Administered Encounter Medications as of 04/21/2017  Medication  . triamcinolone acetonide (KENALOG) 10 MG/ML injection 10 mg    Review of Systems  Constitutional: Negative for appetite change and unexpected weight change.  HENT: Negative for congestion and sinus pressure.   Respiratory: Negative for cough, chest tightness and shortness of breath.   Cardiovascular: Negative for chest pain, palpitations and leg swelling.  Gastrointestinal: Negative for abdominal pain, diarrhea, nausea and vomiting.  Genitourinary: Negative for difficulty urinating and dysuria.  Musculoskeletal: Negative for back pain and joint swelling.  Skin: Negative for color change and rash.  Neurological: Negative for dizziness, light-headedness and headaches.  Psychiatric/Behavioral: Negative for agitation and dysphoric mood.       Objective:    Physical Exam  Constitutional: She appears well-developed and well-nourished. No distress.  HENT:  Nose: Nose normal.  Mouth/Throat: Oropharynx is clear and moist.  Neck: Neck supple. No thyromegaly present.  Cardiovascular: Normal rate and regular rhythm.   Pulmonary/Chest: Breath sounds normal. No respiratory distress. She has no wheezes.  Abdominal: Soft. Bowel sounds are normal. There is no tenderness.  Musculoskeletal: She exhibits no edema or tenderness.  Lymphadenopathy:    She has no cervical adenopathy.  Skin: No rash noted. No erythema.  Psychiatric: She has a normal mood and affect. Her behavior is normal.    BP 134/62 (BP Location: Left  Arm, Patient Position: Sitting, Cuff Size: Normal)   Pulse 64   Temp 98.6 F (37 C) (Oral)   Resp 12   Ht 5\' 3"  (1.6 m)   Wt 182 lb 3.2 oz (82.6 kg)   SpO2 97%   BMI 32.28 kg/m  Wt Readings from Last 3 Encounters:  04/21/17 182 lb 3.2 oz (82.6 kg)  12/15/16 184 lb 12.8 oz (83.8 kg)  12/03/16 180 lb (81.6 kg)     Lab Results  Component Value Date   WBC 4.0 04/10/2016   HGB 12.2 04/10/2016    HCT 36.2 04/10/2016   PLT 249.0 04/10/2016   GLUCOSE 100 (H) 02/10/2017   CHOL 140 02/10/2017   TRIG 88.0 02/10/2017   HDL 42.60 02/10/2017   LDLCALC 80 02/10/2017   ALT 21 02/10/2017   AST 20 02/10/2017   NA 144 02/10/2017   K 3.9 02/10/2017   CL 109 02/10/2017   CREATININE 1.10 02/10/2017   BUN 22 02/10/2017   CO2 29 02/10/2017   TSH 1.54 04/10/2016       Assessment & Plan:   Problem List Items Addressed This Visit    Hypercholesterolemia    On crestor.  Trying to watch her diet.  Low cholesterol diet and exercise.  Follow lipid panel and liver function tests.   Lab Results  Component Value Date   CHOL 140 02/10/2017   HDL 42.60 02/10/2017   LDLCALC 80 02/10/2017   TRIG 88.0 02/10/2017   CHOLHDL 3 02/10/2017        Relevant Medications   losartan-hydrochlorothiazide (HYZAAR) 100-12.5 MG tablet   rosuvastatin (CRESTOR) 10 MG tablet   Other Relevant Orders   Hepatic function panel   Lipid panel   Hypertension, essential    Blood pressure under good control.  Continue same medication regimen.  Follow pressures.  Follow metabolic panel.        Relevant Medications   losartan-hydrochlorothiazide (HYZAAR) 100-12.5 MG tablet   rosuvastatin (CRESTOR) 10 MG tablet   Other Relevant Orders   CBC with Differential/Platelet   TSH   Basic metabolic panel       Sheri Pheasant, MD

## 2017-04-21 NOTE — Progress Notes (Signed)
Pre-visit discussion using our clinic review tool. No additional management support is needed unless otherwise documented below in the visit note.  

## 2017-04-23 ENCOUNTER — Encounter: Payer: Self-pay | Admitting: Internal Medicine

## 2017-04-23 NOTE — Assessment & Plan Note (Signed)
On crestor.  Trying to watch her diet.  Low cholesterol diet and exercise.  Follow lipid panel and liver function tests.   Lab Results  Component Value Date   CHOL 140 02/10/2017   HDL 42.60 02/10/2017   LDLCALC 80 02/10/2017   TRIG 88.0 02/10/2017   CHOLHDL 3 02/10/2017

## 2017-04-23 NOTE — Assessment & Plan Note (Signed)
Blood pressure under good control.  Continue same medication regimen.  Follow pressures.  Follow metabolic panel.   

## 2017-06-17 ENCOUNTER — Other Ambulatory Visit: Payer: Self-pay | Admitting: Internal Medicine

## 2017-06-17 DIAGNOSIS — Z1231 Encounter for screening mammogram for malignant neoplasm of breast: Secondary | ICD-10-CM

## 2017-07-16 ENCOUNTER — Telehealth: Payer: Self-pay | Admitting: Internal Medicine

## 2017-07-16 NOTE — Telephone Encounter (Signed)
LVM for pt to call Sheri Cook back and reschedule her AWV appt on 07/29/17 due to Denisa out of office.   Sheri Cook call back [phone]

## 2017-07-20 NOTE — Telephone Encounter (Signed)
LVM to call back and reschedule AWV appt on 07/29/17 due to Denisa out of office.

## 2017-07-27 ENCOUNTER — Ambulatory Visit
Admission: RE | Admit: 2017-07-27 | Discharge: 2017-07-27 | Disposition: A | Discharge status: Home / Self Care | Payer: Medicare Other | Source: Ambulatory Visit | Attending: Internal Medicine | Admitting: Internal Medicine

## 2017-07-27 DIAGNOSIS — Z1231 Encounter for screening mammogram for malignant neoplasm of breast: Secondary | ICD-10-CM | POA: Diagnosis not present

## 2017-07-29 ENCOUNTER — Ambulatory Visit: Payer: Medicare Other

## 2017-07-30 ENCOUNTER — Ambulatory Visit (INDEPENDENT_AMBULATORY_CARE_PROVIDER_SITE_OTHER): Payer: Medicare Other

## 2017-07-30 VITALS — BP 142/70 | HR 64 | Temp 98.6°F | Resp 14 | Ht 63.0 in | Wt 184.8 lb

## 2017-07-30 DIAGNOSIS — Z0000 Encounter for general adult medical examination without abnormal findings: Secondary | ICD-10-CM | POA: Diagnosis not present

## 2017-07-30 DIAGNOSIS — Z23 Encounter for immunization: Secondary | ICD-10-CM | POA: Diagnosis not present

## 2017-07-30 DIAGNOSIS — Z1331 Encounter for screening for depression: Secondary | ICD-10-CM

## 2017-07-30 NOTE — Patient Instructions (Addendum)
  Ms. Rennaker , Thank you for taking time to come for your Medicare Wellness Visit. I appreciate your ongoing commitment to your health goals. Please review the following plan we discussed and let me know if I can assist you in the future.   Follow up with Dr. Nicki Reaper as needed.    Have a great day!  These are the goals we discussed: Goals    . Portion control          Make healthier choices when dining out and cooking at home.  Choose lean meats, fresh vegetables and fruits.          This is a list of the screening recommended for you and due dates:  Health Maintenance  Topic Date Due  . Tetanus Vaccine  04/22/1960  . Mammogram  07/27/2018  . Flu Shot  Completed  . DEXA scan (bone density measurement)  Completed  . Pneumonia vaccines  Completed

## 2017-07-30 NOTE — Progress Notes (Signed)
Subjective:   Sheri Cook is a 76 y.o. female who presents for Medicare Annual (Subsequent) preventive examination.  Review of Systems:  No ROS.  Medicare Wellness Visit. Additional risk factors are reflected in the social history.  Cardiac Risk Factors include: advanced age (>8men, >81 women);hypertension;obesity (BMI >30kg/m2)     Objective:     Vitals: BP (!) 142/70 (BP Location: Left Arm, Patient Position: Sitting, Cuff Size: Normal)   Pulse 64   Temp 98.6 F (37 C) (Oral)   Resp 14   Ht 5\' 3"  (1.6 m)   Wt 184 lb 12.8 oz (83.8 kg)   SpO2 97%   BMI 32.74 kg/m   Body mass index is 32.74 kg/m.   Tobacco History  Smoking Status  . Never Smoker  Smokeless Tobacco  . Never Used     Counseling given: Not Answered   Past Medical History:  Diagnosis Date  . Essential hypertension   . History of chicken pox   . Hypercholesterolemia    Past Surgical History:  Procedure Laterality Date  . cataract surgery     bilateral  . COLONOSCOPY WITH PROPOFOL N/A 12/03/2016   Procedure: COLONOSCOPY WITH PROPOFOL;  Surgeon: Sheri Silvas, MD;  Location: [facility] ENDOSCOPY;  Service: Endoscopy;  Laterality: N/A;   Family History  Problem Relation Age of Onset  . Hypertension Mother   . Congestive Heart Failure Mother   . Colon cancer Brother    History  Sexual Activity  . Sexual activity: No    Outpatient Encounter Prescriptions as of 07/30/2017  Medication Sig  . clotrimazole (LOTRIMIN) 1 % external solution Apply 1 application topically 2 (two) times daily. In between toes on left foot  . rosuvastatin (CRESTOR) 10 MG tablet Take 1 tablet (10 mg total) by mouth daily.  Marland Kitchen losartan-hydrochlorothiazide (HYZAAR) 100-12.5 MG tablet Take 1 tablet by mouth daily.   Facility-Administered Encounter Medications as of 07/30/2017  Medication  . triamcinolone acetonide (KENALOG) 10 MG/ML injection 10 mg    Activities of Daily Living In your present state of health, do you  have any difficulty performing the following activities: 07/30/2017  Hearing? N  Vision? N  Difficulty concentrating or making decisions? N  Walking or climbing stairs? N  Dressing or bathing? N  Doing errands, shopping? N  Preparing Food and eating ? N  Using the Toilet? N  In the past six months, have you accidently leaked urine? N  Do you have problems with loss of bowel control? N  Managing your Medications? N  Managing your Finances? N  Housekeeping or managing your Housekeeping? N  Some recent data might be hidden    Patient Care Team: Sheri Pheasant, MD as PCP - General (Internal Medicine)    Assessment:    This is a routine wellness examination for Sheri Cook. The goal of the wellness visit is to assist the patient how to close the gaps in care and create a preventative care plan for the patient.   The roster of all physicians providing medical care to patient is listed in the Snapshot section of the chart.  Osteoporosis risk reviewed.    Safety issues reviewed; Smoke and carbon monoxide detectors in the home. No firearms in the home.  Wears seatbelts when driving or riding with others. Patient does wear sunscreen or protective clothing when in direct sunlight. No violence in the home.  Depression- PHQ 2 &9 complete.  No signs/symptoms or verbal communication regarding little pleasure in doing things, feeling down,  depressed or hopeless. No changes in sleeping, energy, eating, concentrating.  No thoughts of self harm or harm towards others.  Time spent on this topic is 10 minutes.   Patient is alert, normal appearance, oriented to person/place/and time.  Correctly identified the president of the [place], recall of 3/3 words, and performing simple calculations. Displays appropriate judgement and can read correct time from watch face.   No new identified risk were noted.  No failures at ADL's or IADL's.    BMI- discussed the importance of a healthy diet, water intake and the  benefits of aerobic exercise. Educational material provided.   24 hour diet recall: Breakfast: smoked sausage, bread Lunch: 2 pork chop, cabbage, green beans , cream potatoes, 1 fried piece of chicken, peas Dinner: bananna Snack:chips, grapes, 2 nab  Daily fluid intake:  0 cups of caffeine,  4-6 cups of water  Dental- every  6 months.  Eye- Visual acuity not assessed per patient preference since they have regular follow up with the ophthalmologist.  Wears corrective lenses when reading..  Sleep patterns- Sleeps 3-6 hours at night.  Works second shift.  Naps during the day.  High dose influenza administered  L deltoid, tolerated well. Educational material provided.  TDAP vaccine deferred per patient preference.  Follow up with insurance.  Educational material provided.  Patient Concerns: None at this time. Follow up with PCP as needed.  Exercise Activities and Dietary recommendations Current Exercise Habits: Structured exercise class, Type of exercise: calisthenics;treadmill, Time (Minutes): 60, Frequency (Times/Week): 3, Weekly Exercise (Minutes/Week): 180, Intensity: Mild  Goals    . Portion control          Make healthier choices when dining out and cooking at home.  Choose lean meats, fresh vegetables and fruits.         Fall Risk Fall Risk  07/30/2017 08/11/2016 07/28/2016 07/27/2015 07/13/2015  Falls in the past year? No No No Yes Yes  Number falls in past yr: - - - 1 1  Injury with Fall? - - - No No  Follow up - - - Education provided;Falls prevention discussed -   Depression Screen PHQ 2/9 Scores 07/30/2017 08/11/2016 07/28/2016 07/27/2015  PHQ - 2 Score 0 0 0 0  PHQ- 9 Score 0 - - -     Cognitive Function MMSE - Mini Mental State Exam 07/30/2017 07/28/2016 07/27/2015  Orientation to time 5 5 5   Orientation to Place 5 5 5   Registration 3 3 3   Attention/ Calculation 4 5 5   Attention/Calculation-comments Difficulty subtracting simple calculations - -  Recall 3 2 3     Language- name 2 objects 2 2 2   Language- repeat 1 1 1   Language- follow 3 step command 3 3 3   Language- read & follow direction 1 1 1   Write a sentence 1 1 1   Copy design 1 1 1   Total score 29 29 30         Immunization History  Administered Date(s) Administered  . Influenza, High Dose Seasonal PF 07/28/2016, 07/30/2017  . Influenza-Unspecified 08/08/2014, 07/13/2015  . Pneumococcal Conjugate-13 07/27/2015  . Pneumococcal Polysaccharide-23 12/15/2016   Screening Tests Health Maintenance  Topic Date Due  . TETANUS/TDAP  04/22/1960  . MAMMOGRAM  07/27/2018  . INFLUENZA VACCINE  Completed  . DEXA SCAN  Completed  . PNA vac Low Risk Adult  Completed      Plan:    End of life planning; Advance aging; Advanced directives discussed. Copy of current HCPOA/Living Will on file.  I have personally reviewed and noted the following in the patient's chart:   . Medical and social history . Use of alcohol, tobacco or illicit drugs  . Current medications and supplements . Functional ability and status . Nutritional status . Physical activity . Advanced directives . List of other physicians . Hospitalizations, surgeries, and ER visits in previous 12 months . Vitals . Screenings to include cognitive, depression, and falls . Referrals and appointments  In addition, I have reviewed and discussed with patient certain preventive protocols, quality metrics, and best practice recommendations. A written personalized care plan for preventive services as well as general preventive health recommendations were provided to patient.     OBrien-Blaney, Aishah Teffeteller L, LPN  57/0/1779   Reviewed above information.  Agree with assessment and plan.  Dr Nicki Reaper

## 2017-08-21 ENCOUNTER — Other Ambulatory Visit (INDEPENDENT_AMBULATORY_CARE_PROVIDER_SITE_OTHER): Payer: Medicare Other

## 2017-08-21 DIAGNOSIS — I10 Essential (primary) hypertension: Secondary | ICD-10-CM | POA: Diagnosis not present

## 2017-08-21 DIAGNOSIS — E7800 Pure hypercholesterolemia, unspecified: Secondary | ICD-10-CM

## 2017-08-21 LAB — CBC WITH DIFFERENTIAL/PLATELET
Basophils Absolute: 0 10*3/uL (ref 0.0–0.1)
Basophils Relative: 0.4 % (ref 0.0–3.0)
Eosinophils Absolute: 0.2 10*3/uL (ref 0.0–0.7)
Eosinophils Relative: 4.2 % (ref 0.0–5.0)
HCT: 39.4 % (ref 36.0–46.0)
Hemoglobin: 12.8 g/dL (ref 12.0–15.0)
Lymphocytes Relative: 31.8 % (ref 12.0–46.0)
Lymphs Abs: 1.2 10*3/uL (ref 0.7–4.0)
MCHC: 32.5 g/dL (ref 30.0–36.0)
MCV: 94.4 fl (ref 78.0–100.0)
Monocytes Absolute: 0.3 10*3/uL (ref 0.1–1.0)
Monocytes Relative: 7.8 % (ref 3.0–12.0)
Neutro Abs: 2.2 10*3/uL (ref 1.4–7.7)
Neutrophils Relative %: 55.8 % (ref 43.0–77.0)
Platelets: 250 10*3/uL (ref 150.0–400.0)
RBC: 4.17 Mil/uL (ref 3.87–5.11)
RDW: 14.3 % (ref 11.5–15.5)
WBC: 3.9 10*3/uL — ABNORMAL LOW (ref 4.0–10.5)

## 2017-08-21 LAB — BASIC METABOLIC PANEL WITH GFR
BUN: 34 mg/dL — ABNORMAL HIGH (ref 6–23)
CO2: 28 meq/L (ref 19–32)
Calcium: 9.8 mg/dL (ref 8.4–10.5)
Chloride: 108 meq/L (ref 96–112)
Creatinine, Ser: 1.2 mg/dL (ref 0.40–1.20)
GFR: 56.12 mL/min — ABNORMAL LOW
Glucose, Bld: 101 mg/dL — ABNORMAL HIGH (ref 70–99)
Potassium: 4.1 meq/L (ref 3.5–5.1)
Sodium: 142 meq/L (ref 135–145)

## 2017-08-21 LAB — HEPATIC FUNCTION PANEL
ALK PHOS: 66 U/L (ref 39–117)
ALT: 27 U/L (ref 0–35)
AST: 24 U/L (ref 0–37)
Albumin: 4.3 g/dL (ref 3.5–5.2)
BILIRUBIN DIRECT: 0 mg/dL (ref 0.0–0.3)
TOTAL PROTEIN: 6.8 g/dL (ref 6.0–8.3)
Total Bilirubin: 0.5 mg/dL (ref 0.2–1.2)

## 2017-08-21 LAB — LIPID PANEL
CHOL/HDL RATIO: 4
Cholesterol: 141 mg/dL (ref 0–200)
HDL: 39.8 mg/dL (ref >39.00)
LDL Cholesterol: 80 mg/dL (ref 0–99)
NONHDL: 100.81
Triglycerides: 103 mg/dL (ref 0.0–149.0)
VLDL: 20.6 mg/dL (ref 0.0–40.0)

## 2017-08-21 LAB — TSH: TSH: 2.33 u[IU]/mL (ref 0.35–4.50)

## 2017-08-24 ENCOUNTER — Ambulatory Visit (INDEPENDENT_AMBULATORY_CARE_PROVIDER_SITE_OTHER): Payer: Medicare Other | Admitting: Internal Medicine

## 2017-08-24 ENCOUNTER — Encounter: Payer: Self-pay | Admitting: Internal Medicine

## 2017-08-24 VITALS — BP 134/68 | HR 67 | Temp 98.3°F | Ht 64.0 in | Wt 185.4 lb

## 2017-08-24 DIAGNOSIS — R7989 Other specified abnormal findings of blood chemistry: Secondary | ICD-10-CM | POA: Diagnosis not present

## 2017-08-24 DIAGNOSIS — E7800 Pure hypercholesterolemia, unspecified: Secondary | ICD-10-CM | POA: Diagnosis not present

## 2017-08-24 DIAGNOSIS — I10 Essential (primary) hypertension: Secondary | ICD-10-CM | POA: Diagnosis not present

## 2017-08-24 DIAGNOSIS — Z0000 Encounter for general adult medical examination without abnormal findings: Secondary | ICD-10-CM | POA: Diagnosis not present

## 2017-08-24 NOTE — Assessment & Plan Note (Signed)
Blood pressure on recheck improved.  Continue same medication regimen.  Follow pressures.  Follow metabolic panel.   

## 2017-08-24 NOTE — Assessment & Plan Note (Signed)
Stay hydrated.  Recheck metabolic panel over the next several weeks to confirm stable.

## 2017-08-24 NOTE — Assessment & Plan Note (Addendum)
Physical today 08/24/17.  Mammogram 07/28/17 - Birads I.  Colonoscopy 12/03/16 - diverticulosis and internal hemorrhoids.  Bone density 2016 - normal.

## 2017-08-24 NOTE — Telephone Encounter (Signed)
Completed 07/30/17

## 2017-08-24 NOTE — Progress Notes (Signed)
[name], female   DOB: 1941-06-18, 76 y.o.   MRN: [id number]   [name], female    DOB: [birth year], 76 y.o.   MRN: [id number]  HPI  Patient here for her physical exam.  She reports she is doing well.  Feels good.  Stays active.  Still working.  No chest pain.  No sob.  No acid reflux.  No abdominal pain. Bowels moving.  No urine change.  No vaginal problems.  Taking her medications.  Discussed shingles vaccine.  Had normal bone density in 2016.  She is trying to watch her diet.     Past Medical History:  Diagnosis Date  . Essential hypertension   . History of chicken pox   . Hypercholesterolemia    Past Surgical History:  Procedure Laterality Date  . cataract surgery     bilateral  . COLONOSCOPY WITH PROPOFOL N/A 12/03/2016   Procedure: COLONOSCOPY WITH PROPOFOL;  Surgeon: Manya Silvas, MD;  Location: [facility] ENDOSCOPY;  Service: Endoscopy;  Laterality: N/A;   Family History  Problem Relation Age of Onset  . Hypertension Mother   . Congestive Heart Failure Mother   . Colon cancer Brother    Social History   Social History  . Marital status: Single    Spouse name: N/A  . Number of children: N/A  . Years of education: N/A   Social History Main Topics  . Smoking status: Never Smoker  . Smokeless tobacco: Never Used  . Alcohol use No  . Drug use: No  . Sexual activity: No   Other Topics Concern  . None   Social History Narrative  . None    Outpatient Encounter Prescriptions as of 08/24/2017  Medication Sig  . clotrimazole (LOTRIMIN) 1 % external solution Apply 1 application topically 2 (two) times daily. In between toes on left foot  . losartan-hydrochlorothiazide (HYZAAR) 100-12.5 MG tablet Take 1 tablet by mouth daily.  . rosuvastatin (CRESTOR) 10 MG tablet Take 1 tablet (10 mg total) by mouth daily.   Facility-Administered Encounter Medications as of 08/24/2017  Medication  . triamcinolone acetonide (KENALOG)  10 MG/ML injection 10 mg    Review of Systems  Constitutional: Negative for appetite change and unexpected weight change.  HENT: Negative for congestion and sinus pressure.   Eyes: Negative for pain and visual disturbance.  Respiratory: Negative for cough, chest tightness and shortness of breath.   Cardiovascular: Negative for chest pain, palpitations and leg swelling.  Gastrointestinal: Negative for abdominal pain, diarrhea, nausea and vomiting.  Genitourinary: Negative for difficulty urinating and dysuria.  Musculoskeletal: Negative for back pain and joint swelling.  Skin: Negative for color change and rash.  Neurological: Negative for dizziness, light-headedness and headaches.  Hematological: Negative for adenopathy. Does not bruise/bleed easily.  Psychiatric/Behavioral: Negative for agitation and dysphoric mood.       Objective:     Blood pressure rechecked by me:  134/68  Physical Exam  Constitutional: She is oriented to person, place, and time. She appears well-developed and well-nourished. No distress.  HENT:  Nose: Nose normal.  Mouth/Throat: Oropharynx is clear and moist.  Eyes: Right eye exhibits no discharge. Left eye exhibits no discharge. No scleral icterus.  Neck: Neck supple. No thyromegaly present.  Cardiovascular: Normal rate and regular rhythm.   Pulmonary/Chest: Breath sounds normal. No accessory muscle usage. No tachypnea. No respiratory distress. She has no decreased breath sounds. She has no wheezes. She has no rhonchi. Right  breast exhibits no inverted nipple, no mass, no nipple discharge and no tenderness (no axillary adenopathy). Left breast exhibits no inverted nipple, no mass, no nipple discharge and no tenderness (no axilarry adenopathy).  Abdominal: Soft. Bowel sounds are normal. There is no tenderness.  Musculoskeletal: She exhibits no edema or tenderness.  Lymphadenopathy:    She has no cervical adenopathy.  Neurological: She is alert and oriented to  person, place, and time.  Skin: Skin is warm. No rash noted. No erythema.  Psychiatric: She has a normal mood and affect. Her behavior is normal.    BP 134/68   Pulse 67   Temp 98.3 F (36.8 C) (Oral)   Ht 5\' 4"  (1.626 m)   Wt 185 lb 6.4 oz (84.1 kg)   BMI 31.82 kg/m  Wt Readings from Last 3 Encounters:  08/24/17 185 lb 6.4 oz (84.1 kg)  07/30/17 184 lb 12.8 oz (83.8 kg)  04/21/17 182 lb 3.2 oz (82.6 kg)     Lab Results  Component Value Date   WBC 3.9 (L) 08/21/2017   HGB 12.8 08/21/2017   HCT 39.4 08/21/2017   PLT 250.0 08/21/2017   GLUCOSE 101 (H) 08/21/2017   CHOL 141 08/21/2017   TRIG 103.0 08/21/2017   HDL 39.80 08/21/2017   LDLCALC 80 08/21/2017   ALT 27 08/21/2017   AST 24 08/21/2017   NA 142 08/21/2017   K 4.1 08/21/2017   CL 108 08/21/2017   CREATININE 1.20 08/21/2017   BUN 34 (H) 08/21/2017   CO2 28 08/21/2017   TSH 2.33 08/21/2017    Mm Screening Breast Tomo Bilateral  Result Date: 07/27/2017 CLINICAL DATA:  Screening. EXAM: 2D DIGITAL SCREENING BILATERAL MAMMOGRAM WITH CAD AND ADJUNCT TOMO COMPARISON:  Previous exam(s). ACR Breast Density Category c: The breast tissue is heterogeneously dense, which may obscure small masses. FINDINGS: There are no findings suspicious for malignancy. Images were processed with CAD. IMPRESSION: No mammographic evidence of malignancy. A result letter of this screening mammogram will be mailed directly to the patient. RECOMMENDATION: Screening mammogram in one year. (Code:SM-B-01Y) BI-RADS CATEGORY  1: Negative. Electronically Signed   By: Lajean Manes M.D.   On: 07/27/2017 08:33       Assessment & Plan:   Problem List Items Addressed This Visit    Elevated serum creatinine    Stay hydrated.  Recheck metabolic panel over the next several weeks to confirm stable.        Relevant Orders   Basic metabolic panel   Health care maintenance    Physical today 08/24/17.  Mammogram 07/28/17 - Birads I.  Colonoscopy 12/03/16 -  diverticulosis and internal hemorrhoids.  Bone density 2016 - normal.        Hypercholesterolemia    On crestor.  Low cholesterol diet and exercise.  Lipid panel reviewed.   Lab Results  Component Value Date   CHOL 141 08/21/2017   HDL 39.80 08/21/2017   LDLCALC 80 08/21/2017   TRIG 103.0 08/21/2017   CHOLHDL 4 08/21/2017        Hypertension, essential    Blood pressure on recheck improved.  Continue same medication regimen.  Follow pressures.  Follow metabolic panel.         Other Visit Diagnoses    Routine general medical examination at a health care facility    -  Primary       Einar Pheasant, MD

## 2017-08-24 NOTE — Assessment & Plan Note (Signed)
On crestor.  Low cholesterol diet and exercise.  Lipid panel reviewed.   Lab Results  Component Value Date   CHOL 141 08/21/2017   HDL 39.80 08/21/2017   LDLCALC 80 08/21/2017   TRIG 103.0 08/21/2017   CHOLHDL 4 08/21/2017

## 2017-09-08 DIAGNOSIS — H40023 Open angle with borderline findings, high risk, bilateral: Secondary | ICD-10-CM | POA: Diagnosis not present

## 2017-09-08 DIAGNOSIS — H524 Presbyopia: Secondary | ICD-10-CM | POA: Diagnosis not present

## 2017-09-24 ENCOUNTER — Other Ambulatory Visit (INDEPENDENT_AMBULATORY_CARE_PROVIDER_SITE_OTHER): Payer: Medicare Other

## 2017-09-24 ENCOUNTER — Telehealth: Payer: Self-pay

## 2017-09-24 DIAGNOSIS — R7989 Other specified abnormal findings of blood chemistry: Secondary | ICD-10-CM

## 2017-09-24 LAB — BASIC METABOLIC PANEL
BUN: 25 mg/dL — ABNORMAL HIGH (ref 6–23)
CALCIUM: 9.6 mg/dL (ref 8.4–10.5)
CO2: 26 meq/L (ref 19–32)
CREATININE: 1.05 mg/dL (ref 0.40–1.20)
Chloride: 108 mEq/L (ref 96–112)
GFR: 65.46 mL/min (ref >60.00)
GLUCOSE: 111 mg/dL — AB (ref 70–99)
Potassium: 4 mEq/L (ref 3.5–5.1)
Sodium: 140 mEq/L (ref 135–145)

## 2017-09-24 NOTE — Telephone Encounter (Signed)
Please call pharmacy and see if just at their pharmacy.  May need to get from another pharmacy.  Can send rx to another pharmacy.

## 2017-09-24 NOTE — Telephone Encounter (Signed)
[facility] was unsure of other pharmacies, called [facility], they have Losartan in stock. Call pt and left message to call back to see which location she would prefer. CRM started.

## 2017-09-24 NOTE — Telephone Encounter (Signed)
Pharmacy sent over fax saying Losartan is on back order.

## 2017-09-25 ENCOUNTER — Encounter: Payer: Self-pay | Admitting: *Deleted

## 2017-09-25 ENCOUNTER — Other Ambulatory Visit: Payer: Self-pay

## 2017-09-25 MED ORDER — LOSARTAN POTASSIUM-HCTZ 100-12.5 MG PO TABS: 1.0000 | ORAL_TABLET | Freq: Every day | ORAL | 1 refills | Status: DC

## 2017-09-25 NOTE — Telephone Encounter (Signed)
Rx sent to [facility] on [address]

## 2017-09-25 NOTE — Telephone Encounter (Signed)
Pt called back  [facility]aid [facility] on [address]

## 2017-09-28 ENCOUNTER — Telehealth: Payer: Self-pay | Admitting: Internal Medicine

## 2017-09-28 NOTE — Telephone Encounter (Signed)
Copied from [place] [id number]. Topic: Quick Communication - See Telephone Encounter >> Sep 28, 2017 10:08 AM Arletha Grippe wrote: CRM for notification. See Telephone encounter for:   09/28/17. Pt is returning call about labs, please call [phone]. Pt would like to have call at 1 pm.

## 2017-09-28 NOTE — Telephone Encounter (Signed)
Patient notified and voiced understanding.

## 2017-12-24 ENCOUNTER — Other Ambulatory Visit: Payer: Self-pay | Admitting: Internal Medicine

## 2017-12-28 ENCOUNTER — Other Ambulatory Visit: Payer: Self-pay | Admitting: Internal Medicine

## 2017-12-28 ENCOUNTER — Telehealth: Payer: Self-pay | Admitting: Radiology

## 2017-12-28 DIAGNOSIS — I10 Essential (primary) hypertension: Secondary | ICD-10-CM

## 2017-12-28 DIAGNOSIS — R739 Hyperglycemia, unspecified: Secondary | ICD-10-CM

## 2017-12-28 DIAGNOSIS — E7800 Pure hypercholesterolemia, unspecified: Secondary | ICD-10-CM

## 2017-12-28 NOTE — Progress Notes (Signed)
Order placed for f/u labs.  

## 2017-12-28 NOTE — Telephone Encounter (Signed)
Order placed for f/u labs.  

## 2017-12-28 NOTE — Telephone Encounter (Signed)
Pt coming in for labs tomorrow, please place future orders. Thank you.  

## 2017-12-29 ENCOUNTER — Other Ambulatory Visit (INDEPENDENT_AMBULATORY_CARE_PROVIDER_SITE_OTHER): Payer: Medicare Other

## 2017-12-29 DIAGNOSIS — I10 Essential (primary) hypertension: Secondary | ICD-10-CM

## 2017-12-29 DIAGNOSIS — R739 Hyperglycemia, unspecified: Secondary | ICD-10-CM

## 2017-12-29 DIAGNOSIS — E7800 Pure hypercholesterolemia, unspecified: Secondary | ICD-10-CM

## 2017-12-29 LAB — HEPATIC FUNCTION PANEL
ALBUMIN: 4 g/dL (ref 3.5–5.2)
ALT: 24 U/L (ref 0–35)
AST: 20 U/L (ref 0–37)
Alkaline Phosphatase: 69 U/L (ref 39–117)
Bilirubin, Direct: 0.1 mg/dL (ref 0.0–0.3)
Total Bilirubin: 0.4 mg/dL (ref 0.2–1.2)
Total Protein: 6.5 g/dL (ref 6.0–8.3)

## 2017-12-29 LAB — CBC WITH DIFFERENTIAL/PLATELET
BASOS PCT: 0.4 % (ref 0.0–3.0)
Basophils Absolute: 0 10*3/uL (ref 0.0–0.1)
EOS PCT: 3.4 % (ref 0.0–5.0)
Eosinophils Absolute: 0.1 10*3/uL (ref 0.0–0.7)
HCT: 37.5 % (ref 36.0–46.0)
Hemoglobin: 12.5 g/dL (ref 12.0–15.0)
LYMPHS ABS: 1.2 10*3/uL (ref 0.7–4.0)
Lymphocytes Relative: 34.4 % (ref 12.0–46.0)
MCHC: 33.3 g/dL (ref 30.0–36.0)
MCV: 93.2 fl (ref 78.0–100.0)
Monocytes Absolute: 0.2 10*3/uL (ref 0.1–1.0)
Monocytes Relative: 6.9 % (ref 3.0–12.0)
NEUTROS ABS: 2 10*3/uL (ref 1.4–7.7)
NEUTROS PCT: 54.9 % (ref 43.0–77.0)
PLATELETS: 240 10*3/uL (ref 150.0–400.0)
RBC: 4.02 Mil/uL (ref 3.87–5.11)
RDW: 15.1 % (ref 11.5–15.5)
WBC: 3.6 10*3/uL — ABNORMAL LOW (ref 4.0–10.5)

## 2017-12-29 LAB — HEMOGLOBIN A1C: HEMOGLOBIN A1C: 6.3 % (ref 4.6–6.5)

## 2017-12-29 LAB — BASIC METABOLIC PANEL
BUN: 21 mg/dL (ref 6–23)
CHLORIDE: 112 meq/L (ref 96–112)
CO2: 26 meq/L (ref 19–32)
Calcium: 9.7 mg/dL (ref 8.4–10.5)
Creatinine, Ser: 1.01 mg/dL (ref 0.40–1.20)
GFR: 68.41 mL/min (ref >60.00)
GLUCOSE: 90 mg/dL (ref 70–99)
POTASSIUM: 4.1 meq/L (ref 3.5–5.1)
SODIUM: 143 meq/L (ref 135–145)

## 2017-12-29 LAB — LIPID PANEL
CHOLESTEROL: 127 mg/dL (ref 0–200)
HDL: 39.4 mg/dL (ref >39.00)
LDL CALC: 74 mg/dL (ref 0–99)
NONHDL: 87.99
Total CHOL/HDL Ratio: 3
Triglycerides: 70 mg/dL (ref 0.0–149.0)
VLDL: 14 mg/dL (ref 0.0–40.0)

## 2017-12-30 ENCOUNTER — Ambulatory Visit: Payer: Medicare Other | Admitting: Internal Medicine

## 2017-12-30 ENCOUNTER — Encounter: Payer: Self-pay | Admitting: Internal Medicine

## 2017-12-30 VITALS — BP 150/72 | HR 62 | Temp 98.2°F | Resp 18 | Wt 182.0 lb

## 2017-12-30 DIAGNOSIS — R739 Hyperglycemia, unspecified: Secondary | ICD-10-CM | POA: Diagnosis not present

## 2017-12-30 DIAGNOSIS — D72819 Decreased white blood cell count, unspecified: Secondary | ICD-10-CM | POA: Diagnosis not present

## 2017-12-30 DIAGNOSIS — I10 Essential (primary) hypertension: Secondary | ICD-10-CM | POA: Diagnosis not present

## 2017-12-30 DIAGNOSIS — E7800 Pure hypercholesterolemia, unspecified: Secondary | ICD-10-CM

## 2017-12-30 NOTE — Progress Notes (Signed)
[name], female   DOB: 1941-04-27, 77 y.o.   MRN: [id number]   [name], female    DOB: Oct 04, 1941, 77 y.o.   MRN: [id number]  HPI  Patient here for a scheduled follow up.  She reports she is doing well.  Feels good.  Still working.  No chest pain.  No sob.  No acid reflux.  No abdominal pain.  Bowels moving.  She has been out of her blood pressure medication for the last week.  Plans to go pick up rx today.     Past Medical History:  Diagnosis Date  . Essential hypertension   . History of chicken pox   . Hypercholesterolemia    Past Surgical History:  Procedure Laterality Date  . cataract surgery     bilateral  . COLONOSCOPY WITH PROPOFOL N/A 12/03/2016   Procedure: COLONOSCOPY WITH PROPOFOL;  Surgeon: Manya Silvas, MD;  Location: [facility] ENDOSCOPY;  Service: Endoscopy;  Laterality: N/A;   Family History  Problem Relation Age of Onset  . Hypertension Mother   . Congestive Heart Failure Mother   . Colon cancer Brother    Social History   Socioeconomic History  . Marital status: Single    Spouse name: None  . Number of children: None  . Years of education: None  . Highest education level: None  Social Needs  . Financial resource strain: None  . Food insecurity - worry: None  . Food insecurity - inability: None  . Transportation needs - medical: None  . Transportation needs - non-medical: None  Occupational History  . None  Tobacco Use  . Smoking status: Never Smoker  . Smokeless tobacco: Never Used  Substance and Sexual Activity  . Alcohol use: No    Alcohol/week: 0.0 oz  . Drug use: No  . Sexual activity: No  Other Topics Concern  . None  Social History Narrative  . None    Outpatient Encounter Medications as of 12/30/2017  Medication Sig  . clotrimazole (LOTRIMIN) 1 % external solution Apply 1 application topically 2 (two) times daily. In between toes on left foot  . losartan-hydrochlorothiazide (HYZAAR)  100-12.5 MG tablet TAKE 1 TABLET BY MOUTH EVERY DAY  . rosuvastatin (CRESTOR) 10 MG tablet Take 1 tablet (10 mg total) by mouth daily.   Facility-Administered Encounter Medications as of 12/30/2017  Medication  . triamcinolone acetonide (KENALOG) 10 MG/ML injection 10 mg    Review of Systems  Constitutional: Negative for appetite change and unexpected weight change.  HENT: Negative for congestion and sinus pressure.   Respiratory: Negative for cough, chest tightness and shortness of breath.   Cardiovascular: Negative for chest pain, palpitations and leg swelling.  Gastrointestinal: Negative for abdominal pain, diarrhea, nausea and vomiting.  Genitourinary: Negative for difficulty urinating and dysuria.  Musculoskeletal: Negative for joint swelling and myalgias.  Skin: Negative for color change and rash.  Neurological: Negative for dizziness, light-headedness and headaches.  Psychiatric/Behavioral: Negative for agitation and dysphoric mood.       Objective:     Blood pressure rechecked by me:  150/72  Physical Exam  Constitutional: She appears well-developed and well-nourished. No distress.  HENT:  Nose: Nose normal.  Mouth/Throat: Oropharynx is clear and moist.  Neck: Neck supple. No thyromegaly present.  Cardiovascular: Normal rate and regular rhythm.  Pulmonary/Chest: Breath sounds normal. No respiratory distress. She has no wheezes.  Abdominal: Soft. Bowel sounds are normal. There is no tenderness.  Musculoskeletal:  She exhibits no edema or tenderness.  Lymphadenopathy:    She has no cervical adenopathy.  Skin: No rash noted. No erythema.  Psychiatric: She has a normal mood and affect. Her behavior is normal.    BP (!) 150/72   Pulse 62   Temp 98.2 F (36.8 C) (Oral)   Resp 18   Wt 182 lb (82.6 kg)   SpO2 99%   BMI 31.24 kg/m  Wt Readings from Last 3 Encounters:  12/30/17 182 lb (82.6 kg)  08/24/17 185 lb 6.4 oz (84.1 kg)  07/30/17 184 lb 12.8 oz (83.8 kg)      Lab Results  Component Value Date   WBC 3.6 (L) 12/29/2017   HGB 12.5 12/29/2017   HCT 37.5 12/29/2017   PLT 240.0 12/29/2017   GLUCOSE 90 12/29/2017   CHOL 127 12/29/2017   TRIG 70.0 12/29/2017   HDL 39.40 12/29/2017   LDLCALC 74 12/29/2017   ALT 24 12/29/2017   AST 20 12/29/2017   NA 143 12/29/2017   K 4.1 12/29/2017   CL 112 12/29/2017   CREATININE 1.01 12/29/2017   BUN 21 12/29/2017   CO2 26 12/29/2017   TSH 2.33 08/21/2017   HGBA1C 6.3 12/29/2017    Mm Screening Breast Tomo Bilateral  Result Date: 07/27/2017 CLINICAL DATA:  Screening. EXAM: 2D DIGITAL SCREENING BILATERAL MAMMOGRAM WITH CAD AND ADJUNCT TOMO COMPARISON:  Previous exam(s). ACR Breast Density Category c: The breast tissue is heterogeneously dense, which may obscure small masses. FINDINGS: There are no findings suspicious for malignancy. Images were processed with CAD. IMPRESSION: No mammographic evidence of malignancy. A result letter of this screening mammogram will be mailed directly to the patient. RECOMMENDATION: Screening mammogram in one year. (Code:SM-B-01Y) BI-RADS CATEGORY  1: Negative. Electronically Signed   By: Lajean Manes M.D.   On: 07/27/2017 08:33       Assessment & Plan:   Problem List Items Addressed This Visit    Hypercholesterolemia    On crestor.  Low cholesterol diet and exercise.  Follow lipid panel and liver function tests.        Relevant Orders   Hepatic function panel   Lipid panel   Hyperglycemia    Low carb diet and exercise.  Follow met b and a1c.       Relevant Orders   Hemoglobin W2X   Basic metabolic panel   Hypertension, essential    Out of her blood pressure medication for the last week.  Blood pressure elevated today as outlined.  Restart her losartan/hctz.  Follow pressures.  Follow metabolic panel.  Had been under good control on the medication.         Other Visit Diagnoses    Leukopenia, unspecified type    -  Primary   Relevant Orders   CBC with  Differential/Platelet       Einar Pheasant, MD

## 2018-01-02 ENCOUNTER — Encounter: Payer: Self-pay | Admitting: Internal Medicine

## 2018-01-02 DIAGNOSIS — E1165 Type 2 diabetes mellitus with hyperglycemia: Secondary | ICD-10-CM | POA: Insufficient documentation

## 2018-01-02 DIAGNOSIS — E119 Type 2 diabetes mellitus without complications: Secondary | ICD-10-CM | POA: Insufficient documentation

## 2018-01-02 NOTE — Assessment & Plan Note (Signed)
Low carb diet and exercise.  Follow met b and a1c.  

## 2018-01-02 NOTE — Assessment & Plan Note (Signed)
Out of her blood pressure medication for the last week.  Blood pressure elevated today as outlined.  Restart her losartan/hctz.  Follow pressures.  Follow metabolic panel.  Had been under good control on the medication.

## 2018-01-02 NOTE — Assessment & Plan Note (Signed)
On crestor.  Low cholesterol diet and exercise.  Follow lipid panel and liver function tests.   

## 2018-01-21 DIAGNOSIS — M25551 Pain in right hip: Secondary | ICD-10-CM | POA: Diagnosis not present

## 2018-01-21 DIAGNOSIS — M7918 Myalgia, other site: Secondary | ICD-10-CM | POA: Diagnosis not present

## 2018-03-09 DIAGNOSIS — H40023 Open angle with borderline findings, high risk, bilateral: Secondary | ICD-10-CM | POA: Diagnosis not present

## 2018-05-03 ENCOUNTER — Other Ambulatory Visit (INDEPENDENT_AMBULATORY_CARE_PROVIDER_SITE_OTHER): Payer: Medicare Other

## 2018-05-03 DIAGNOSIS — D72819 Decreased white blood cell count, unspecified: Secondary | ICD-10-CM

## 2018-05-03 DIAGNOSIS — E7800 Pure hypercholesterolemia, unspecified: Secondary | ICD-10-CM | POA: Diagnosis not present

## 2018-05-03 DIAGNOSIS — R739 Hyperglycemia, unspecified: Secondary | ICD-10-CM

## 2018-05-03 LAB — BASIC METABOLIC PANEL
BUN: 37 mg/dL — AB (ref 6–23)
CHLORIDE: 110 meq/L (ref 96–112)
CO2: 24 meq/L (ref 19–32)
CREATININE: 1.43 mg/dL — AB (ref 0.40–1.20)
Calcium: 9.5 mg/dL (ref 8.4–10.5)
GFR: 45.76 mL/min — ABNORMAL LOW (ref >60.00)
GLUCOSE: 110 mg/dL — AB (ref 70–99)
POTASSIUM: 4.3 meq/L (ref 3.5–5.1)
Sodium: 142 mEq/L (ref 135–145)

## 2018-05-03 LAB — CBC WITH DIFFERENTIAL/PLATELET
BASOS PCT: 0.6 % (ref 0.0–3.0)
Basophils Absolute: 0 10*3/uL (ref 0.0–0.1)
EOS ABS: 0.1 10*3/uL (ref 0.0–0.7)
EOS PCT: 2.3 % (ref 0.0–5.0)
HEMATOCRIT: 35.9 % — AB (ref 36.0–46.0)
HEMOGLOBIN: 12.4 g/dL (ref 12.0–15.0)
LYMPHS PCT: 35.9 % (ref 12.0–46.0)
Lymphs Abs: 1.3 10*3/uL (ref 0.7–4.0)
MCHC: 34.5 g/dL (ref 30.0–36.0)
MCV: 91.6 fl (ref 78.0–100.0)
MONOS PCT: 8.3 % (ref 3.0–12.0)
Monocytes Absolute: 0.3 10*3/uL (ref 0.1–1.0)
Neutro Abs: 2 10*3/uL (ref 1.4–7.7)
Neutrophils Relative %: 52.9 % (ref 43.0–77.0)
Platelets: 215 10*3/uL (ref 150.0–400.0)
RBC: 3.92 Mil/uL (ref 3.87–5.11)
RDW: 15.1 % (ref 11.5–15.5)
WBC: 3.7 10*3/uL — AB (ref 4.0–10.5)

## 2018-05-03 LAB — HEPATIC FUNCTION PANEL
ALK PHOS: 58 U/L (ref 39–117)
ALT: 26 U/L (ref 0–35)
AST: 21 U/L (ref 0–37)
Albumin: 4 g/dL (ref 3.5–5.2)
BILIRUBIN DIRECT: 0.1 mg/dL (ref 0.0–0.3)
Total Bilirubin: 0.3 mg/dL (ref 0.2–1.2)
Total Protein: 6.3 g/dL (ref 6.0–8.3)

## 2018-05-03 LAB — LIPID PANEL
CHOL/HDL RATIO: 4
Cholesterol: 151 mg/dL (ref 0–200)
HDL: 42.5 mg/dL (ref >39.00)
LDL CALC: 96 mg/dL (ref 0–99)
NONHDL: 108.23
Triglycerides: 63 mg/dL (ref 0.0–149.0)
VLDL: 12.6 mg/dL (ref 0.0–40.0)

## 2018-05-03 LAB — HEMOGLOBIN A1C: HEMOGLOBIN A1C: 6.6 % — AB (ref 4.6–6.5)

## 2018-05-04 ENCOUNTER — Ambulatory Visit: Payer: Medicare Other | Admitting: Internal Medicine

## 2018-05-04 ENCOUNTER — Encounter: Payer: Self-pay | Admitting: Internal Medicine

## 2018-05-04 VITALS — BP 138/72 | HR 65 | Temp 98.2°F | Resp 15 | Wt 184.4 lb

## 2018-05-04 DIAGNOSIS — I10 Essential (primary) hypertension: Secondary | ICD-10-CM | POA: Diagnosis not present

## 2018-05-04 DIAGNOSIS — R7989 Other specified abnormal findings of blood chemistry: Secondary | ICD-10-CM

## 2018-05-04 DIAGNOSIS — E119 Type 2 diabetes mellitus without complications: Secondary | ICD-10-CM | POA: Diagnosis not present

## 2018-05-04 DIAGNOSIS — E7800 Pure hypercholesterolemia, unspecified: Secondary | ICD-10-CM | POA: Diagnosis not present

## 2018-05-04 LAB — BASIC METABOLIC PANEL WITH GFR
BUN: 29 mg/dL — ABNORMAL HIGH (ref 6–23)
CO2: 27 meq/L (ref 19–32)
Calcium: 9.8 mg/dL (ref 8.4–10.5)
Chloride: 109 meq/L (ref 96–112)
Creatinine, Ser: 1.15 mg/dL (ref 0.40–1.20)
GFR: 58.84 mL/min — ABNORMAL LOW
Glucose, Bld: 97 mg/dL (ref 70–99)
Potassium: 4.1 meq/L (ref 3.5–5.1)
Sodium: 142 meq/L (ref 135–145)

## 2018-05-04 LAB — URINALYSIS, ROUTINE W REFLEX MICROSCOPIC
BILIRUBIN URINE: NEGATIVE
HGB URINE DIPSTICK: NEGATIVE
Ketones, ur: NEGATIVE
NITRITE: NEGATIVE
RBC / HPF: NONE SEEN (ref 0–?)
Specific Gravity, Urine: <=1.005 — AB (ref 1.000–1.030)
Total Protein, Urine: NEGATIVE
Urine Glucose: NEGATIVE
Urobilinogen, UA: 0.2 (ref 0.0–1.0)
pH: 6 (ref 5.0–8.0)

## 2018-05-04 LAB — HM DIABETES FOOT EXAM

## 2018-05-04 MED ORDER — ROSUVASTATIN CALCIUM 10 MG PO TABS: 10.0000 mg | ORAL_TABLET | Freq: Every day | ORAL | 1 refills | Status: DC

## 2018-05-04 NOTE — Assessment & Plan Note (Addendum)
Low carb diet and exercise.  Follow met b and a1c.  We discussed diabetes - diet controlled.  Discussed Lifestyles referral.  She declines.  Wants to work on diet and exercise.  Follow met b and a1c.

## 2018-05-04 NOTE — Progress Notes (Signed)
[name], female   DOB: [birth year], 77 y.o.   MRN: [id number]   [name], female    DOB: February 19, 1941, 77 y.o.   MRN: [id number]  HPI  Patient here for a scheduled follow up.  She reports she is doing well.  Just returned from a trip to [place].  Worked last night.  Stays active.  No chest pain.  No sob.  No acid reflux.  No abdominal pain.  Bowels moving.  Discussed labs.  Discussed elevated creatinine.  Not taking any antiinflammatories.  Feels good.     Past Medical History:  Diagnosis Date  . Essential hypertension   . History of chicken pox   . Hypercholesterolemia    Past Surgical History:  Procedure Laterality Date  . cataract surgery     bilateral  . COLONOSCOPY WITH PROPOFOL N/A 12/03/2016   Procedure: COLONOSCOPY WITH PROPOFOL;  Surgeon: Manya Silvas, MD;  Location: [facility] ENDOSCOPY;  Service: Endoscopy;  Laterality: N/A;   Family History  Problem Relation Age of Onset  . Hypertension Mother   . Congestive Heart Failure Mother   . Colon cancer Brother    Social History   Socioeconomic History  . Marital status: Single    Spouse name: Not on file  . Number of children: Not on file  . Years of education: Not on file  . Highest education level: Not on file  Occupational History  . Not on file  Social Needs  . Financial resource strain: Not on file  . Food insecurity:    Worry: Not on file    Inability: Not on file  . Transportation needs:    Medical: Not on file    Non-medical: Not on file  Tobacco Use  . Smoking status: Never Smoker  . Smokeless tobacco: Never Used  Substance and Sexual Activity  . Alcohol use: No    Alcohol/week: 0.0 oz  . Drug use: No  . Sexual activity: Never  Lifestyle  . Physical activity:    Days per week: Not on file    Minutes per session: Not on file  . Stress: Not on file  Relationships  . Social connections:    Talks on phone: Not on file    Gets together: Not on file    Attends religious service: Not on file    Active member of club or organization: Not on file    Attends meetings of clubs or organizations: Not on file    Relationship status: Not on file  Other Topics Concern  . Not on file  Social History Narrative  . Not on file    Outpatient Encounter Medications as of 05/04/2018  Medication Sig  . clotrimazole (LOTRIMIN) 1 % external solution Apply 1 application topically 2 (two) times daily. In between toes on left foot  . losartan-hydrochlorothiazide (HYZAAR) 100-12.5 MG tablet TAKE 1 TABLET BY MOUTH EVERY DAY  . rosuvastatin (CRESTOR) 10 MG tablet Take 1 tablet (10 mg total) by mouth daily.  . [DISCONTINUED] rosuvastatin (CRESTOR) 10 MG tablet Take 1 tablet (10 mg total) by mouth daily.   Facility-Administered Encounter Medications as of 05/04/2018  Medication  . triamcinolone acetonide (KENALOG) 10 MG/ML injection 10 mg    Review of Systems  Constitutional: Negative for appetite change and unexpected weight change.  HENT: Negative for congestion and sinus pressure.   Respiratory: Negative for cough, chest tightness and shortness of breath.   Cardiovascular: Negative for chest pain,  palpitations and leg swelling.  Gastrointestinal: Negative for abdominal pain, diarrhea, nausea and vomiting.  Genitourinary: Negative for difficulty urinating and dysuria.  Musculoskeletal: Negative for joint swelling and myalgias.  Skin: Negative for color change and rash.  Neurological: Negative for dizziness, light-headedness and headaches.  Psychiatric/Behavioral: Negative for agitation and dysphoric mood.       Objective:     Blood pressure rechecked by me:  138-140/72  Physical Exam  Constitutional: She appears well-developed and well-nourished. No distress.  HENT:  Nose: Nose normal.  Mouth/Throat: Oropharynx is clear and moist.  Neck: Neck supple. No thyromegaly present.  Cardiovascular: Normal rate and regular rhythm.  1/6 systolic murmur.     Pulmonary/Chest: Breath sounds normal. No respiratory distress. She has no wheezes.  Abdominal: Soft. Bowel sounds are normal. There is no tenderness.  Musculoskeletal: She exhibits no edema or tenderness.  Lymphadenopathy:    She has no cervical adenopathy.  Skin: No rash noted. No erythema.  Psychiatric: She has a normal mood and affect. Her behavior is normal.    BP 138/72   Pulse 65   Temp 98.2 F (36.8 C) (Oral)   Resp 15   Wt 184 lb 6 oz (83.6 kg)   SpO2 97%   BMI 31.65 kg/m  Wt Readings from Last 3 Encounters:  05/04/18 184 lb 6 oz (83.6 kg)  12/30/17 182 lb (82.6 kg)  08/24/17 185 lb 6.4 oz (84.1 kg)     Lab Results  Component Value Date   WBC 3.7 (L) 05/03/2018   HGB 12.4 05/03/2018   HCT 35.9 (L) 05/03/2018   PLT 215.0 05/03/2018   GLUCOSE 110 (H) 05/03/2018   CHOL 151 05/03/2018   TRIG 63.0 05/03/2018   HDL 42.50 05/03/2018   LDLCALC 96 05/03/2018   ALT 26 05/03/2018   AST 21 05/03/2018   NA 142 05/03/2018   K 4.3 05/03/2018   CL 110 05/03/2018   CREATININE 1.43 (H) 05/03/2018   BUN 37 (H) 05/03/2018   CO2 24 05/03/2018   TSH 2.33 08/21/2017   HGBA1C 6.6 (H) 05/03/2018    Mm Screening Breast Tomo Bilateral  Result Date: 07/27/2017 CLINICAL DATA:  Screening. EXAM: 2D DIGITAL SCREENING BILATERAL MAMMOGRAM WITH CAD AND ADJUNCT TOMO COMPARISON:  Previous exam(s). ACR Breast Density Category c: The breast tissue is heterogeneously dense, which may obscure small masses. FINDINGS: There are no findings suspicious for malignancy. Images were processed with CAD. IMPRESSION: No mammographic evidence of malignancy. A result letter of this screening mammogram will be mailed directly to the patient. RECOMMENDATION: Screening mammogram in one year. (Code:SM-B-01Y) BI-RADS CATEGORY  1: Negative. Electronically Signed   By: Lajean Manes M.D.   On: 07/27/2017 08:33       Assessment & Plan:   Problem List Items Addressed This Visit    Diabetes mellitus without  complication ([place])    Low carb diet and exercise.  Follow met b and a1c.  We discussed diabetes - diet controlled.  Discussed Lifestyles referral.  She declines.  Wants to work on diet and exercise.  Follow met b and a1c.        Relevant Medications   rosuvastatin (CRESTOR) 10 MG tablet   Elevated serum creatinine - Primary    Creatinine increased more on recent labs.  Recheck met b and check urinalysis today.  Discussed importance of staying hydrated.  Avoid antiinflammatories.        Relevant Orders   Basic metabolic panel   Urinalysis, Routine w  reflex microscopic   Hypercholesterolemia    On crestor.  Low cholesterol diet and exercise.  Follow lipid panel and liver function tests.        Relevant Medications   rosuvastatin (CRESTOR) 10 MG tablet   Hypertension, essential    Blood pressure overall doing better.  Follow.  Continue same medication regimen.        Relevant Medications   rosuvastatin (CRESTOR) 10 MG tablet       Einar Pheasant, MD

## 2018-05-04 NOTE — Assessment & Plan Note (Signed)
On crestor.  Low cholesterol diet and exercise.  Follow lipid panel and liver function tests.   

## 2018-05-04 NOTE — Assessment & Plan Note (Signed)
Blood pressure overall doing better.  Follow.  Continue same medication regimen.

## 2018-05-04 NOTE — Assessment & Plan Note (Signed)
Creatinine increased more on recent labs.  Recheck met b and check urinalysis today.  Discussed importance of staying hydrated.  Avoid antiinflammatories.

## 2018-06-28 ENCOUNTER — Other Ambulatory Visit: Payer: Self-pay | Admitting: Internal Medicine

## 2018-08-02 ENCOUNTER — Ambulatory Visit (INDEPENDENT_AMBULATORY_CARE_PROVIDER_SITE_OTHER): Payer: Medicare Other

## 2018-08-02 VITALS — BP 128/78 | HR 85 | Temp 98.2°F | Resp 16 | Ht 64.0 in | Wt 182.0 lb

## 2018-08-02 DIAGNOSIS — Z23 Encounter for immunization: Secondary | ICD-10-CM

## 2018-08-02 DIAGNOSIS — Z0000 Encounter for general adult medical examination without abnormal findings: Secondary | ICD-10-CM

## 2018-08-02 NOTE — Progress Notes (Signed)
Subjective:   Sheri Cook is a 77 y.o. female who presents for Medicare Annual (Subsequent) preventive examination.  Review of Systems:  No ROS.  Medicare Wellness Visit. Additional risk factors are reflected in the social history.  Cardiac Risk Factors include: advanced age (>1men, >23 women);hypertension;obesity (BMI >30kg/m2);diabetes mellitus     Objective:     Vitals: BP 128/78 (BP Location: Left Arm, Patient Position: Sitting, Cuff Size: Normal)   Pulse 85   Temp 98.2 F (36.8 C) (Oral)   Resp 16   Ht 5\' 4"  (1.626 m)   Wt 182 lb (82.6 kg)   SpO2 96%   BMI 31.24 kg/m   Body mass index is 31.24 kg/m.  Advanced Directives 08/02/2018 07/30/2017 07/28/2016 07/27/2015  Does Patient Have a Medical Advance Directive? Yes Yes Yes Yes  Type of Advance Directive Living will;Healthcare Power of [place];Living will [place];Living will [place]  Does patient want to make changes to medical advance directive? No - Patient declined No - Patient declined No - Patient declined No - Patient declined  Copy of [place] in Chart? Yes Yes Yes No - copy requested    Tobacco Social History   Tobacco Use  Smoking Status Never Smoker  Smokeless Tobacco Never Used     Counseling given: Not Answered   Clinical Intake:  Pre-visit preparation completed: Yes  Pain : No/denies pain     Nutritional Status: BMI > 30  Obese Diabetes: Yes(Followed by pcp)  How often do you need to have someone help you when you read instructions, pamphlets, or other written materials from your doctor or pharmacy?: 1 - Never  Interpreter Needed?: No     Past Medical History:  Diagnosis Date  . Essential hypertension   . History of chicken pox   . Hypercholesterolemia    Past Surgical History:  Procedure Laterality Date  . cataract surgery     bilateral  . COLONOSCOPY WITH PROPOFOL N/A 12/03/2016   Procedure: COLONOSCOPY WITH PROPOFOL;  Surgeon: Manya Silvas, MD;  Location: [facility] ENDOSCOPY;  Service: Endoscopy;  Laterality: N/A;   Family History  Problem Relation Age of Onset  . Hypertension Mother   . Congestive Heart Failure Mother   . Colon cancer Brother    Social History   Socioeconomic History  . Marital status: Single    Spouse name: Not on file  . Number of children: Not on file  . Years of education: Not on file  . Highest education level: Not on file  Occupational History  . Not on file  Social Needs  . Financial resource strain: Not hard at all  . Food insecurity:    Worry: Never true    Inability: Never true  . Transportation needs:    Medical: No    Non-medical: No  Tobacco Use  . Smoking status: Never Smoker  . Smokeless tobacco: Never Used  Substance and Sexual Activity  . Alcohol use: No    Alcohol/week: 0.0 standard drinks  . Drug use: No  . Sexual activity: Never  Lifestyle  . Physical activity:    Days per week: 3 days    Minutes per session: 60 min  . Stress: Not at all  Relationships  . Social connections:    Talks on phone: Not on file    Gets together: Not on file    Attends religious service: Not on file    Active member of  club or organization: Not on file    Attends meetings of clubs or organizations: Not on file    Relationship status: Not on file  Other Topics Concern  . Not on file  Social History Narrative  . Not on file    Outpatient Encounter Medications as of 08/02/2018  Medication Sig  . clotrimazole (LOTRIMIN) 1 % external solution Apply 1 application topically 2 (two) times daily. In between toes on left foot  . losartan-hydrochlorothiazide (HYZAAR) 100-12.5 MG tablet TAKE 1 TABLET BY MOUTH EVERY DAY  . rosuvastatin (CRESTOR) 10 MG tablet Take 1 tablet (10 mg total) by mouth daily.   Facility-Administered Encounter Medications as of 08/02/2018  Medication  . triamcinolone acetonide (KENALOG) 10 MG/ML injection 10  mg    Activities of Daily Living In your present state of health, do you have any difficulty performing the following activities: 08/02/2018  Hearing? N  Vision? N  Difficulty concentrating or making decisions? N  Walking or climbing stairs? N  Dressing or bathing? N  Doing errands, shopping? N  Preparing Food and eating ? N  Using the Toilet? N  In the past six months, have you accidently leaked urine? N  Do you have problems with loss of bowel control? N  Managing your Medications? N  Managing your Finances? N  Housekeeping or managing your Housekeeping? N  Some recent data might be hidden    Patient Care Team: Einar Pheasant, MD as PCP - General (Internal Medicine)    Assessment:   This is a routine wellness examination for Sheri Cook.  The goal of the wellness visit is to assist the patient how to close the gaps in care and create a preventative care plan for the patient.   The roster of all physicians providing medical care to patient is listed in the Snapshot section of the chart.  Osteoporosis risk reviewed.    Safety issues reviewed; Lives alone. Smoke and carbon monoxide detectors in the home. No firearms in the home. Wears seatbelts when driving or riding with others. No violence in the home.  They do not have excessive sun exposure.  Discussed the need for sun protection: hats, long sleeves and the use of sunscreen if there is significant sun exposure.  No new identified risk were noted.  No failures at ADL's or IADL's.    BMI- discussed the importance of a healthy diet, water intake and the benefits of aerobic exercise. Educational material provided.   24 hour diet recall: Low carb diet  Dental- every 12 months.  Eye- Visual acuity not assessed per patient preference since they have regular follow up with the ophthalmologist.  Wears corrective lenses.  Sleep patterns- Sleeps without issues.   High dose influenza vaccine administered L deltoid, tolerated  well. No verbal complaint during or after administration. Educational material provided.   Mammogram discussed.   TDAP vaccine deferred per patient preference.  Follow up with insurance.  Educational material provided.  Health maintenance gaps- closed.  Patient Concerns: None at this time. Follow up with PCP as needed.  Exercise Activities and Dietary recommendations Current Exercise Habits: Home exercise routine, Type of exercise: calisthenics;treadmill(chair exercises), Time (Minutes): 60, Frequency (Times/Week): 3, Weekly Exercise (Minutes/Week): 180, Intensity: Moderate  Goals      Patient Stated   . Lose Weight (pt-stated)     Healthy low carb diet Stay active  Stay hydrated       Fall Risk Fall Risk  08/02/2018 07/30/2017 08/11/2016 07/28/2016 07/27/2015  Falls in  the past year? No No No No Yes  Number falls in past yr: - - - - 1  Injury with Fall? - - - - No  Follow up - - - - Education provided;Falls prevention discussed   Depression Screen PHQ 2/9 Scores 08/02/2018 07/30/2017 08/11/2016 07/28/2016  PHQ - 2 Score 0 0 0 0  PHQ- 9 Score - 0 - -     Cognitive Function MMSE - Mini Mental State Exam 07/30/2017 07/28/2016 07/27/2015  Orientation to time 5 5 5   Orientation to Place 5 5 5   Registration 3 3 3   Attention/ Calculation 4 5 5   Attention/Calculation-comments Difficulty subtracting simple calculations - -  Recall 3 2 3   Language- name 2 objects 2 2 2   Language- repeat 1 1 1   Language- follow 3 step command 3 3 3   Language- read & follow direction 1 1 1   Write a sentence 1 1 1   Copy design 1 1 1   Total score 29 29 30      6CIT Screen 08/02/2018  What Year? 0 points  What month? 0 points  What time? 0 points  Count back from 20 0 points  Months in reverse 0 points  Repeat phrase 0 points  Total Score 0    Immunization History  Administered Date(s) Administered  . Influenza, High Dose Seasonal PF 07/28/2016, 07/30/2017, 08/02/2018  .  Influenza-Unspecified 08/08/2014, 07/13/2015  . Pneumococcal Conjugate-13 07/27/2015  . Pneumococcal Polysaccharide-23 12/15/2016   Screening Tests Health Maintenance  Topic Date Due  . OPHTHALMOLOGY EXAM  04/23/1951  . TETANUS/TDAP  04/22/1960  . MAMMOGRAM  07/27/2018  . HEMOGLOBIN A1C  11/03/2018  . FOOT EXAM  05/05/2019  . INFLUENZA VACCINE  Completed  . DEXA SCAN  Completed  . PNA vac Low Risk Adult  Completed      Plan:    End of life planning; Advance aging; Advanced directives discussed. Copy of current HCPOA/Living Will on file.    I have personally reviewed and noted the following in the patient's chart:   . Medical and social history . Use of alcohol, tobacco or illicit drugs  . Current medications and supplements . Functional ability and status . Nutritional status . Physical activity . Advanced directives . List of other physicians . Hospitalizations, surgeries, and ER visits in previous 12 months . Vitals . Screenings to include cognitive, depression, and falls . Referrals and appointments  In addition, I have reviewed and discussed with patient certain preventive protocols, quality metrics, and best practice recommendations. A written personalized care plan for preventive services as well as general preventive health recommendations were provided to patient.     Varney Biles, LPN  [id number]  Agree with plan. Mable Paris, NP

## 2018-08-02 NOTE — Patient Instructions (Addendum)
  Ms. Sosinski , Thank you for taking time to come for your Medicare Wellness Visit. I appreciate your ongoing commitment to your health goals. Please review the following plan we discussed and let me know if I can assist you in the future.   These are the goals we discussed: Goals      Patient Stated   . Lose Weight (pt-stated)     Healthy low carb diet Stay active  Stay hydrated       This is a list of the screening recommended for you and due dates:  Health Maintenance  Topic Date Due  . Eye exam for diabetics  04/23/1951  . Tetanus Vaccine  04/22/1960  . Mammogram  07/27/2018  . Hemoglobin A1C  11/03/2018  . Complete foot exam   05/05/2019  . Flu Shot  Completed  . DEXA scan (bone density measurement)  Completed  . Pneumonia vaccines  Completed

## 2018-09-06 ENCOUNTER — Other Ambulatory Visit: Payer: Self-pay | Admitting: Internal Medicine

## 2018-09-06 DIAGNOSIS — Z1231 Encounter for screening mammogram for malignant neoplasm of breast: Secondary | ICD-10-CM

## 2018-09-07 ENCOUNTER — Encounter: Payer: Self-pay | Admitting: Internal Medicine

## 2018-09-07 ENCOUNTER — Ambulatory Visit (INDEPENDENT_AMBULATORY_CARE_PROVIDER_SITE_OTHER): Payer: Medicare Other | Admitting: Internal Medicine

## 2018-09-07 VITALS — BP 144/72 | HR 57 | Temp 97.9°F | Resp 16 | Wt 180.0 lb

## 2018-09-07 DIAGNOSIS — E119 Type 2 diabetes mellitus without complications: Secondary | ICD-10-CM | POA: Diagnosis not present

## 2018-09-07 DIAGNOSIS — Z0000 Encounter for general adult medical examination without abnormal findings: Secondary | ICD-10-CM | POA: Diagnosis not present

## 2018-09-07 DIAGNOSIS — I10 Essential (primary) hypertension: Secondary | ICD-10-CM

## 2018-09-07 DIAGNOSIS — E7800 Pure hypercholesterolemia, unspecified: Secondary | ICD-10-CM | POA: Diagnosis not present

## 2018-09-07 NOTE — Progress Notes (Signed)
[name], female   DOB: [birth year], 77 y.o.   MRN: [id number]   [name], female    DOB: August 26, 1941, 77 y.o.   MRN: [id number]  HPI  Patient her for a physical exam.  States she is doing well.  Feels good.  Stays active. No chest pain.  No sob. No acid reflux. No abdominal pain.  Bowels moving.  Up to date with colonoscopy.  Has mammogram scheduled in 09/2018.  She has adjusted her diet.  Lost weight.     Past Medical History:  Diagnosis Date  . Essential hypertension   . History of chicken pox   . Hypercholesterolemia    Past Surgical History:  Procedure Laterality Date  . cataract surgery     bilateral  . COLONOSCOPY WITH PROPOFOL N/A 12/03/2016   Procedure: COLONOSCOPY WITH PROPOFOL;  Surgeon: Manya Silvas, MD;  Location: [facility] ENDOSCOPY;  Service: Endoscopy;  Laterality: N/A;   Family History  Problem Relation Age of Onset  . Hypertension Mother   . Congestive Heart Failure Mother   . Colon cancer Brother    Social History   Socioeconomic History  . Marital status: Single    Spouse name: Not on file  . Number of children: Not on file  . Years of education: Not on file  . Highest education level: Not on file  Occupational History  . Not on file  Social Needs  . Financial resource strain: Not hard at all  . Food insecurity:    Worry: Never true    Inability: Never true  . Transportation needs:    Medical: No    Non-medical: No  Tobacco Use  . Smoking status: Never Smoker  . Smokeless tobacco: Never Used  Substance and Sexual Activity  . Alcohol use: No    Alcohol/week: 0.0 standard drinks  . Drug use: No  . Sexual activity: Never  Lifestyle  . Physical activity:    Days per week: 3 days    Minutes per session: 60 min  . Stress: Not at all  Relationships  . Social connections:    Talks on phone: Not on file    Gets together: Not on file    Attends religious service: Not on file    Active member of  club or organization: Not on file    Attends meetings of clubs or organizations: Not on file    Relationship status: Not on file  Other Topics Concern  . Not on file  Social History Narrative  . Not on file    Outpatient Encounter Medications as of 09/07/2018  Medication Sig  . clotrimazole (LOTRIMIN) 1 % external solution Apply 1 application topically 2 (two) times daily. In between toes on left foot (Patient taking differently: Apply 1 application topically 2 (two) times daily as needed. In between toes on left foot)  . losartan-hydrochlorothiazide (HYZAAR) 100-12.5 MG tablet TAKE 1 TABLET BY MOUTH EVERY DAY  . rosuvastatin (CRESTOR) 10 MG tablet Take 1 tablet (10 mg total) by mouth daily.   Facility-Administered Encounter Medications as of 09/07/2018  Medication  . triamcinolone acetonide (KENALOG) 10 MG/ML injection 10 mg    Review of Systems  Constitutional: Negative for appetite change and unexpected weight change.  HENT: Negative for congestion and sinus pressure.   Eyes: Negative for pain and visual disturbance.  Respiratory: Negative for cough, chest tightness and shortness of breath.   Cardiovascular: Negative for chest pain, palpitations and leg  swelling.  Gastrointestinal: Negative for abdominal pain, diarrhea, nausea and vomiting.  Genitourinary: Negative for difficulty urinating and dysuria.  Musculoskeletal: Negative for joint swelling and myalgias.  Skin: Negative for color change and rash.  Neurological: Negative for dizziness, light-headedness and headaches.  Hematological: Negative for adenopathy. Does not bruise/bleed easily.  Psychiatric/Behavioral: Negative for agitation and dysphoric mood.       Objective:    Physical Exam  Constitutional: She is oriented to person, place, and time. She appears well-developed and well-nourished. No distress.  HENT:  Nose: Nose normal.  Mouth/Throat: Oropharynx is clear and moist.  Eyes: Right eye exhibits no  discharge. Left eye exhibits no discharge. No scleral icterus.  Neck: Neck supple. No thyromegaly present.  Cardiovascular: Normal rate and regular rhythm.  Pulmonary/Chest: Breath sounds normal. No accessory muscle usage. No tachypnea. No respiratory distress. She has no decreased breath sounds. She has no wheezes. She has no rhonchi. Right breast exhibits no inverted nipple, no mass, no nipple discharge and no tenderness (no axillary adenopathy). Left breast exhibits no inverted nipple, no mass, no nipple discharge and no tenderness (no axilarry adenopathy).  Abdominal: Soft. Bowel sounds are normal. There is no tenderness.  Musculoskeletal: She exhibits no edema or tenderness.  Lymphadenopathy:    She has no cervical adenopathy.  Neurological: She is alert and oriented to person, place, and time.  Skin: No rash noted. No erythema.  Psychiatric: She has a normal mood and affect. Her behavior is normal.    BP (!) 144/72 (BP Location: Left Arm, Patient Position: Sitting, Cuff Size: Large)   Pulse (!) 57   Temp 97.9 F (36.6 C) (Oral)   Resp 16   Wt 180 lb (81.6 kg)   SpO2 98%   BMI 30.90 kg/m  Wt Readings from Last 3 Encounters:  09/07/18 180 lb (81.6 kg)  08/02/18 182 lb (82.6 kg)  05/04/18 184 lb 6 oz (83.6 kg)     Lab Results  Component Value Date   WBC 3.7 (L) 05/03/2018   HGB 12.4 05/03/2018   HCT 35.9 (L) 05/03/2018   PLT 215.0 05/03/2018   GLUCOSE 97 05/04/2018   CHOL 151 05/03/2018   TRIG 63.0 05/03/2018   HDL 42.50 05/03/2018   LDLCALC 96 05/03/2018   ALT 26 05/03/2018   AST 21 05/03/2018   NA 142 05/04/2018   K 4.1 05/04/2018   CL 109 05/04/2018   CREATININE 1.15 05/04/2018   BUN 29 (H) 05/04/2018   CO2 27 05/04/2018   TSH 2.33 08/21/2017   HGBA1C 6.6 (H) 05/03/2018    Mm Screening Breast Tomo Bilateral  Result Date: 07/27/2017 CLINICAL DATA:  Screening. EXAM: 2D DIGITAL SCREENING BILATERAL MAMMOGRAM WITH CAD AND ADJUNCT TOMO COMPARISON:  Previous  exam(s). ACR Breast Density Category c: The breast tissue is heterogeneously dense, which may obscure small masses. FINDINGS: There are no findings suspicious for malignancy. Images were processed with CAD. IMPRESSION: No mammographic evidence of malignancy. A result letter of this screening mammogram will be mailed directly to the patient. RECOMMENDATION: Screening mammogram in one year. (Code:SM-B-01Y) BI-RADS CATEGORY  1: Negative. Electronically Signed   By: Lajean Manes M.D.   On: 07/27/2017 08:33       Assessment & Plan:   Problem List Items Addressed This Visit    Diabetes mellitus without complication ([place])    Continue low carb diet and exercise.  Follow met b and a1c.  She has adjusted her diet.  Lost weight.  Relevant Orders   Hemoglobin C7G   Basic metabolic panel   Microalbumin / creatinine urine ratio   Health care maintenance    Physical today 09/07/18.  Mammogram 07/27/17 - Birads I.  Scheduled for f/u mammogram in 09/2018.  Colonoscopy 12/03/16 - diverticulosis and internal hemorrhoids.  Bone density 2016 - normal.        Hypercholesterolemia    Low cholesterol diet and exercise.  On crestor and doing well.  Follow lipid panel and liver function tests.        Relevant Orders   Hepatic function panel   Lipid panel   Hypertension, essential    Blood pressure on recheck improved.  Continue current medication regimen.  Follow pressures.  Follow metabolic panel.       Relevant Orders   CBC with Differential/Platelet   TSH    Other Visit Diagnoses    Routine general medical examination at a health care facility    -  Primary       Einar Pheasant, MD

## 2018-09-07 NOTE — Assessment & Plan Note (Addendum)
Low cholesterol diet and exercise.  On crestor and doing well.  Follow lipid panel and liver function tests.

## 2018-09-07 NOTE — Assessment & Plan Note (Signed)
Physical today 09/07/18.  Mammogram 07/27/17 - Birads I.  Scheduled for f/u mammogram in 09/2018.  Colonoscopy 12/03/16 - diverticulosis and internal hemorrhoids.  Bone density 2016 - normal.

## 2018-09-07 NOTE — Assessment & Plan Note (Signed)
Continue low carb diet and exercise.  Follow met b and a1c.  She has adjusted her diet.  Lost weight.

## 2018-09-12 ENCOUNTER — Encounter: Payer: Self-pay | Admitting: Internal Medicine

## 2018-09-12 NOTE — Assessment & Plan Note (Signed)
Blood pressure on recheck improved.  Continue current medication regimen.  Follow pressures.  Follow metabolic panel.   

## 2018-09-14 DIAGNOSIS — H524 Presbyopia: Secondary | ICD-10-CM | POA: Diagnosis not present

## 2018-09-14 DIAGNOSIS — H40023 Open angle with borderline findings, high risk, bilateral: Secondary | ICD-10-CM | POA: Diagnosis not present

## 2018-09-23 ENCOUNTER — Other Ambulatory Visit: Payer: Self-pay | Admitting: Internal Medicine

## 2018-10-04 ENCOUNTER — Ambulatory Visit
Admission: RE | Admit: 2018-10-04 | Discharge: 2018-10-04 | Disposition: A | Discharge status: Home / Self Care | Payer: Medicare Other | Source: Ambulatory Visit | Attending: Internal Medicine | Admitting: Internal Medicine

## 2018-10-04 DIAGNOSIS — Z1231 Encounter for screening mammogram for malignant neoplasm of breast: Secondary | ICD-10-CM | POA: Insufficient documentation

## 2018-10-15 ENCOUNTER — Other Ambulatory Visit (INDEPENDENT_AMBULATORY_CARE_PROVIDER_SITE_OTHER): Payer: Medicare Other

## 2018-10-15 DIAGNOSIS — E119 Type 2 diabetes mellitus without complications: Secondary | ICD-10-CM | POA: Diagnosis not present

## 2018-10-15 DIAGNOSIS — E7800 Pure hypercholesterolemia, unspecified: Secondary | ICD-10-CM | POA: Diagnosis not present

## 2018-10-15 DIAGNOSIS — I10 Essential (primary) hypertension: Secondary | ICD-10-CM

## 2018-10-15 LAB — TSH: TSH: 2.32 u[IU]/mL (ref 0.35–4.50)

## 2018-10-15 LAB — LIPID PANEL
Cholesterol: 133 mg/dL (ref 0–200)
HDL: 40.7 mg/dL (ref >39.00)
LDL Cholesterol: 78 mg/dL (ref 0–99)
NonHDL: 92.43
TRIGLYCERIDES: 71 mg/dL (ref 0.0–149.0)
Total CHOL/HDL Ratio: 3
VLDL: 14.2 mg/dL (ref 0.0–40.0)

## 2018-10-15 LAB — CBC WITH DIFFERENTIAL/PLATELET
Basophils Absolute: 0 10*3/uL (ref 0.0–0.1)
Basophils Relative: 0.4 % (ref 0.0–3.0)
Eosinophils Absolute: 0.1 10*3/uL (ref 0.0–0.7)
Eosinophils Relative: 3.5 % (ref 0.0–5.0)
HCT: 37.9 % (ref 36.0–46.0)
Hemoglobin: 12.8 g/dL (ref 12.0–15.0)
Lymphocytes Relative: 33.7 % (ref 12.0–46.0)
Lymphs Abs: 1.2 10*3/uL (ref 0.7–4.0)
MCHC: 33.9 g/dL (ref 30.0–36.0)
MCV: 91.8 fl (ref 78.0–100.0)
Monocytes Absolute: 0.2 10*3/uL (ref 0.1–1.0)
Monocytes Relative: 6.3 % (ref 3.0–12.0)
Neutro Abs: 2 10*3/uL (ref 1.4–7.7)
Neutrophils Relative %: 56.1 % (ref 43.0–77.0)
Platelets: 243 10*3/uL (ref 150.0–400.0)
RBC: 4.13 Mil/uL (ref 3.87–5.11)
RDW: 14.9 % (ref 11.5–15.5)
WBC: 3.6 10*3/uL — ABNORMAL LOW (ref 4.0–10.5)

## 2018-10-15 LAB — HEPATIC FUNCTION PANEL
ALBUMIN: 4.2 g/dL (ref 3.5–5.2)
ALK PHOS: 54 U/L (ref 39–117)
ALT: 26 U/L (ref 0–35)
AST: 21 U/L (ref 0–37)
BILIRUBIN DIRECT: 0.1 mg/dL (ref 0.0–0.3)
Total Bilirubin: 0.4 mg/dL (ref 0.2–1.2)
Total Protein: 6.7 g/dL (ref 6.0–8.3)

## 2018-10-15 LAB — MICROALBUMIN / CREATININE URINE RATIO
Creatinine,U: 63.3 mg/dL
MICROALB/CREAT RATIO: 3 mg/g (ref 0.0–30.0)
Microalb, Ur: 1.9 mg/dL (ref 0.0–1.9)

## 2018-10-15 LAB — HEMOGLOBIN A1C: Hgb A1c MFr Bld: 6.5 % (ref 4.6–6.5)

## 2018-10-15 LAB — BASIC METABOLIC PANEL
BUN: 33 mg/dL — ABNORMAL HIGH (ref 6–23)
CHLORIDE: 110 meq/L (ref 96–112)
CO2: 27 meq/L (ref 19–32)
CREATININE: 1.25 mg/dL — AB (ref 0.40–1.20)
Calcium: 10 mg/dL (ref 8.4–10.5)
GFR: 53.38 mL/min — ABNORMAL LOW (ref >60.00)
Glucose, Bld: 101 mg/dL — ABNORMAL HIGH (ref 70–99)
POTASSIUM: 4.1 meq/L (ref 3.5–5.1)
Sodium: 143 mEq/L (ref 135–145)

## 2018-10-18 ENCOUNTER — Other Ambulatory Visit: Payer: Self-pay | Admitting: Internal Medicine

## 2018-10-18 DIAGNOSIS — R944 Abnormal results of kidney function studies: Secondary | ICD-10-CM

## 2018-10-18 NOTE — Progress Notes (Signed)
Order placed for f/u met b.  

## 2018-11-04 ENCOUNTER — Other Ambulatory Visit: Payer: Self-pay | Admitting: Internal Medicine

## 2018-12-13 ENCOUNTER — Other Ambulatory Visit (INDEPENDENT_AMBULATORY_CARE_PROVIDER_SITE_OTHER): Payer: Medicare Other

## 2018-12-13 DIAGNOSIS — R944 Abnormal results of kidney function studies: Secondary | ICD-10-CM | POA: Diagnosis not present

## 2018-12-13 LAB — BASIC METABOLIC PANEL
BUN: 35 mg/dL — ABNORMAL HIGH (ref 6–23)
CO2: 24 mEq/L (ref 19–32)
CREATININE: 1.16 mg/dL (ref 0.40–1.20)
Calcium: 9.9 mg/dL (ref 8.4–10.5)
Chloride: 110 mEq/L (ref 96–112)
GFR: 54.72 mL/min — ABNORMAL LOW (ref >60.00)
Glucose, Bld: 97 mg/dL (ref 70–99)
Potassium: 3.7 mEq/L (ref 3.5–5.1)
Sodium: 142 mEq/L (ref 135–145)

## 2018-12-29 ENCOUNTER — Telehealth: Payer: Self-pay | Admitting: Internal Medicine

## 2018-12-29 NOTE — Telephone Encounter (Signed)
Patient returning call to Trisha. Please advise. 

## 2018-12-29 NOTE — Telephone Encounter (Signed)
Pt is requesting a refill on her lisinopril-hydrochlorothiazide (PRINZIDE,ZESTORETIC) 20-12.5 MG tablet, pt has been out of this medication since Monday. Please send to [facility] on [address].

## 2018-12-29 NOTE — Telephone Encounter (Signed)
LMTCB

## 2018-12-29 NOTE — Telephone Encounter (Signed)
LMTCB. Need to clarify. This rx has not been filled since 2016

## 2018-12-30 ENCOUNTER — Other Ambulatory Visit: Payer: Self-pay

## 2018-12-30 MED ORDER — LISINOPRIL-HYDROCHLOROTHIAZIDE 20-12.5 MG PO TABS: ORAL_TABLET | ORAL | 1 refills | Status: DC

## 2018-12-30 NOTE — Telephone Encounter (Signed)
Spoke with pt. Confirmed that she is taking this medication once daily. Was last filled at [facility] 09/27/18 #90. Sent in 90 day to [facility] as requested

## 2019-02-02 ENCOUNTER — Other Ambulatory Visit: Payer: Self-pay | Admitting: Internal Medicine

## 2019-03-09 ENCOUNTER — Other Ambulatory Visit: Payer: Self-pay

## 2019-03-09 ENCOUNTER — Encounter: Payer: Self-pay | Admitting: Internal Medicine

## 2019-03-09 ENCOUNTER — Ambulatory Visit (INDEPENDENT_AMBULATORY_CARE_PROVIDER_SITE_OTHER): Payer: Medicare Other | Admitting: Internal Medicine

## 2019-03-09 DIAGNOSIS — E119 Type 2 diabetes mellitus without complications: Secondary | ICD-10-CM

## 2019-03-09 DIAGNOSIS — D72819 Decreased white blood cell count, unspecified: Secondary | ICD-10-CM

## 2019-03-09 DIAGNOSIS — I10 Essential (primary) hypertension: Secondary | ICD-10-CM

## 2019-03-09 DIAGNOSIS — E7800 Pure hypercholesterolemia, unspecified: Secondary | ICD-10-CM | POA: Diagnosis not present

## 2019-03-09 NOTE — Assessment & Plan Note (Signed)
Blood pressure as outlined.  Continue same medication.  Follow pressures.  Follow metabolic panel.

## 2019-03-09 NOTE — Assessment & Plan Note (Signed)
Discussed low carb diet and exercise.  Follow met b and a1c.  She is trying to stay active her monitor her diet.

## 2019-03-09 NOTE — Assessment & Plan Note (Signed)
On crestor.  Low cholesterol diet and exercise.  Follow lipid panel and liver function tests.   

## 2019-03-09 NOTE — Progress Notes (Signed)
[name], female   DOB: 09/09/1941, 78 y.o.   MRN: [id number]   Virtual Visit via Telephone Note  This visit type was conducted due to national recommendations for restrictions regarding the COVID-19 pandemic (e.g. social distancing).  This format is felt to be most appropriate for this patient at this time.  All issues noted in this document were discussed and addressed.  No physical exam was performed (except for noted visual exam findings with Video Visits).   I connected with Sheri Cook by telephone and verified that I am speaking with the correct person using two identifiers. Location patient: home Location provider: work Persons participating in the telephone visit: patient, provider  I discussed the limitations, risks, security and privacy concerns of performing an evaluation and management service by telephone and the availability of in person appointments. The patient expressed understanding and agreed to proceed.   Reason for visit: scheduled follow up.   HPI: She reports she is doing well.  Feels good.  No chest pain.  No sob.  No acid reflux.  No abdominal pain.  Bowels moving.  Her gym closed, so she is trying to stay active around her house.  Trying to stay in due to COVID restrictions.  No fever.  No chest congestion.  No sob.  Blood pressures varies - 130-140s/70s.  Will continue to monitor.     ROS: See pertinent positives and negatives per HPI.  Past Medical History:  Diagnosis Date  . Essential hypertension   . History of chicken pox   . Hypercholesterolemia     Past Surgical History:  Procedure Laterality Date  . cataract surgery     bilateral  . COLONOSCOPY WITH PROPOFOL N/A 12/03/2016   Procedure: COLONOSCOPY WITH PROPOFOL;  Surgeon: Manya Silvas, MD;  Location: [facility] ENDOSCOPY;  Service: Endoscopy;  Laterality: N/A;    Family History  Problem Relation Age of Onset  . Hypertension Mother   . Congestive Heart Failure Mother   .  Colon cancer Brother     SOCIAL HX: reviewed.    Current Outpatient Medications:  .  clotrimazole (LOTRIMIN) 1 % external solution, Apply 1 application topically 2 (two) times daily. In between toes on left foot (Patient taking differently: Apply 1 application topically 2 (two) times daily as needed. In between toes on left foot), Disp: 30 mL, Rfl: 4 .  lisinopril-hydrochlorothiazide (PRINZIDE,ZESTORETIC) 20-12.5 MG tablet, Take 1 tablet by mouth once daily., Disp: 90 tablet, Rfl: 1 .  rosuvastatin (CRESTOR) 10 MG tablet, Take 1 tablet by mouth once daily, Disp: 90 tablet, Rfl: 1  Current Facility-Administered Medications:  .  triamcinolone acetonide (KENALOG) 10 MG/ML injection 10 mg, 10 mg, Other, Once, Stover, Oblong, DPM  EXAM:  GENERAL: alert.  Sounds to be in no acute distress.  Answering questions appropriately.   PSYCH/NEURO: pleasant and cooperative, no obvious depression or anxiety, speech and thought processing grossly intact  ASSESSMENT AND PLAN:  Discussed the following assessment and plan:  Leukopenia, unspecified type - Plan: CBC with Differential/Platelet  Diabetes mellitus without complication ([place]) - Plan: Hemoglobin A1c  Hypercholesterolemia - Plan: Hepatic function panel, Lipid panel  Hypertension, essential - Plan: Basic metabolic panel  Diabetes mellitus without complication ([place]) Discussed low carb diet and exercise.  Follow met b and a1c.  She is trying to stay active her monitor her diet.    Hypercholesterolemia On crestor.  Low cholesterol diet and exercise.  Follow lipid panel and liver function tests.    Hypertension,  essential Blood pressure as outlined.  Continue same medication.  Follow pressures.  Follow metabolic panel.     I discussed the assessment and treatment plan with the patient. The patient was provided an opportunity to ask questions and all were answered. The patient agreed with the plan and demonstrated an understanding of the  instructions.   The patient was advised to call back or seek an in-person evaluation if the symptoms worsen or if the condition fails to improve as anticipated.  I provided 12 minutes of non-face-to-face time during this encounter.   Einar Pheasant, MD

## 2019-06-28 ENCOUNTER — Other Ambulatory Visit: Payer: Self-pay | Admitting: Internal Medicine

## 2019-08-01 ENCOUNTER — Other Ambulatory Visit: Payer: Self-pay | Admitting: Internal Medicine

## 2019-08-05 ENCOUNTER — Other Ambulatory Visit (INDEPENDENT_AMBULATORY_CARE_PROVIDER_SITE_OTHER): Payer: Medicare Other

## 2019-08-05 ENCOUNTER — Other Ambulatory Visit: Payer: Self-pay

## 2019-08-05 DIAGNOSIS — D72819 Decreased white blood cell count, unspecified: Secondary | ICD-10-CM | POA: Diagnosis not present

## 2019-08-05 DIAGNOSIS — I10 Essential (primary) hypertension: Secondary | ICD-10-CM

## 2019-08-05 DIAGNOSIS — E7800 Pure hypercholesterolemia, unspecified: Secondary | ICD-10-CM | POA: Diagnosis not present

## 2019-08-05 DIAGNOSIS — E119 Type 2 diabetes mellitus without complications: Secondary | ICD-10-CM

## 2019-08-05 LAB — CBC WITH DIFFERENTIAL/PLATELET
Basophils Absolute: 0 10*3/uL (ref 0.0–0.1)
Basophils Relative: 0.5 % (ref 0.0–3.0)
Eosinophils Absolute: 0.1 10*3/uL (ref 0.0–0.7)
Eosinophils Relative: 3.1 % (ref 0.0–5.0)
HCT: 38.4 % (ref 36.0–46.0)
Hemoglobin: 12.6 g/dL (ref 12.0–15.0)
Lymphocytes Relative: 36.2 % (ref 12.0–46.0)
Lymphs Abs: 1.5 10*3/uL (ref 0.7–4.0)
MCHC: 33 g/dL (ref 30.0–36.0)
MCV: 92.8 fl (ref 78.0–100.0)
Monocytes Absolute: 0.4 10*3/uL (ref 0.1–1.0)
Monocytes Relative: 8.6 % (ref 3.0–12.0)
Neutro Abs: 2.2 10*3/uL (ref 1.4–7.7)
Neutrophils Relative %: 51.6 % (ref 43.0–77.0)
Platelets: 236 10*3/uL (ref 150.0–400.0)
RBC: 4.14 Mil/uL (ref 3.87–5.11)
RDW: 15.1 % (ref 11.5–15.5)
WBC: 4.2 10*3/uL (ref 4.0–10.5)

## 2019-08-05 LAB — BASIC METABOLIC PANEL
BUN: 36 mg/dL — ABNORMAL HIGH (ref 6–23)
CO2: 25 mEq/L (ref 19–32)
Calcium: 10.2 mg/dL (ref 8.4–10.5)
Chloride: 107 mEq/L (ref 96–112)
Creatinine, Ser: 1.14 mg/dL (ref 0.40–1.20)
GFR: 55.74 mL/min — ABNORMAL LOW (ref >60.00)
Glucose, Bld: 103 mg/dL — ABNORMAL HIGH (ref 70–99)
Potassium: 3.9 mEq/L (ref 3.5–5.1)
Sodium: 140 mEq/L (ref 135–145)

## 2019-08-05 LAB — LIPID PANEL
Cholesterol: 147 mg/dL (ref 0–200)
HDL: 44.2 mg/dL (ref >39.00)
LDL Cholesterol: 84 mg/dL (ref 0–99)
NonHDL: 102.47
Total CHOL/HDL Ratio: 3
Triglycerides: 94 mg/dL (ref 0.0–149.0)
VLDL: 18.8 mg/dL (ref 0.0–40.0)

## 2019-08-05 LAB — HEMOGLOBIN A1C: Hgb A1c MFr Bld: 6.7 % — ABNORMAL HIGH (ref 4.6–6.5)

## 2019-08-05 LAB — HEPATIC FUNCTION PANEL
ALT: 28 U/L (ref 0–35)
AST: 22 U/L (ref 0–37)
Albumin: 4.4 g/dL (ref 3.5–5.2)
Alkaline Phosphatase: 58 U/L (ref 39–117)
Bilirubin, Direct: 0.1 mg/dL (ref 0.0–0.3)
Total Bilirubin: 0.4 mg/dL (ref 0.2–1.2)
Total Protein: 7 g/dL (ref 6.0–8.3)

## 2019-08-08 ENCOUNTER — Other Ambulatory Visit: Payer: Self-pay

## 2019-08-08 ENCOUNTER — Ambulatory Visit (INDEPENDENT_AMBULATORY_CARE_PROVIDER_SITE_OTHER): Payer: Medicare Other | Admitting: Internal Medicine

## 2019-08-08 ENCOUNTER — Ambulatory Visit (INDEPENDENT_AMBULATORY_CARE_PROVIDER_SITE_OTHER): Payer: Medicare Other

## 2019-08-08 ENCOUNTER — Encounter: Payer: Self-pay | Admitting: Internal Medicine

## 2019-08-08 VITALS — BP 138/70 | HR 64 | Temp 97.4°F | Resp 16 | Ht 63.0 in | Wt 180.8 lb

## 2019-08-08 DIAGNOSIS — Z1231 Encounter for screening mammogram for malignant neoplasm of breast: Secondary | ICD-10-CM

## 2019-08-08 DIAGNOSIS — Z0000 Encounter for general adult medical examination without abnormal findings: Secondary | ICD-10-CM

## 2019-08-08 DIAGNOSIS — E119 Type 2 diabetes mellitus without complications: Secondary | ICD-10-CM | POA: Diagnosis not present

## 2019-08-08 DIAGNOSIS — Z23 Encounter for immunization: Secondary | ICD-10-CM | POA: Diagnosis not present

## 2019-08-08 DIAGNOSIS — Z800 Family history of malignant neoplasm of digestive organs: Secondary | ICD-10-CM

## 2019-08-08 DIAGNOSIS — I10 Essential (primary) hypertension: Secondary | ICD-10-CM

## 2019-08-08 DIAGNOSIS — E7800 Pure hypercholesterolemia, unspecified: Secondary | ICD-10-CM

## 2019-08-08 LAB — HM DIABETES FOOT EXAM

## 2019-08-08 NOTE — Progress Notes (Addendum)
Subjective:   Sheri Cook is a 78 y.o. female who presents for Medicare Annual (Subsequent) preventive examination.  Review of Systems:  No ROS.  Medicare Wellness Virtual Visit.  Visual/audio telehealth visit, UTA vital signs.   See social history for additional risk factors.   Cardiac Risk Factors include: advanced age (>47men, >60 women);diabetes mellitus;hypertension     Objective:     Vitals: There were no vitals taken for this visit.  There is no height or weight on file to calculate BMI.  Advanced Directives 08/08/2019 08/02/2018 07/30/2017 07/28/2016 07/27/2015  Does Patient Have a Medical Advance Directive? Yes Yes Yes Yes Yes  Type of Paramedic of [place];Living will Living will;Healthcare Power of [place];Living will [place];Living will [place]  Does patient want to make changes to medical advance directive? No - Patient declined No - Patient declined No - Patient declined No - Patient declined No - Patient declined  Copy of [place] in Chart? Yes - validated most recent copy scanned in chart (See row information) Yes Yes Yes No - copy requested    Tobacco Social History   Tobacco Use  Smoking Status Never Smoker  Smokeless Tobacco Never Used     Counseling given: Not Answered   Clinical Intake:  Pre-visit preparation completed: Yes        Diabetes: Yes(Followed by pcp)  How often do you need to have someone help you when you read instructions, pamphlets, or other written materials from your doctor or pharmacy?: 1 - Never  Interpreter Needed?: No     Past Medical History:  Diagnosis Date  . Essential hypertension   . History of chicken pox   . Hypercholesterolemia    Past Surgical History:  Procedure Laterality Date  . cataract surgery     bilateral  . COLONOSCOPY WITH PROPOFOL N/A 12/03/2016   Procedure: COLONOSCOPY WITH  PROPOFOL;  Surgeon: Manya Silvas, MD;  Location: [facility] ENDOSCOPY;  Service: Endoscopy;  Laterality: N/A;   Family History  Problem Relation Age of Onset  . Hypertension Mother   . Congestive Heart Failure Mother   . Colon cancer Brother    Social History   Socioeconomic History  . Marital status: Single    Spouse name: Not on file  . Number of children: Not on file  . Years of education: Not on file  . Highest education level: Not on file  Occupational History  . Not on file  Social Needs  . Financial resource strain: Not hard at all  . Food insecurity    Worry: Never true    Inability: Never true  . Transportation needs    Medical: No    Non-medical: No  Tobacco Use  . Smoking status: Never Smoker  . Smokeless tobacco: Never Used  Substance and Sexual Activity  . Alcohol use: No    Alcohol/week: 0.0 standard drinks  . Drug use: No  . Sexual activity: Never  Lifestyle  . Physical activity    Days per week: 3 days    Minutes per session: 60 min  . Stress: Not at all  Relationships  . Social Herbalist on phone: Not on file    Gets together: Not on file    Attends religious service: Not on file    Active member of club or organization: Not on file    Attends meetings of clubs or organizations: Not on  file    Relationship status: Not on file  Other Topics Concern  . Not on file  Social History Narrative  . Not on file    Outpatient Encounter Medications as of 08/08/2019  Medication Sig  . lisinopril-hydrochlorothiazide (ZESTORETIC) 20-12.5 MG tablet Take 1 tablet by mouth once daily  . rosuvastatin (CRESTOR) 10 MG tablet Take 1 tablet by mouth once daily  . clotrimazole (LOTRIMIN) 1 % external solution Apply 1 application topically 2 (two) times daily. In between toes on left foot (Patient not taking: Reported on 08/08/2019)   Facility-Administered Encounter Medications as of 08/08/2019  Medication  . triamcinolone acetonide (KENALOG) 10 MG/ML  injection 10 mg    Activities of Daily Living In your present state of health, do you have any difficulty performing the following activities: 08/08/2019  Hearing? N  Vision? N  Difficulty concentrating or making decisions? N  Walking or climbing stairs? N  Dressing or bathing? N  Doing errands, shopping? N  Preparing Food and eating ? N  Using the Toilet? N  In the past six months, have you accidently leaked urine? N  Do you have problems with loss of bowel control? N  Managing your Medications? N  Managing your Finances? N  Housekeeping or managing your Housekeeping? N  Some recent data might be hidden    Patient Care Team: Einar Pheasant, MD as PCP - General (Internal Medicine)    Assessment:   This is a routine wellness examination for [name].  I connected with patient 08/08/19 at  9:30 AM EDT by an audio enabled telemedicine application and verified that I am speaking with the correct person using two identifiers. Patient stated full name and DOB. Patient gave permission to continue with virtual visit. Patient's location was at home and Nurse's location was at [place] office.   Health Maintenance Due: -Influenza vaccine 2020- discussed; to be completed in season with doctor or local pharmacy.   -Tdap- discussed; to be completed with doctor in visit or local pharmacy.   -Hgb A1c- 08/05/19 (6.7) -Foot exam- followed by pcp Update all pending maintenance due as appropriate.   See completed HM at the end of note.   Eye: Visual acuity not assessed. Virtual visit. Wears corrective lenses. Followed by their ophthalmologist every 6 months. Glaucoma suspect; no drops in use. Retinopathy- none reported  Dental: Visits every 6 months.   Dentures- yes  Hearing: Demonstrates normal hearing during visit.  Safety:  Patient feels safe at home- yes Patient does have smoke detectors at home- yes Patient does wear sunscreen or protective clothing when in direct sunlight - yes  Patient does wear seat belt when in a moving vehicle - yes Patient drives- yes Adequate lighting in walkways free from debris- yes Grab bars and handrails used as appropriate- yes Ambulates with no assistive device Cell phone or lifeline/life alert/medic alert on person when ambulating outside of the home- yes  Social: Alcohol intake - no       Smoking history- never   Smokers in home? none Illicit drug use? none  Depression: PHQ 2 &9 complete. See screening below. Denies irritability, anhedonia, sadness/tearfullness.  Stable.   Falls: See screening below.    Medication: Taking as directed and without issues.   Covid-19: Precautions and sickness symptoms discussed. Wears mask, social distancing, hand hygiene as appropriate.   Activities of Daily Living Patient denies needing assistance with: household chores, feeding themselves, getting from bed to chair, getting to the toilet, bathing/showering, dressing, managing money,  or preparing meals.   Memory: Patient is alert. Patient denies difficulty focusing or concentrating. Correctly identified the president of the [place], season and recall. Patient likes to adult coloring and complete puzzles for brain stimulation.  BMI- discussed the importance of a healthy diet, water intake and the benefits of aerobic exercise.  Educational material provided.  Physical activity- walking, no routine   Diet: low carb Water: good intake  Other Providers Patient Care Team: Einar Pheasant, MD as PCP - General (Internal Medicine)  Exercise Activities and Dietary recommendations Current Exercise Habits: Home exercise routine, Type of exercise: walking, Intensity: Mild  Goals      Patient Stated   . Increase physical activity (pt-stated)     I want to walk more for exercise       Fall Risk Fall Risk  08/08/2019 08/02/2018 07/30/2017 08/11/2016 07/28/2016  Falls in the past year? 0 No No No No  Number falls in past yr: - - - - -   Injury with Fall? - - - - -  Follow up - - - - -   Timed Get Up and Go performed: no, virtual visits  Depression Screen PHQ 2/9 Scores 08/08/2019 03/09/2019 08/02/2018 07/30/2017  PHQ - 2 Score 0 0 0 0  PHQ- 9 Score - - - 0     Cognitive Function MMSE - Mini Mental State Exam 07/30/2017 07/28/2016 07/27/2015  Orientation to time 5 5 5   Orientation to Place 5 5 5   Registration 3 3 3   Attention/ Calculation 4 5 5   Attention/Calculation-comments Difficulty subtracting simple calculations - -  Recall 3 2 3   Language- name 2 objects 2 2 2   Language- repeat 1 1 1   Language- follow 3 step command 3 3 3   Language- read & follow direction 1 1 1   Write a sentence 1 1 1   Copy design 1 1 1   Total score 29 29 30      6CIT Screen 08/08/2019 08/02/2018  What Year? 0 points 0 points  What month? 0 points 0 points  What time? 0 points 0 points  Count back from 20 0 points 0 points  Months in reverse 0 points 0 points  Repeat phrase - 0 points  Total Score - 0    Immunization History  Administered Date(s) Administered  . Influenza, High Dose Seasonal PF 07/28/2016, 07/30/2017, 08/02/2018  . Influenza-Unspecified 08/08/2014, 07/13/2015  . Pneumococcal Conjugate-13 07/27/2015  . Pneumococcal Polysaccharide-23 12/15/2016   Screening Tests Health Maintenance  Topic Date Due  . TETANUS/TDAP  04/22/1960  . FOOT EXAM  05/05/2019  . INFLUENZA VACCINE  01/25/2020 (Originally 05/28/2019)  . MAMMOGRAM  10/05/2019  . HEMOGLOBIN A1C  02/03/2020  . OPHTHALMOLOGY EXAM  03/10/2020  . DEXA SCAN  Completed  . PNA vac Low Risk Adult  Completed      Plan:   Keep all routine maintenance appointments.   Follow up today at 10:00 with your doctor  Medicare Attestation I have personally reviewed: The patient's medical and social history Their use of alcohol, tobacco or illicit drugs Their current medications and supplements The patient's functional ability including ADLs,fall risks, home safety  risks, cognitive, and hearing and visual impairment Diet and physical activities Evidence for depression   In addition, I have reviewed and discussed with patient certain preventive protocols, quality metrics, and best practice recommendations. A written personalized care plan for preventive services as well as general preventive health recommendations were provided to patient via mail.     Arby Barrette,  Lesette Frary L, LPN  [id number]   Reviewed above information.  Agree with assessment and plan.    Dr Nicki Reaper

## 2019-08-08 NOTE — Assessment & Plan Note (Signed)
Colonoscopy 11/2016.  

## 2019-08-08 NOTE — Progress Notes (Signed)
[name], female   DOB: August 01, 1941, 78 y.o.   MRN: [id number]   [name], female    DOB: Nov 14, 1940, 78 y.o.   MRN: [id number]  HPI  Patient here for a scheduled follow up.  She reports feeling good.  Still working.  Staying active.  Has not been going to her gym.  Closed for covid.  Discussed exercise around her house.  No chest pain.  No sob.  No acid reflux.  No abdominal pain.  Bowels moving.  Discussed the need for shingrix.  Discussed recent labs.  a1c 6.7.  Taking her cholesterol medication regularly.  LDL 84.     Past Medical History:  Diagnosis Date  . Essential hypertension   . History of chicken pox   . Hypercholesterolemia    Past Surgical History:  Procedure Laterality Date  . cataract surgery     bilateral  . COLONOSCOPY WITH PROPOFOL N/A 12/03/2016   Procedure: COLONOSCOPY WITH PROPOFOL;  Surgeon: Manya Silvas, MD;  Location: [facility] ENDOSCOPY;  Service: Endoscopy;  Laterality: N/A;   Family History  Problem Relation Age of Onset  . Hypertension Mother   . Congestive Heart Failure Mother   . Colon cancer Brother    Social History   Socioeconomic History  . Marital status: Single    Spouse name: Not on file  . Number of children: Not on file  . Years of education: Not on file  . Highest education level: Not on file  Occupational History  . Not on file  Social Needs  . Financial resource strain: Not hard at all  . Food insecurity    Worry: Never true    Inability: Never true  . Transportation needs    Medical: No    Non-medical: No  Tobacco Use  . Smoking status: Never Smoker  . Smokeless tobacco: Never Used  Substance and Sexual Activity  . Alcohol use: No    Alcohol/week: 0.0 standard drinks  . Drug use: No  . Sexual activity: Never  Lifestyle  . Physical activity    Days per week: 3 days    Minutes per session: 60 min  . Stress: Not at all  Relationships  . Social Herbalist on  phone: Not on file    Gets together: Not on file    Attends religious service: Not on file    Active member of club or organization: Not on file    Attends meetings of clubs or organizations: Not on file    Relationship status: Not on file  Other Topics Concern  . Not on file  Social History Narrative  . Not on file    Outpatient Encounter Medications as of 08/08/2019  Medication Sig  . lisinopril-hydrochlorothiazide (ZESTORETIC) 20-12.5 MG tablet Take 1 tablet by mouth once daily  . rosuvastatin (CRESTOR) 10 MG tablet Take 1 tablet by mouth once daily  . [DISCONTINUED] clotrimazole (LOTRIMIN) 1 % external solution Apply 1 application topically 2 (two) times daily. In between toes on left foot (Patient not taking: Reported on 08/08/2019)   Facility-Administered Encounter Medications as of 08/08/2019  Medication  . triamcinolone acetonide (KENALOG) 10 MG/ML injection 10 mg    Review of Systems  Constitutional: Negative for appetite change and unexpected weight change.  HENT: Negative for congestion and sinus pressure.   Respiratory: Negative for cough, chest tightness and shortness of breath.   Cardiovascular: Negative for chest pain, palpitations and leg  swelling.  Gastrointestinal: Negative for abdominal pain, diarrhea, nausea and vomiting.  Genitourinary: Negative for difficulty urinating and dysuria.  Musculoskeletal: Negative for joint swelling and myalgias.  Skin: Negative for color change and rash.  Neurological: Negative for dizziness, light-headedness and headaches.  Psychiatric/Behavioral: Negative for agitation and dysphoric mood.       Objective:    Physical Exam Constitutional:      General: She is not in acute distress.    Appearance: Normal appearance.  HENT:     Head: Normocephalic and atraumatic.     Right Ear: External ear normal.     Left Ear: External ear normal.  Eyes:     General: No scleral icterus.       Right eye: No discharge.        Left  eye: No discharge.     Conjunctiva/sclera: Conjunctivae normal.  Neck:     Musculoskeletal: Neck supple. No muscular tenderness.     Thyroid: No thyromegaly.  Cardiovascular:     Rate and Rhythm: Normal rate and regular rhythm.  Pulmonary:     Effort: No respiratory distress.     Breath sounds: Normal breath sounds. No wheezing.  Abdominal:     General: Bowel sounds are normal.     Palpations: Abdomen is soft.     Tenderness: There is no abdominal tenderness.  Musculoskeletal:        General: No swelling or tenderness.  Lymphadenopathy:     Cervical: No cervical adenopathy.  Skin:    Findings: No erythema or rash.  Neurological:     Mental Status: She is alert.  Psychiatric:        Behavior: Behavior normal.     BP 138/70   Pulse 64   Temp (!) 97.4 F (36.3 C)   Resp 16   Ht 5' 3"  (1.6 m)   Wt 180 lb 12.8 oz (82 kg)   SpO2 98%   BMI 32.03 kg/m  Wt Readings from Last 3 Encounters:  08/08/19 180 lb 12.8 oz (82 kg)  09/07/18 180 lb (81.6 kg)  08/02/18 182 lb (82.6 kg)     Lab Results  Component Value Date   WBC 4.2 08/05/2019   HGB 12.6 08/05/2019   HCT 38.4 08/05/2019   PLT 236.0 08/05/2019   GLUCOSE 103 (H) 08/05/2019   CHOL 147 08/05/2019   TRIG 94.0 08/05/2019   HDL 44.20 08/05/2019   LDLCALC 84 08/05/2019   ALT 28 08/05/2019   AST 22 08/05/2019   NA 140 08/05/2019   K 3.9 08/05/2019   CL 107 08/05/2019   CREATININE 1.14 08/05/2019   BUN 36 (H) 08/05/2019   CO2 25 08/05/2019   TSH 2.32 10/15/2018   HGBA1C 6.7 (H) 08/05/2019   MICROALBUR 1.9 10/15/2018    Mm 3d Screen Breast Bilateral  Result Date: 10/04/2018 CLINICAL DATA:  Screening. EXAM: DIGITAL SCREENING BILATERAL MAMMOGRAM WITH TOMO AND CAD COMPARISON:  Previous exam(s). ACR Breast Density Category c: The breast tissue is heterogeneously dense, which may obscure small masses. FINDINGS: There are no findings suspicious for malignancy. Images were processed with CAD. IMPRESSION: No  mammographic evidence of malignancy. A result letter of this screening mammogram will be mailed directly to the patient. RECOMMENDATION: Screening mammogram in one year. (Code:SM-B-01Y) BI-RADS CATEGORY  1: Negative. Electronically Signed   By: Franki Cabot M.D.   On: 10/04/2018 13:50       Assessment & Plan:   Problem List Items Addressed This Visit  Diabetes mellitus without complication ([place])    Discussed low carb diet and exercise.  Follow met b and a1c.  Recent a1c 6.7.        Family history of colon cancer    Colonoscopy 11/2016.        Hypercholesterolemia    On crestor.  Low cholesterol diet and exercise.  Follow lipid panel and liver function tests.   Lab Results  Component Value Date   CHOL 147 08/05/2019   HDL 44.20 08/05/2019   LDLCALC 84 08/05/2019   TRIG 94.0 08/05/2019   CHOLHDL 3 08/05/2019        Hypertension, essential    Blood pressure elevated on initial check.  Recheck improved.  Have her spot check her pressure.  Get her back in soon to reassess.  If persistent elevation, will adjust medication.         Other Visit Diagnoses    Visit for screening mammogram    -  Primary   Relevant Orders   MM 3D SCREEN BREAST BILATERAL       Einar Pheasant, MD

## 2019-08-08 NOTE — Assessment & Plan Note (Signed)
Discussed low carb diet and exercise.  Follow met b and a1c.  Recent a1c 6.7.   

## 2019-08-08 NOTE — Assessment & Plan Note (Signed)
Blood pressure elevated on initial check.  Recheck improved.  Have her spot check her pressure.  Get her back in soon to reassess.  If persistent elevation, will adjust medication.

## 2019-08-08 NOTE — Assessment & Plan Note (Signed)
On crestor.  Low cholesterol diet and exercise.  Follow lipid panel and liver function tests.   Lab Results  Component Value Date   CHOL 147 08/05/2019   HDL 44.20 08/05/2019   LDLCALC 84 08/05/2019   TRIG 94.0 08/05/2019   CHOLHDL 3 08/05/2019

## 2019-08-08 NOTE — Patient Instructions (Addendum)
  Sheri Cook , Thank you for taking time to come for your Medicare Wellness Visit. I appreciate your ongoing commitment to your health goals. Please review the following plan we discussed and let me know if I can assist you in the future.   These are the goals we discussed: Goals      Patient Stated   . Increase physical activity (pt-stated)     I want to walk more for exercise       This is a list of the screening recommended for you and due dates:  Health Maintenance  Topic Date Due  . Tetanus Vaccine  04/22/1960  . Complete foot exam   05/05/2019  . Flu Shot  01/25/2020*  . Mammogram  10/05/2019  . Hemoglobin A1C  02/03/2020  . Eye exam for diabetics  03/10/2020  . DEXA scan (bone density measurement)  Completed  . Pneumonia vaccines  Completed  *Topic was postponed. The date shown is not the original due date.

## 2019-09-07 ENCOUNTER — Encounter: Payer: Self-pay | Admitting: Internal Medicine

## 2019-09-23 ENCOUNTER — Other Ambulatory Visit: Payer: Self-pay | Admitting: Internal Medicine

## 2019-10-06 ENCOUNTER — Ambulatory Visit
Admission: RE | Admit: 2019-10-06 | Discharge: 2019-10-06 | Disposition: A | Discharge status: Home / Self Care | Payer: Medicare Other | Source: Ambulatory Visit | Attending: Internal Medicine | Admitting: Internal Medicine

## 2019-10-06 DIAGNOSIS — Z1231 Encounter for screening mammogram for malignant neoplasm of breast: Secondary | ICD-10-CM | POA: Diagnosis not present

## 2019-10-10 ENCOUNTER — Other Ambulatory Visit: Payer: Self-pay | Admitting: Internal Medicine

## 2019-10-10 DIAGNOSIS — R928 Other abnormal and inconclusive findings on diagnostic imaging of breast: Secondary | ICD-10-CM

## 2019-10-10 NOTE — Progress Notes (Signed)
Order placed for f/u left breast mammogram and ultrasound.   

## 2019-10-13 ENCOUNTER — Telehealth: Payer: Self-pay | Admitting: Internal Medicine

## 2019-10-13 NOTE — Telephone Encounter (Signed)
See result note.  

## 2019-10-13 NOTE — Telephone Encounter (Signed)
Patient returned Trisha's call. Per patient did not know what call was about.

## 2019-10-27 ENCOUNTER — Ambulatory Visit
Admission: RE | Admit: 2019-10-27 | Discharge: 2019-10-27 | Disposition: A | Discharge status: Home / Self Care | Payer: Medicare Other | Source: Ambulatory Visit | Attending: Internal Medicine | Admitting: Internal Medicine

## 2019-10-27 ENCOUNTER — Ambulatory Visit: Payer: Medicare Other | Admitting: Internal Medicine

## 2019-10-27 DIAGNOSIS — R928 Other abnormal and inconclusive findings on diagnostic imaging of breast: Secondary | ICD-10-CM | POA: Diagnosis not present

## 2019-10-31 ENCOUNTER — Other Ambulatory Visit: Payer: Self-pay | Admitting: Internal Medicine

## 2019-11-07 ENCOUNTER — Ambulatory Visit (INDEPENDENT_AMBULATORY_CARE_PROVIDER_SITE_OTHER): Payer: Medicare Other | Admitting: Internal Medicine

## 2019-11-07 ENCOUNTER — Other Ambulatory Visit: Payer: Self-pay

## 2019-11-07 ENCOUNTER — Telehealth: Payer: Self-pay | Admitting: Internal Medicine

## 2019-11-07 DIAGNOSIS — E7800 Pure hypercholesterolemia, unspecified: Secondary | ICD-10-CM

## 2019-11-07 DIAGNOSIS — R928 Other abnormal and inconclusive findings on diagnostic imaging of breast: Secondary | ICD-10-CM

## 2019-11-07 DIAGNOSIS — E119 Type 2 diabetes mellitus without complications: Secondary | ICD-10-CM

## 2019-11-07 DIAGNOSIS — I10 Essential (primary) hypertension: Secondary | ICD-10-CM

## 2019-11-07 DIAGNOSIS — Z800 Family history of malignant neoplasm of digestive organs: Secondary | ICD-10-CM

## 2019-11-07 NOTE — Assessment & Plan Note (Signed)
Recent mammogram read as Birads IV.  Discussed with her today.  Discussed referral to surgery.  She is in agreement.

## 2019-11-07 NOTE — Telephone Encounter (Signed)
Lm for pt to call back and schedule fasting labs in 6wks and a yearly follow up in 7m

## 2019-11-07 NOTE — Progress Notes (Signed)
[name], female   DOB: July 16, 1941, 79 y.o.   MRN: [id number]   Virtual Visit via telephone Note  This visit type was conducted due to national recommendations for restrictions regarding the COVID-19 pandemic (e.g. social distancing).  This format is felt to be most appropriate for this patient at this time.  All issues noted in this document were discussed and addressed.  No physical exam was performed (except for noted visual exam findings with Video Visits).   I connected with Sheri Cook by telephone and verified that I am speaking with the correct person using two identifiers. Location patient: home Location provider: work Persons participating in the virtual visit: patient, provider  I discussed the limitations, risks, security and privacy concerns of performing an evaluation and management service by telephone and the availability of in person appointments.  The patient expressed understanding and agreed to proceed.  Reason for visit: scheduled follow up.    HPI: States she is doing well.  Feels good.  Stays active.  Still working.  No chest pain or sob with increased activity or exertion.  Not going to the gym (secondary to covid).  She is walking.  No cough or congestion.  No acid reflux.  No abdominal pain.  Bowels moving.  Discussed last labs.  a1c 6.7. she is not checking her blood pressures.  Plans to start spot checking.  Discussed her recent mammogram.  She was aware of results.  Recommended biopsy.  Discussed surgery referral.  She is in agreement.  Questions answered.     ROS: See pertinent positives and negatives per HPI.  Past Medical History:  Diagnosis Date  . Essential hypertension   . History of chicken pox   . Hypercholesterolemia     Past Surgical History:  Procedure Laterality Date  . cataract surgery     bilateral  . COLONOSCOPY WITH PROPOFOL N/A 12/03/2016   Procedure: COLONOSCOPY WITH PROPOFOL;  Surgeon: Manya Silvas, MD;   Location: [facility] ENDOSCOPY;  Service: Endoscopy;  Laterality: N/A;    Family History  Problem Relation Age of Onset  . Hypertension Mother   . Congestive Heart Failure Mother   . Colon cancer Brother   . Breast cancer Neg Hx     SOCIAL HX: reviewed.    Current Outpatient Medications:  .  lisinopril-hydrochlorothiazide (ZESTORETIC) 20-12.5 MG tablet, Take 1 tablet by mouth once daily, Disp: 90 tablet, Rfl: 1 .  rosuvastatin (CRESTOR) 10 MG tablet, Take 1 tablet by mouth once daily, Disp: 90 tablet, Rfl: 0  Current Facility-Administered Medications:  .  triamcinolone acetonide (KENALOG) 10 MG/ML injection 10 mg, 10 mg, Other, Once, [place], [place], DPM  EXAM:  GENERAL: alert.  Sounds to be in no acute distress.  Answering questions appropriately.   PSYCH/NEURO: pleasant and cooperative, no obvious depression or anxiety, speech and thought processing grossly intact  ASSESSMENT AND PLAN:  Discussed the following assessment and plan:  Abnormal mammogram Recent mammogram read as Birads IV.  Discussed with her today.  Discussed referral to surgery.  She is in agreement.    Diabetes mellitus without complication (HCC) Low carb diet and exercise.  Last a1c 6.7.  Follow met b and a1c.    Family history of colon cancer Colonoscopy 11/2016.   Hypercholesterolemia On crestor.  Low cholesterol diet and exercise.  Follow lipid panel and liver function tests.    Hypertension, essential Blood pressure has been under good control.  Have her spot check her pressure.  Follow pressures  and follow metabolic panel.    Orders Placed This Encounter  Procedures  . Hemoglobin A1c    Standing Status:   Future    Standing Expiration Date:   11/06/2020  . Hepatic function panel    Standing Status:   Future    Standing Expiration Date:   11/06/2020  . Lipid panel    Standing Status:   Future    Standing Expiration Date:   11/06/2020  . TSH    Standing Status:   Future    Standing Expiration  Date:   11/06/2020  . Basic metabolic panel (future)    Standing Status:   Future    Standing Expiration Date:   11/06/2020  . DM Microalbumin / creatinine urine ratio    Standing Status:   Future    Standing Expiration Date:   11/06/2020  . Ambulatory referral to General Surgery    Referral Priority:   Routine    Referral Type:   Surgical    Referral Reason:   Specialty Services Required    Requested Specialty:   General Surgery    Number of Visits Requested:   1    No orders of the defined types were placed in this encounter.    I discussed the assessment and treatment plan with the patient. The patient was provided an opportunity to ask questions and all were answered. The patient agreed with the plan and demonstrated an understanding of the instructions.   The patient was advised to call back or seek an in-person evaluation if the symptoms worsen or if the condition fails to improve as anticipated.  I provided 15 minutes of non-face-to-face time during this encounter.   Einar Pheasant, MD

## 2019-11-12 ENCOUNTER — Encounter: Payer: Self-pay | Admitting: Internal Medicine

## 2019-11-12 NOTE — Assessment & Plan Note (Signed)
Colonoscopy 11/2016.  

## 2019-11-12 NOTE — Assessment & Plan Note (Signed)
Blood pressure has been under good control.  Have her spot check her pressure.  Follow pressures and follow metabolic panel.

## 2019-11-12 NOTE — Assessment & Plan Note (Signed)
On crestor.  Low cholesterol diet and exercise.  Follow lipid panel and liver function tests.   

## 2019-11-12 NOTE — Assessment & Plan Note (Signed)
Low carb diet and exercise.  Last a1c 6.7.  Follow met b and a1c.   

## 2019-12-20 ENCOUNTER — Other Ambulatory Visit (INDEPENDENT_AMBULATORY_CARE_PROVIDER_SITE_OTHER): Payer: Medicare Other

## 2019-12-20 ENCOUNTER — Other Ambulatory Visit: Payer: Self-pay

## 2019-12-20 DIAGNOSIS — E7800 Pure hypercholesterolemia, unspecified: Secondary | ICD-10-CM

## 2019-12-20 DIAGNOSIS — E119 Type 2 diabetes mellitus without complications: Secondary | ICD-10-CM | POA: Diagnosis not present

## 2019-12-20 DIAGNOSIS — I10 Essential (primary) hypertension: Secondary | ICD-10-CM

## 2019-12-20 LAB — MICROALBUMIN / CREATININE URINE RATIO
Creatinine,U: 72 mg/dL
Microalb Creat Ratio: 3.6 mg/g (ref 0.0–30.0)
Microalb, Ur: 2.6 mg/dL — ABNORMAL HIGH (ref 0.0–1.9)

## 2019-12-20 LAB — LIPID PANEL
Cholesterol: 157 mg/dL (ref 0–200)
HDL: 40.9 mg/dL (ref >39.00)
LDL Cholesterol: 95 mg/dL (ref 0–99)
NonHDL: 116.07
Total CHOL/HDL Ratio: 4
Triglycerides: 107 mg/dL (ref 0.0–149.0)
VLDL: 21.4 mg/dL (ref 0.0–40.0)

## 2019-12-20 LAB — BASIC METABOLIC PANEL
BUN: 30 mg/dL — ABNORMAL HIGH (ref 6–23)
CO2: 29 mEq/L (ref 19–32)
Calcium: 10.4 mg/dL (ref 8.4–10.5)
Chloride: 107 mEq/L (ref 96–112)
Creatinine, Ser: 1.21 mg/dL — ABNORMAL HIGH (ref 0.40–1.20)
GFR: 51.98 mL/min — ABNORMAL LOW (ref >60.00)
Glucose, Bld: 98 mg/dL (ref 70–99)
Potassium: 3.8 mEq/L (ref 3.5–5.1)
Sodium: 141 mEq/L (ref 135–145)

## 2019-12-20 LAB — HEPATIC FUNCTION PANEL
ALT: 29 U/L (ref 0–35)
AST: 22 U/L (ref 0–37)
Albumin: 4.5 g/dL (ref 3.5–5.2)
Alkaline Phosphatase: 68 U/L (ref 39–117)
Bilirubin, Direct: 0.1 mg/dL (ref 0.0–0.3)
Total Bilirubin: 0.5 mg/dL (ref 0.2–1.2)
Total Protein: 7 g/dL (ref 6.0–8.3)

## 2019-12-20 LAB — TSH: TSH: 2.29 u[IU]/mL (ref 0.35–4.50)

## 2019-12-22 LAB — HEMOGLOBIN A1C: Hgb A1c MFr Bld: 6.2 % (ref 4.6–6.5)

## 2019-12-28 ENCOUNTER — Telehealth: Payer: Self-pay | Admitting: Internal Medicine

## 2019-12-28 NOTE — Telephone Encounter (Signed)
Pt called to get lab results and to speck to [place]

## 2019-12-28 NOTE — Telephone Encounter (Signed)
See note

## 2020-01-02 ENCOUNTER — Telehealth: Payer: Self-pay | Admitting: Internal Medicine

## 2020-01-02 NOTE — Telephone Encounter (Signed)
Pt called saying that she was returning a call from Dr. Nicki Reaper

## 2020-01-02 NOTE — Telephone Encounter (Signed)
See result note.  

## 2020-01-04 NOTE — Telephone Encounter (Signed)
Called again and left pt a message.  If she calls back, please inform her that I do want to get her scheduled to have covid vaccine.

## 2020-01-04 NOTE — Telephone Encounter (Signed)
You were trying to reach this patient

## 2020-01-04 NOTE — Telephone Encounter (Signed)
Discussed this with you 

## 2020-01-04 NOTE — Telephone Encounter (Signed)
Pt states that she works second shift and the best time to reach her is around 1pm. The best number to reach her at that time is [phone]

## 2020-01-04 NOTE — Telephone Encounter (Signed)
Error - I meant to type breast biopsy instead of covid vaccine.  I do want to schedule if she is agreeable.  If not, please schedule appt to discuss if agreeable.

## 2020-01-05 NOTE — Telephone Encounter (Signed)
LMTCB

## 2020-02-02 ENCOUNTER — Other Ambulatory Visit: Payer: Self-pay | Admitting: Internal Medicine

## 2020-02-09 ENCOUNTER — Other Ambulatory Visit: Payer: Self-pay

## 2020-02-09 ENCOUNTER — Ambulatory Visit: Payer: Medicare Other | Admitting: Internal Medicine

## 2020-02-09 ENCOUNTER — Encounter: Payer: Self-pay | Admitting: Internal Medicine

## 2020-02-09 DIAGNOSIS — Z800 Family history of malignant neoplasm of digestive organs: Secondary | ICD-10-CM | POA: Diagnosis not present

## 2020-02-09 DIAGNOSIS — E119 Type 2 diabetes mellitus without complications: Secondary | ICD-10-CM

## 2020-02-09 DIAGNOSIS — E7800 Pure hypercholesterolemia, unspecified: Secondary | ICD-10-CM

## 2020-02-09 DIAGNOSIS — R928 Other abnormal and inconclusive findings on diagnostic imaging of breast: Secondary | ICD-10-CM

## 2020-02-09 DIAGNOSIS — I10 Essential (primary) hypertension: Secondary | ICD-10-CM

## 2020-02-09 NOTE — Progress Notes (Signed)
[name], female   DOB: 03/11/1941, 79 y.o.   MRN: [id number]   [name], female    DOB: 1941-03-23, 79 y.o.   MRN: [id number]  HPI This visit occurred during the SARS-CoV-2 public health emergency.  Safety protocols were in place, including screening questions prior to the visit, additional usage of staff PPE, and extensive cleaning of exam room while observing appropriate contact time as indicated for disinfecting solutions.  Patient here for a scheduled follow up. She reports she is doing well.  Feels good.  Staying active.  No chest pain or sob with increased activity or exertion.  No acid reflux.  No abdominal pain or cramping.  Bowels moving.  Does not check her blood pressure, but states has not felt like it was elevated.  Reports she can tell when elevated.  Gets anxious coming into the office.  Has felt good.  Recent abnormal mammogram.  Saw Dr Bary Castilla.  Biopsy recommended.  Have tried to scheduled.  She has been hesitant to schedule.  Discussed again with her today.  Agreed for me to schedule.    Past Medical History:  Diagnosis Date  . Essential hypertension   . History of chicken pox   . Hypercholesterolemia    Past Surgical History:  Procedure Laterality Date  . cataract surgery     bilateral  . COLONOSCOPY WITH PROPOFOL N/A 12/03/2016   Procedure: COLONOSCOPY WITH PROPOFOL;  Surgeon: Manya Silvas, MD;  Location[facility] ENDOSCOPY;  Service: Endoscopy;  Laterality: N/A;   Family History  Problem Relation Age of Onset  . Hypertension Mother   . Congestive Heart Failure Mother   . Colon cancer Brother   . Breast cancer Neg Hx    Social History   Socioeconomic History  . Marital status: Single    Spouse name: Not on file  . Number of children: Not on file  . Years of education: Not on file  . Highest education level: Not on file  Occupational History  . Not on file  Tobacco Use  . Smoking status: Never Smoker  .  Smokeless tobacco: Never Used  Substance and Sexual Activity  . Alcohol use: No    Alcohol/week: 0.0 standard drinks  . Drug use: No  . Sexual activity: Never  Other Topics Concern  . Not on file  Social History Narrative  . Not on file   Social Determinants of Health   Financial Resource Strain:   . Difficulty of Paying Living Expenses:   Food Insecurity:   . Worried About Charity fundraiser in the Last Year:   . Arboriculturist in the Last Year:   Transportation Needs:   . Film/video editor (Medical):   Marland Kitchen Lack of Transportation (Non-Medical):   Physical Activity:   . Days of Exercise per Week:   . Minutes of Exercise per Session:   Stress:   . Feeling of Stress :   Social Connections:   . Frequency of Communication with Friends and Family:   . Frequency of Social Gatherings with Friends and Family:   . Attends Religious Services:   . Active Member of Clubs or Organizations:   . Attends Archivist Meetings:   Marland Kitchen Marital Status:     Outpatient Encounter Medications as of 02/09/2020  Medication Sig  . lisinopril-hydrochlorothiazide (ZESTORETIC) 20-12.5 MG tablet Take 1 tablet by mouth once daily  . rosuvastatin (CRESTOR) 10 MG tablet Take 1  tablet by mouth once daily   Facility-Administered Encounter Medications as of 02/09/2020  Medication  . triamcinolone acetonide (KENALOG) 10 MG/ML injection 10 mg    Review of Systems  Constitutional: Negative for appetite change and unexpected weight change.  HENT: Negative for congestion and sinus pressure.   Respiratory: Negative for cough, chest tightness and shortness of breath.   Cardiovascular: Negative for chest pain, palpitations and leg swelling.  Gastrointestinal: Negative for abdominal pain, diarrhea, nausea and vomiting.  Genitourinary: Negative for difficulty urinating and dysuria.  Musculoskeletal: Negative for joint swelling and myalgias.  Skin: Negative for color change and rash.  Neurological:  Negative for dizziness, light-headedness and headaches.  Psychiatric/Behavioral: Negative for agitation and dysphoric mood.       Objective:    Physical Exam Vitals reviewed.  Constitutional:      General: She is not in acute distress.    Appearance: Normal appearance.  HENT:     Head: Normocephalic and atraumatic.     Right Ear: External ear normal.     Left Ear: External ear normal.  Eyes:     General: No scleral icterus.       Right eye: No discharge.        Left eye: No discharge.     Conjunctiva/sclera: Conjunctivae normal.  Neck:     Thyroid: No thyromegaly.  Cardiovascular:     Rate and Rhythm: Normal rate and regular rhythm.  Pulmonary:     Effort: No respiratory distress.     Breath sounds: Normal breath sounds. No wheezing.  Abdominal:     General: Bowel sounds are normal.     Palpations: Abdomen is soft.     Tenderness: There is no abdominal tenderness.  Musculoskeletal:        General: No swelling or tenderness.     Cervical back: Neck supple. No tenderness.  Lymphadenopathy:     Cervical: No cervical adenopathy.  Skin:    Findings: No erythema or rash.  Neurological:     Mental Status: She is alert.  Psychiatric:        Mood and Affect: Mood normal.        Behavior: Behavior normal.     BP 140/78   Pulse 70   Temp (!) 96.6 F (35.9 C) (Temporal)   Ht 5' 4"  (1.626 m)   Wt 183 lb 9.6 oz (83.3 kg)   SpO2 96%   BMI 31.51 kg/m  Wt Readings from Last 3 Encounters:  02/09/20 183 lb 9.6 oz (83.3 kg)  11/07/19 180 lb (81.6 kg)  08/08/19 180 lb 12.8 oz (82 kg)     Lab Results  Component Value Date   WBC 4.2 08/05/2019   HGB 12.6 08/05/2019   HCT 38.4 08/05/2019   PLT 236.0 08/05/2019   GLUCOSE 98 12/20/2019   CHOL 157 12/20/2019   TRIG 107.0 12/20/2019   HDL 40.90 12/20/2019   LDLCALC 95 12/20/2019   ALT 29 12/20/2019   AST 22 12/20/2019   NA 141 12/20/2019   K 3.8 12/20/2019   CL 107 12/20/2019   CREATININE 1.21 (H) 12/20/2019   BUN  30 (H) 12/20/2019   CO2 29 12/20/2019   TSH 2.29 12/20/2019   HGBA1C 6.2 12/20/2019   MICROALBUR 2.6 (H) 12/20/2019    MM DIAG BREAST TOMO UNI LEFT  Result Date: 10/27/2019 CLINICAL DATA:  Patient returns today to evaluate a possible LEFT breast distortion. EXAM: DIGITAL DIAGNOSTIC UNILATERAL LEFT MAMMOGRAM WITH CAD AND TOMO COMPARISON:  Previous exams including recent screening mammogram dated 10/06/2019. ACR Breast Density Category c: The breast tissue is heterogeneously dense, which may obscure small masses. FINDINGS: On today's additional diagnostic images with spot compression and 3D tomosynthesis, there is a persistent possible subtle architectural distortion within the outer LEFT breast, most conspicuous on the SCREENING mammogram CC slice 55. Mammographic images were processed with CAD. IMPRESSION: Possible subtle architectural distortion within the outer LEFT breast. Stereotactic biopsy is recommended. RECOMMENDATION: Stereotactic biopsy, with 3D tomosynthesis guidance, for the possible subtle architectural distortion within the outer LEFT breast. Ordering physician will be contacted and patient will be scheduled for stereotactic biopsy at her earliest convenience. I have discussed the findings and recommendations with the patient. If applicable, a reminder letter will be sent to the patient regarding the next appointment. BI-RADS CATEGORY  4: Suspicious. Electronically Signed   By: Franki Cabot M.D.   On: 10/27/2019 11:09       Assessment & Plan:   Problem List Items Addressed This Visit    Abnormal mammogram    Recent abnormal mammogram.  Recommended biopsy. Pt has been reluctant to schedule and is still reluctant, but after discussion did agree.  Plan for biopsy.        Diabetes mellitus without complication (HCC)    Low carb diet and exercise.  On no medication.  Follow met b and a1c.       Relevant Orders   Hemoglobin A1c   Family history of colon cancer    Colonoscopy  11/2016.        Hypercholesterolemia    On crestor.  Low cholesterol diet and exercise.  Follow lipid panel and liver function tests.        Relevant Orders   Hepatic function panel   Lipid panel   Hypertension, essential    Blood pressure on recheck improved.  She gets anxious coming into the office.  Will continue lisinopril/hctz.  Follow pressures.  Follow metabolic panel.       Relevant Orders   Basic metabolic panel       Einar Pheasant, MD

## 2020-02-13 ENCOUNTER — Other Ambulatory Visit: Payer: Self-pay | Admitting: Internal Medicine

## 2020-02-13 ENCOUNTER — Encounter: Payer: Self-pay | Admitting: Internal Medicine

## 2020-02-13 ENCOUNTER — Telehealth: Payer: Self-pay | Admitting: Internal Medicine

## 2020-02-13 DIAGNOSIS — R928 Other abnormal and inconclusive findings on diagnostic imaging of breast: Secondary | ICD-10-CM

## 2020-02-13 NOTE — Assessment & Plan Note (Signed)
Colonoscopy 11/2016.  

## 2020-02-13 NOTE — Assessment & Plan Note (Signed)
Low carb diet and exercise.  On no medication.  Follow met b and a1c.  

## 2020-02-13 NOTE — Assessment & Plan Note (Signed)
Blood pressure on recheck improved.  She gets anxious coming into the office.  Will continue lisinopril/hctz.  Follow pressures.  Follow metabolic panel.

## 2020-02-13 NOTE — Telephone Encounter (Signed)
Spoke with Aldona Bar at [facility]. She is going to place orders for you to sign. She also noted that she is going to check with the radiologist to see if they would like to do f/u views to confirm no change since it has been 4 months since last mammogram. If repeat mammogram is warranted, she will place order for that as well per radiology recommendation. She will contact patient to schedule once orders are in and signed.

## 2020-02-13 NOTE — Telephone Encounter (Signed)
Pt finally agreed to a breast biopsy.  I am not sure how to order.  Can you call mammography and see if they can help with ordering or let us know of someone who can.

## 2020-02-13 NOTE — Assessment & Plan Note (Signed)
On crestor.  Low cholesterol diet and exercise.  Follow lipid panel and liver function tests.   

## 2020-02-13 NOTE — Assessment & Plan Note (Signed)
Recent abnormal mammogram.  Recommended biopsy. Pt has been reluctant to schedule and is still reluctant, but after discussion did agree.  Plan for biopsy.

## 2020-02-13 NOTE — Telephone Encounter (Signed)
Orders signed.

## 2020-02-23 ENCOUNTER — Ambulatory Visit
Admission: RE | Admit: 2020-02-23 | Discharge: 2020-02-23 | Disposition: A | Discharge status: Home / Self Care | Payer: Medicare Other | Source: Ambulatory Visit | Attending: Internal Medicine | Admitting: Internal Medicine

## 2020-02-23 DIAGNOSIS — R928 Other abnormal and inconclusive findings on diagnostic imaging of breast: Secondary | ICD-10-CM

## 2020-02-23 HISTORY — PX: BREAST BIOPSY: SHX20

## 2020-02-24 LAB — SURGICAL PATHOLOGY

## 2020-02-28 ENCOUNTER — Telehealth: Payer: Self-pay

## 2020-02-28 NOTE — Telephone Encounter (Signed)
PATIENT HAS AN APPT WITH DR. TOTH ON MAY 14 AT 10:40.  PATIENT IS AWARE OF TIME/DATE OF APPT.

## 2020-03-09 ENCOUNTER — Ambulatory Visit: Payer: Self-pay | Admitting: General Surgery

## 2020-03-09 DIAGNOSIS — N6022 Fibroadenosis of left breast: Secondary | ICD-10-CM

## 2020-03-16 ENCOUNTER — Other Ambulatory Visit: Payer: Self-pay | Admitting: General Surgery

## 2020-03-16 DIAGNOSIS — N6022 Fibroadenosis of left breast: Secondary | ICD-10-CM

## 2020-04-09 ENCOUNTER — Other Ambulatory Visit: Payer: Self-pay | Admitting: Internal Medicine

## 2020-04-18 ENCOUNTER — Other Ambulatory Visit: Payer: Self-pay

## 2020-04-18 ENCOUNTER — Encounter (HOSPITAL_BASED_OUTPATIENT_CLINIC_OR_DEPARTMENT_OTHER): Payer: Self-pay | Admitting: General Surgery

## 2020-04-21 ENCOUNTER — Other Ambulatory Visit (HOSPITAL_COMMUNITY): Payer: Medicare Other

## 2020-04-23 ENCOUNTER — Other Ambulatory Visit (HOSPITAL_COMMUNITY)
Admission: RE | Admit: 2020-04-23 | Discharge: 2020-04-23 | Disposition: A | Discharge status: Home / Self Care | Payer: Medicare Other | Source: Ambulatory Visit | Attending: General Surgery | Admitting: General Surgery

## 2020-04-23 ENCOUNTER — Encounter (HOSPITAL_BASED_OUTPATIENT_CLINIC_OR_DEPARTMENT_OTHER)
Admission: RE | Admit: 2020-04-23 | Discharge: 2020-04-23 | Disposition: A | Discharge status: Home / Self Care | Payer: Medicare Other | Source: Ambulatory Visit | Attending: General Surgery | Admitting: General Surgery

## 2020-04-23 DIAGNOSIS — N6489 Other specified disorders of breast: Secondary | ICD-10-CM | POA: Diagnosis present

## 2020-04-23 DIAGNOSIS — Z01812 Encounter for preprocedural laboratory examination: Secondary | ICD-10-CM | POA: Diagnosis present

## 2020-04-23 DIAGNOSIS — D242 Benign neoplasm of left breast: Secondary | ICD-10-CM | POA: Diagnosis not present

## 2020-04-23 DIAGNOSIS — Z20822 Contact with and (suspected) exposure to covid-19: Secondary | ICD-10-CM | POA: Insufficient documentation

## 2020-04-23 DIAGNOSIS — I10 Essential (primary) hypertension: Secondary | ICD-10-CM | POA: Diagnosis not present

## 2020-04-23 DIAGNOSIS — Z79899 Other long term (current) drug therapy: Secondary | ICD-10-CM | POA: Diagnosis not present

## 2020-04-23 DIAGNOSIS — E7800 Pure hypercholesterolemia, unspecified: Secondary | ICD-10-CM | POA: Diagnosis not present

## 2020-04-23 LAB — BASIC METABOLIC PANEL
Anion gap: 8 (ref 5–15)
BUN: 22 mg/dL (ref 8–23)
CO2: 23 mmol/L (ref 22–32)
Calcium: 9.5 mg/dL (ref 8.9–10.3)
Chloride: 110 mmol/L (ref 98–111)
Creatinine, Ser: 1.06 mg/dL — ABNORMAL HIGH (ref 0.44–1.00)
GFR calc Af Amer: 58 mL/min — ABNORMAL LOW (ref >60)
GFR calc non Af Amer: 50 mL/min — ABNORMAL LOW (ref >60)
Glucose, Bld: 102 mg/dL — ABNORMAL HIGH (ref 70–99)
Potassium: 3.9 mmol/L (ref 3.5–5.1)
Sodium: 141 mmol/L (ref 135–145)

## 2020-04-23 LAB — SARS CORONAVIRUS 2 (TAT 6-24 HRS): SARS Coronavirus 2: NEGATIVE

## 2020-04-23 NOTE — Progress Notes (Signed)

## 2020-04-24 ENCOUNTER — Other Ambulatory Visit: Payer: Self-pay

## 2020-04-24 ENCOUNTER — Ambulatory Visit
Admission: RE | Admit: 2020-04-24 | Discharge: 2020-04-24 | Disposition: A | Discharge status: Home / Self Care | Payer: Medicare Other | Source: Ambulatory Visit | Attending: General Surgery | Admitting: General Surgery

## 2020-04-24 DIAGNOSIS — N6022 Fibroadenosis of left breast: Secondary | ICD-10-CM

## 2020-04-25 ENCOUNTER — Ambulatory Visit (HOSPITAL_BASED_OUTPATIENT_CLINIC_OR_DEPARTMENT_OTHER): Payer: Medicare Other | Admitting: Certified Registered"

## 2020-04-25 ENCOUNTER — Encounter (HOSPITAL_BASED_OUTPATIENT_CLINIC_OR_DEPARTMENT_OTHER): Payer: Self-pay | Admitting: General Surgery

## 2020-04-25 ENCOUNTER — Ambulatory Visit (HOSPITAL_BASED_OUTPATIENT_CLINIC_OR_DEPARTMENT_OTHER)
Admission: RE | Admit: 2020-04-25 | Discharge: 2020-04-25 | Disposition: A | Discharge status: Home / Self Care | Payer: Medicare Other | Attending: General Surgery | Admitting: General Surgery

## 2020-04-25 ENCOUNTER — Other Ambulatory Visit: Payer: Self-pay

## 2020-04-25 ENCOUNTER — Ambulatory Visit
Admission: RE | Admit: 2020-04-25 | Discharge: 2020-04-25 | Disposition: A | Discharge status: Home / Self Care | Payer: Medicare Other | Source: Ambulatory Visit | Attending: General Surgery | Admitting: General Surgery

## 2020-04-25 ENCOUNTER — Encounter (HOSPITAL_BASED_OUTPATIENT_CLINIC_OR_DEPARTMENT_OTHER)
Admission: RE | Disposition: A | Discharge status: Home / Self Care | Payer: Self-pay | Source: Home / Self Care | Attending: General Surgery

## 2020-04-25 DIAGNOSIS — Z79899 Other long term (current) drug therapy: Secondary | ICD-10-CM | POA: Insufficient documentation

## 2020-04-25 DIAGNOSIS — I10 Essential (primary) hypertension: Secondary | ICD-10-CM | POA: Insufficient documentation

## 2020-04-25 DIAGNOSIS — D242 Benign neoplasm of left breast: Secondary | ICD-10-CM | POA: Insufficient documentation

## 2020-04-25 DIAGNOSIS — E7800 Pure hypercholesterolemia, unspecified: Secondary | ICD-10-CM | POA: Diagnosis not present

## 2020-04-25 DIAGNOSIS — N6022 Fibroadenosis of left breast: Secondary | ICD-10-CM

## 2020-04-25 HISTORY — PX: BREAST LUMPECTOMY WITH RADIOACTIVE SEED LOCALIZATION: SHX6424

## 2020-04-25 HISTORY — DX: Unspecified lump in the left breast, unspecified quadrant: N63.20

## 2020-04-25 SURGERY — BREAST LUMPECTOMY WITH RADIOACTIVE SEED LOCALIZATION
Anesthesia: General | Site: Breast | Laterality: Left

## 2020-04-25 MED ORDER — GABAPENTIN 300 MG PO CAPS: 300.0000 mg | ORAL_CAPSULE | ORAL | Status: DC

## 2020-04-25 MED ORDER — HYDRALAZINE HCL 20 MG/ML IJ SOLN
INTRAMUSCULAR | Status: DC | PRN
  Administered 2020-04-25: 5 mg via INTRAVENOUS

## 2020-04-25 MED ORDER — BUPIVACAINE HCL (PF) 0.25 % IJ SOLN
INTRAMUSCULAR | Status: AC
  Filled 2020-04-25: qty 30

## 2020-04-25 MED ORDER — FENTANYL CITRATE (PF) 250 MCG/5ML IJ SOLN
INTRAMUSCULAR | Status: DC | PRN
  Administered 2020-04-25 (×2): 25 ug via INTRAVENOUS

## 2020-04-25 MED ORDER — OXYCODONE HCL 5 MG/5ML PO SOLN: 5.0000 mg | Freq: Once | ORAL | Status: DC | PRN

## 2020-04-25 MED ORDER — PROPOFOL 10 MG/ML IV BOLUS
INTRAVENOUS | Status: DC | PRN
  Administered 2020-04-25: 120 mg via INTRAVENOUS

## 2020-04-25 MED ORDER — LIDOCAINE 2% (20 MG/ML) 5 ML SYRINGE
INTRAMUSCULAR | Status: AC
  Filled 2020-04-25: qty 5

## 2020-04-25 MED ORDER — ONDANSETRON HCL 4 MG/2ML IJ SOLN
INTRAMUSCULAR | Status: DC | PRN
  Administered 2020-04-25: 4 mg via INTRAVENOUS

## 2020-04-25 MED ORDER — CELECOXIB 200 MG PO CAPS
ORAL_CAPSULE | ORAL | Status: AC
  Filled 2020-04-25: qty 1

## 2020-04-25 MED ORDER — FENTANYL CITRATE (PF) 100 MCG/2ML IJ SOLN
INTRAMUSCULAR | Status: AC
  Filled 2020-04-25: qty 2

## 2020-04-25 MED ORDER — CHLORHEXIDINE GLUCONATE CLOTH 2 % EX PADS: 6.0000 | MEDICATED_PAD | Freq: Once | CUTANEOUS | Status: DC

## 2020-04-25 MED ORDER — PROPOFOL 10 MG/ML IV BOLUS
INTRAVENOUS | Status: AC
  Filled 2020-04-25: qty 20

## 2020-04-25 MED ORDER — CEFAZOLIN SODIUM-DEXTROSE 2-4 GM/100ML-% IV SOLN
2.0000 g | INTRAVENOUS | Status: AC
  Administered 2020-04-25: 2 g via INTRAVENOUS

## 2020-04-25 MED ORDER — HYDROCODONE-ACETAMINOPHEN 5-325 MG PO TABS: 1.0000 | ORAL_TABLET | Freq: Four times a day (QID) | ORAL | 0 refills | Status: DC | PRN

## 2020-04-25 MED ORDER — HYDROMORPHONE HCL 1 MG/ML IJ SOLN: 0.2500 mg | INTRAMUSCULAR | Status: DC | PRN

## 2020-04-25 MED ORDER — CEFAZOLIN SODIUM-DEXTROSE 2-4 GM/100ML-% IV SOLN
INTRAVENOUS | Status: AC
  Filled 2020-04-25: qty 100

## 2020-04-25 MED ORDER — LACTATED RINGERS IV SOLN: INTRAVENOUS | Status: DC

## 2020-04-25 MED ORDER — PROMETHAZINE HCL 25 MG/ML IJ SOLN: 6.2500 mg | INTRAMUSCULAR | Status: DC | PRN

## 2020-04-25 MED ORDER — EPHEDRINE SULFATE 50 MG/ML IJ SOLN
INTRAMUSCULAR | Status: DC | PRN
  Administered 2020-04-25: 10 mg via INTRAVENOUS

## 2020-04-25 MED ORDER — ACETAMINOPHEN 500 MG PO TABS
1000.0000 mg | ORAL_TABLET | ORAL | Status: AC
  Administered 2020-04-25: 1000 mg via ORAL

## 2020-04-25 MED ORDER — BUPIVACAINE HCL (PF) 0.25 % IJ SOLN
INTRAMUSCULAR | Status: DC | PRN
  Administered 2020-04-25: 20 mL

## 2020-04-25 MED ORDER — LIDOCAINE 2% (20 MG/ML) 5 ML SYRINGE
INTRAMUSCULAR | Status: DC | PRN
  Administered 2020-04-25: 80 mg via INTRAVENOUS

## 2020-04-25 MED ORDER — OXYCODONE HCL 5 MG PO TABS: 5.0000 mg | ORAL_TABLET | Freq: Once | ORAL | Status: DC | PRN

## 2020-04-25 MED ORDER — ACETAMINOPHEN 500 MG PO TABS
ORAL_TABLET | ORAL | Status: AC
  Filled 2020-04-25: qty 2

## 2020-04-25 MED ORDER — AMISULPRIDE (ANTIEMETIC) 5 MG/2ML IV SOLN: 10.0000 mg | Freq: Once | INTRAVENOUS | Status: DC | PRN

## 2020-04-25 MED ORDER — CELECOXIB 200 MG PO CAPS
200.0000 mg | ORAL_CAPSULE | ORAL | Status: AC
  Administered 2020-04-25: 200 mg via ORAL

## 2020-04-25 SURGICAL SUPPLY — 45 items
APPLIER CLIP 9.375 MED OPEN (MISCELLANEOUS)
BLADE SURG 15 STRL LF DISP TIS (BLADE) ×1 IMPLANT
BLADE SURG 15 STRL SS (BLADE) ×1
CANISTER SUC SOCK COL 7IN (MISCELLANEOUS) ×2 IMPLANT
CANISTER SUCT 1200ML W/VALVE (MISCELLANEOUS) ×2 IMPLANT
CHLORAPREP W/TINT 26 (MISCELLANEOUS) ×2 IMPLANT
CLIP APPLIE 9.375 MED OPEN (MISCELLANEOUS) IMPLANT
COVER BACK TABLE 60X90IN (DRAPES) ×2 IMPLANT
COVER MAYO STAND STRL (DRAPES) ×2 IMPLANT
COVER PROBE W GEL 5X96 (DRAPES) ×2 IMPLANT
COVER WAND RF STERILE (DRAPES) IMPLANT
DECANTER SPIKE VIAL GLASS SM (MISCELLANEOUS) IMPLANT
DERMABOND ADVANCED (GAUZE/BANDAGES/DRESSINGS) ×1
DERMABOND ADVANCED .7 DNX12 (GAUZE/BANDAGES/DRESSINGS) ×1 IMPLANT
DRAPE LAPAROSCOPIC ABDOMINAL (DRAPES) ×2 IMPLANT
DRAPE UTILITY XL STRL (DRAPES) ×2 IMPLANT
ELECT COATED BLADE 2.86 ST (ELECTRODE) ×2 IMPLANT
ELECT REM PT RETURN 9FT ADLT (ELECTROSURGICAL) ×2
ELECTRODE REM PT RTRN 9FT ADLT (ELECTROSURGICAL) ×1 IMPLANT
GLOVE BIO SURGEON STRL SZ7.5 (GLOVE) ×4 IMPLANT
GLOVE BIOGEL PI IND STRL 7.0 (GLOVE) ×2 IMPLANT
GLOVE BIOGEL PI INDICATOR 7.0 (GLOVE) ×2
GLOVE SURG SYN 7.5  E (GLOVE) ×1
GLOVE SURG SYN 7.5 E (GLOVE) ×1 IMPLANT
GOWN STRL REUS W/ TWL LRG LVL3 (GOWN DISPOSABLE) ×1 IMPLANT
GOWN STRL REUS W/ TWL XL LVL3 (GOWN DISPOSABLE) ×1 IMPLANT
GOWN STRL REUS W/TWL LRG LVL3 (GOWN DISPOSABLE) ×1
GOWN STRL REUS W/TWL XL LVL3 (GOWN DISPOSABLE) ×1
ILLUMINATOR WAVEGUIDE N/F (MISCELLANEOUS) IMPLANT
KIT MARKER MARGIN INK (KITS) ×2 IMPLANT
LIGHT WAVEGUIDE WIDE FLAT (MISCELLANEOUS) IMPLANT
NEEDLE HYPO 25X1 1.5 SAFETY (NEEDLE) IMPLANT
NS IRRIG 1000ML POUR BTL (IV SOLUTION) IMPLANT
PENCIL SMOKE EVACUATOR (MISCELLANEOUS) ×2 IMPLANT
SET BASIN DAY SURGERY F.S. (CUSTOM PROCEDURE TRAY) ×2 IMPLANT
SLEEVE SCD COMPRESS KNEE MED (MISCELLANEOUS) ×2 IMPLANT
SPONGE LAP 18X18 RF (DISPOSABLE) ×2 IMPLANT
SUT MON AB 4-0 PC3 18 (SUTURE) ×2 IMPLANT
SUT SILK 2 0 SH (SUTURE) IMPLANT
SUT VICRYL 3-0 CR8 SH (SUTURE) ×2 IMPLANT
SYR CONTROL 10ML LL (SYRINGE) IMPLANT
TOWEL GREEN STERILE FF (TOWEL DISPOSABLE) ×2 IMPLANT
TRAY FAXITRON CT DISP (TRAY / TRAY PROCEDURE) ×2 IMPLANT
TUBE CONNECTING 20X1/4 (TUBING) ×2 IMPLANT
YANKAUER SUCT BULB TIP NO VENT (SUCTIONS) IMPLANT

## 2020-04-25 NOTE — H&P (Signed)
Sheri Cook  Location: [place] Office Patient #: [id number] DOB: 02/19/1941 Unknown / Language: Cleophus Molt / Race: Black or African American Female   History of Present Illness  The patient is a 79 year old female who presents with a breast mass. we are asked to see the patient in consultation by Dr. Einar Pheasant to evaluate her for distortion of the left breast. The patient is a 79 year old black female who recently went for a routine screening mammogram. At that time she was found to have a 1 cm area of distortion in the lateral left breast. This was biopsied and came back as a possible area of complex sclerosing lesion. She denies any breast pain. She denies any discharge from the nipple. She has no family history of breast cancer. She does not smoke.   Past Surgical History  Breast Biopsy  Left.  Diagnostic Studies History Colonoscopy  5-10 years ago Mammogram  within last year  Allergies  No Known Drug Allergies    Medication History  Lisinopril-hydroCHLOROthiazide (20-12.5MG  Tablet, Oral) Active. Rosuvastatin Calcium (10MG  Tablet, Oral) Active.  Social History No alcohol use  No caffeine use  No drug use  Tobacco use  Never smoker.  Family History  Colon Cancer  Brother. Respiratory Condition  Brother.  Pregnancy / Birth History  Age at menarche  31 years. Age of menopause  4-55 Gravida  0 Para  0 Regular periods   Other Problems  High blood pressure     Review of Systems General Not Present- Appetite Loss, Chills, Fatigue, Fever, Night Sweats, Weight Gain and Weight Loss. Skin Present- Change in Wart/Mole and Dryness. Not Present- Hives, Jaundice, New Lesions, Non-Healing Wounds, Rash and Ulcer. HEENT Present- Wears glasses/contact lenses. Not Present- Earache, Hearing Loss, Hoarseness, Nose Bleed, Oral Ulcers, Ringing in the Ears, Seasonal Allergies, Sinus Pain, Sore Throat, Visual Disturbances and Yellow Eyes. Cardiovascular  Present- Leg Cramps. Not Present- Chest Pain, Difficulty Breathing Lying Down, Palpitations, Rapid Heart Rate, Shortness of Breath and Swelling of Extremities. Gastrointestinal Not Present- Abdominal Pain, Bloating, Bloody Stool, Change in Bowel Habits, Chronic diarrhea, Constipation, Difficulty Swallowing, Excessive gas, Gets full quickly at meals, Hemorrhoids, Indigestion, Nausea, Rectal Pain and Vomiting. Female Genitourinary Not Present- Frequency, Nocturia, Painful Urination, Pelvic Pain and Urgency. Musculoskeletal Not Present- Back Pain, Joint Pain, Joint Stiffness, Muscle Pain, Muscle Weakness and Swelling of Extremities. Neurological Not Present- Decreased Memory, Fainting, Headaches, Numbness, Seizures, Tingling, Tremor, Trouble walking and Weakness. Psychiatric Not Present- Anxiety, Bipolar, Change in Sleep Pattern, Depression, Fearful and Frequent crying. Endocrine Present- Hot flashes. Not Present- Cold Intolerance, Excessive Hunger, Hair Changes, Heat Intolerance and New Diabetes. Hematology Not Present- Blood Thinners, Easy Bruising, Excessive bleeding, Gland problems, HIV and Persistent Infections.  Vitals  Weight: 179.25 lb Height: 63in Body Surface Area: 1.85 m Body Mass Index: 31.75 kg/m  Pulse: 71 (Regular)        Physical Exam  General Mental Status-Alert. General Appearance-Consistent with stated age. Hydration-Well hydrated. Voice-Normal.  Head and Neck Head-normocephalic, atraumatic with no lesions or palpable masses. Trachea-midline. Thyroid Gland Characteristics - normal size and consistency.  Eye Eyeball - Bilateral-Extraocular movements intact. Sclera/Conjunctiva - Bilateral-No scleral icterus.  Chest and Lung Exam Chest and lung exam reveals -quiet, even and easy respiratory effort with no use of accessory muscles and on auscultation, normal breath sounds, no adventitious sounds and normal vocal  resonance. Inspection Chest Wall - Normal. Back - normal.  Breast Note: there is no palpable mass in either breast. There is no  palpable axillary, supraclavicular, or cervical lymphadenopathy.   Cardiovascular Cardiovascular examination reveals -normal heart sounds, regular rate and rhythm with no murmurs and normal pedal pulses bilaterally.  Abdomen Inspection Inspection of the abdomen reveals - No Hernias. Skin - Scar - no surgical scars. Palpation/Percussion Palpation and Percussion of the abdomen reveal - Soft, Non Tender, No Rebound tenderness, No Rigidity (guarding) and No hepatosplenomegaly. Auscultation Auscultation of the abdomen reveals - Bowel sounds normal.  Neurologic Neurologic evaluation reveals -alert and oriented x 3 with no impairment of recent or remote memory. Mental Status-Normal.  Musculoskeletal Normal Exam - Left-Upper Extremity Strength Normal and Lower Extremity Strength Normal. Normal Exam - Right-Upper Extremity Strength Normal and Lower Extremity Strength Normal.  Lymphatic Head & Neck  General Head & Neck Lymphatics: Bilateral - Description - Normal. Axillary  General Axillary Region: Bilateral - Description - Normal. Tenderness - Non Tender. Femoral & Inguinal  Generalized Femoral & Inguinal Lymphatics: Bilateral - Description - Normal. Tenderness - Non Tender.    Assessment & Plan SCLEROSING ADENOSIS OF BREAST, LEFT (N60.22) Impression: the patient appears to have a 1 cm area of distortion in the lateral left breast that showed a possible area of complex sclerosing lesion on pathology. Because this diagnosis could carry a slightly increased risk of breast cancer and because there is a 5-10% chance of missing something more significant the recommendation is to have this area removed. She would also like to have this done. I have discussed with her in detail the risks and benefits of the operation as well as some of the technical  aspects including the use of a radioactive seed for localization and she understands and wishes to proceed. This patient encounter took 45 minutes today to perform the following: take history, perform exam, review outside records, interpret imaging, counsel the patient on their diagnosis and document encounter, findings & plan in the EHR  Addendum Note(Rayland Hamed S. Marlou Starks MD; 03/09/2020 11:08 AM) The patient asked to have surgery in [place] since they have family that works at [employer]

## 2020-04-25 NOTE — Op Note (Signed)
04/25/2020  8:58 AM  PATIENT:  Sheri Cook  79 y.o. female  PRE-OPERATIVE DIAGNOSIS:  LEFT BREAST COMPLEX SCLEROSING LESION  POST-OPERATIVE DIAGNOSIS:  LEFT BREAST COMPLEX SCLEROSING LESION  PROCEDURE:  Procedure(s): LEFT BREAST LUMPECTOMY WITH RADIOACTIVE SEED LOCALIZATION (Left)  SURGEON:  Surgeon(s) and Role:    Jovita Kussmaul, MD - Primary  PHYSICIAN ASSISTANT:   ASSISTANTS: none   ANESTHESIA:   local and general  EBL:  Minimal   BLOOD ADMINISTERED:none  DRAINS: none   LOCAL MEDICATIONS USED:  MARCAINE     SPECIMEN:  Source of Specimen:  left breast tissue  DISPOSITION OF SPECIMEN:  PATHOLOGY  COUNTS:  YES  TOURNIQUET:  * No tourniquets in log *  DICTATION: .Dragon Dictation   After informed consent was obtained the patient was brought to the operating room and placed in the supine position on the operating table. After adequate induction of general anesthesia the patient's left breast was prepped with ChloraPrep, allowed to dry, and draped in usual sterile manner. An appropriate timeout was performed. Previously an I-125 seed was placed in the lateral aspect of the left breast to mark an area of complex sclerosing lesion. The neoprobe was set to I-125 and the area of radioactivity was readily identified. The area around this was infiltrated with quarter percent Marcaine. A curvilinear incision was made along the lower outer aspect of the left breast with a 15 blade knife. The incision was carried through the skin and subcutaneous tissue sharply with the electrocautery. Dissection was then carried towards the radioactive seed under the direction of the neoprobe. Once I more closely approached the radioactive seed I then removed a circular portion of breast tissue sharply with the electrocautery around the radioactive seed while checking the area of radioactivity frequently. Once the specimen was removed it was oriented with the appropriate paint colors. A specimen  radiograph was obtained that showed the clip and seed to be near the center of the specimen. The specimen was then sent to pathology for further evaluation. Hemostasis was achieved using the Bovie electrocautery. The wound was irrigated with saline and infiltrated with more quarter percent Marcaine. The deep layer of the wound was then closed with layers of interrupted 3-0 Vicryl stitches. The skin was closed with a running 4-0 Monocryl subcuticular stitch. Dermabond dressings were applied. The patient tolerated the procedure well. At the end of the case all needle sponge and instrument counts were correct. The patient was then awakened and taken to recovery in stable condition.  PLAN OF CARE: Discharge to home after PACU  PATIENT DISPOSITION:  PACU - hemodynamically stable.   Delay start of Pharmacological VTE agent (>24hrs) due to surgical blood loss or risk of bleeding: not applicable

## 2020-04-25 NOTE — Interval H&P Note (Signed)
History and Physical Interval Note:  04/25/2020 7:50 AM  Sheri Cook  has presented today for surgery, with the diagnosis of LEFT BREAST COMPLEX SCLEROSING LESION.  The various methods of treatment have been discussed with the patient and family. After consideration of risks, benefits and other options for treatment, the patient has consented to  Procedure(s): LEFT BREAST LUMPECTOMY WITH RADIOACTIVE SEED LOCALIZATION (Left) as a surgical intervention.  The patient's history has been reviewed, patient examined, no change in status, stable for surgery.  I have reviewed the patient's chart and labs.  Questions were answered to the patient's satisfaction.     Autumn Messing III

## 2020-04-25 NOTE — Transfer of Care (Signed)
Immediate Anesthesia Transfer of Care Note  Patient: Sheri Cook  Procedure(s) Performed: LEFT BREAST LUMPECTOMY WITH RADIOACTIVE SEED LOCALIZATION (Left Breast)  Patient Location: PACU  Anesthesia Type:General  Level of Consciousness: awake, alert , oriented and patient cooperative  Airway & Oxygen Therapy: Patient Spontanous Breathing and Patient connected to face mask oxygen  Post-op Assessment: Report given to RN, Post -op Vital signs reviewed and stable and Patient moving all extremities  Post vital signs: Reviewed and stable  Last Vitals:  Vitals Value Taken Time  BP 149/68 04/25/20 0902  Temp    Pulse 68 04/25/20 0904  Resp 19 04/25/20 0904  SpO2 100 % 04/25/20 0904  Vitals shown include unvalidated device data.  Last Pain:  Vitals:   04/25/20 0713  TempSrc: Oral  PainSc: 0-No pain         Complications: No complications documented.

## 2020-04-25 NOTE — Anesthesia Preprocedure Evaluation (Signed)
Anesthesia Evaluation  Patient identified by MRN, date of birth, ID band Patient awake    Reviewed: Allergy & Precautions, NPO status , Patient's Chart, lab work & pertinent test results  History of Anesthesia Complications Negative for: history of anesthetic complications  Airway Mallampati: III  TM Distance: >3 FB Neck ROM: Full    Dental  (+) Caps   Pulmonary neg pulmonary ROS, neg sleep apnea, neg COPD,    breath sounds clear to auscultation- rhonchi (-) wheezing      Cardiovascular Exercise Tolerance: Good hypertension, Pt. on medications (-) CAD and (-) Past MI  Rhythm:Regular Rate:Normal - Systolic murmurs and - Diastolic murmurs    Neuro/Psych negative neurological ROS  negative psych ROS   GI/Hepatic negative GI ROS, Neg liver ROS,   Endo/Other  negative endocrine ROSneg diabetes  Renal/GU negative Renal ROS     Musculoskeletal negative musculoskeletal ROS (+)   Abdominal (+) + obese,   Peds  Hematology negative hematology ROS (+)   Anesthesia Other Findings Past Medical History: No date: Essential hypertension No date: History of chicken pox No date: Hypercholesterolemia   Reproductive/Obstetrics                             Anesthesia Physical  Anesthesia Plan  ASA: II  Anesthesia Plan: General   Post-op Pain Management:    Induction: Intravenous  PONV Risk Score and Plan: 3 and Ondansetron, Dexamethasone, Midazolam and Treatment may vary due to age or medical condition  Airway Management Planned: LMA  Additional Equipment:   Intra-op Plan:   Post-operative Plan: Extubation in OR  Informed Consent: I have reviewed the patients History and Physical, chart, labs and discussed the procedure including the risks, benefits and alternatives for the proposed anesthesia with the patient or authorized representative who has indicated his/her understanding and acceptance.      Dental advisory given  Plan Discussed with: CRNA and Anesthesiologist  Anesthesia Plan Comments:         Anesthesia Quick Evaluation

## 2020-04-25 NOTE — Anesthesia Procedure Notes (Signed)
Procedure Name: LMA Insertion Date/Time: 04/25/2020 8:22 AM Performed by: Myna Bright, CRNA Pre-anesthesia Checklist: Patient identified, Emergency Drugs available, Suction available and Patient being monitored Patient Re-evaluated:Patient Re-evaluated prior to induction Oxygen Delivery Method: Circle System Utilized Preoxygenation: Pre-oxygenation with 100% oxygen Induction Type: IV induction Ventilation: Mask ventilation without difficulty LMA: LMA inserted LMA Size: 4.0 Number of attempts: 1 Airway Equipment and Method: Bite block Placement Confirmation: positive ETCO2 Tube secured with: Tape Dental Injury: Teeth and Oropharynx as per pre-operative assessment

## 2020-04-25 NOTE — Discharge Instructions (Signed)
Next dose of Tylenol and Ibuprofen at 1:30 PM   Post Anesthesia Home Care Instructions  Activity: Get plenty of rest for the remainder of the day. A responsible individual must stay with you for 24 hours following the procedure.  For the next 24 hours, DO NOT: -Drive a car -Paediatric nurse -Drink alcoholic beverages -Take any medication unless instructed by your physician -Make any legal decisions or sign important papers.  Meals: Start with liquid foods such as gelatin or soup. Progress to regular foods as tolerated. Avoid greasy, spicy, heavy foods. If nausea and/or vomiting occur, drink only clear liquids until the nausea and/or vomiting subsides. Call your physician if vomiting continues.  Special Instructions/Symptoms: Your throat may feel dry or sore from the anesthesia or the breathing tube placed in your throat during surgery. If this causes discomfort, gargle with warm salt water. The discomfort should disappear within 24 hours.  If you had a scopolamine patch placed behind your ear for the management of post- operative nausea and/or vomiting:  1. The medication in the patch is effective for 72 hours, after which it should be removed.  Wrap patch in a tissue and discard in the trash. Wash hands thoroughly with soap and water. 2. You may remove the patch earlier than 72 hours if you experience unpleasant side effects which may include dry mouth, dizziness or visual disturbances. 3. Avoid touching the patch. Wash your hands with soap and water after contact with the patch.

## 2020-04-25 NOTE — Anesthesia Postprocedure Evaluation (Signed)
Anesthesia Post Note  Patient: Sheri Cook  Procedure(s) Performed: LEFT BREAST LUMPECTOMY WITH RADIOACTIVE SEED LOCALIZATION (Left Breast)     Patient location during evaluation: PACU Anesthesia Type: General Level of consciousness: awake and alert Pain management: pain level controlled Vital Signs Assessment: post-procedure vital signs reviewed and stable Respiratory status: spontaneous breathing, nonlabored ventilation and respiratory function stable Cardiovascular status: blood pressure returned to baseline and stable Postop Assessment: no apparent nausea or vomiting Anesthetic complications: no   No complications documented.  Last Vitals:  Vitals:   04/25/20 0919 04/25/20 0925  BP:  (!) 142/66  Pulse: 65 65  Resp: (!) 23 16  Temp:  36.5 C  SpO2: 99% 100%    Last Pain:  Vitals:   04/25/20 0925  TempSrc: Oral  PainSc: 0-No pain                 Lynda Rainwater

## 2020-04-26 ENCOUNTER — Encounter (HOSPITAL_BASED_OUTPATIENT_CLINIC_OR_DEPARTMENT_OTHER): Payer: Self-pay | Admitting: General Surgery

## 2020-04-27 LAB — SURGICAL PATHOLOGY

## 2020-05-10 LAB — HM DIABETES EYE EXAM

## 2020-05-14 ENCOUNTER — Other Ambulatory Visit (INDEPENDENT_AMBULATORY_CARE_PROVIDER_SITE_OTHER): Payer: Medicare Other

## 2020-05-14 ENCOUNTER — Other Ambulatory Visit: Payer: Self-pay

## 2020-05-14 DIAGNOSIS — E7800 Pure hypercholesterolemia, unspecified: Secondary | ICD-10-CM | POA: Diagnosis not present

## 2020-05-14 DIAGNOSIS — I10 Essential (primary) hypertension: Secondary | ICD-10-CM | POA: Diagnosis not present

## 2020-05-14 DIAGNOSIS — E119 Type 2 diabetes mellitus without complications: Secondary | ICD-10-CM | POA: Diagnosis not present

## 2020-05-14 LAB — BASIC METABOLIC PANEL
BUN: 23 mg/dL (ref 6–23)
CO2: 26 mEq/L (ref 19–32)
Calcium: 9.9 mg/dL (ref 8.4–10.5)
Chloride: 111 mEq/L (ref 96–112)
Creatinine, Ser: 1.1 mg/dL (ref 0.40–1.20)
GFR: 57.97 mL/min — ABNORMAL LOW (ref >60.00)
Glucose, Bld: 102 mg/dL — ABNORMAL HIGH (ref 70–99)
Potassium: 3.9 mEq/L (ref 3.5–5.1)
Sodium: 144 mEq/L (ref 135–145)

## 2020-05-14 LAB — LIPID PANEL
Cholesterol: 177 mg/dL (ref 0–200)
HDL: 43 mg/dL (ref >39.00)
LDL Cholesterol: 111 mg/dL — ABNORMAL HIGH (ref 0–99)
NonHDL: 134.3
Total CHOL/HDL Ratio: 4
Triglycerides: 117 mg/dL (ref 0.0–149.0)
VLDL: 23.4 mg/dL (ref 0.0–40.0)

## 2020-05-14 LAB — HEPATIC FUNCTION PANEL
ALT: 20 U/L (ref 0–35)
AST: 18 U/L (ref 0–37)
Albumin: 4.2 g/dL (ref 3.5–5.2)
Alkaline Phosphatase: 59 U/L (ref 39–117)
Bilirubin, Direct: 0 mg/dL (ref 0.0–0.3)
Total Bilirubin: 0.4 mg/dL (ref 0.2–1.2)
Total Protein: 6.6 g/dL (ref 6.0–8.3)

## 2020-05-14 LAB — HEMOGLOBIN A1C: Hgb A1c MFr Bld: 6.4 % (ref 4.6–6.5)

## 2020-05-15 ENCOUNTER — Other Ambulatory Visit: Payer: Medicare Other

## 2020-05-18 ENCOUNTER — Other Ambulatory Visit: Payer: Self-pay

## 2020-05-18 ENCOUNTER — Ambulatory Visit (INDEPENDENT_AMBULATORY_CARE_PROVIDER_SITE_OTHER): Payer: Medicare Other | Admitting: Internal Medicine

## 2020-05-18 VITALS — BP 134/78 | HR 60 | Temp 98.2°F | Resp 16 | Ht 63.0 in | Wt 183.0 lb

## 2020-05-18 DIAGNOSIS — E119 Type 2 diabetes mellitus without complications: Secondary | ICD-10-CM

## 2020-05-18 DIAGNOSIS — Z0000 Encounter for general adult medical examination without abnormal findings: Secondary | ICD-10-CM

## 2020-05-18 DIAGNOSIS — E7800 Pure hypercholesterolemia, unspecified: Secondary | ICD-10-CM

## 2020-05-18 DIAGNOSIS — I10 Essential (primary) hypertension: Secondary | ICD-10-CM | POA: Diagnosis not present

## 2020-05-18 DIAGNOSIS — N1831 Chronic kidney disease, stage 3a: Secondary | ICD-10-CM

## 2020-05-18 LAB — HM DIABETES FOOT EXAM

## 2020-05-18 NOTE — Assessment & Plan Note (Addendum)
Physical today 05/18/20.  Mammogram 10/26/20 - birads 4.  S/p left breast lumpectomy for complex sclerosing lesion and intraductal papilloma (Dr Marlou Starks).  Recommended continuing yearly mammogram. Colonoscopy 11/2016 - diverticulosis.

## 2020-05-18 NOTE — Progress Notes (Signed)
[name], female   DOB: Oct 07, 1941, 79 y.o.   MRN: [id number]   [name], female    DOB: [birth year], 78 y.o.   MRN: [id number]  HPI This visit occurred during the SARS-CoV-2 public health emergency.  Safety protocols were in place, including screening questions prior to the visit, additional usage of staff PPE, and extensive cleaning of exam room while observing appropriate contact time as indicated for disinfecting solutions.  Patient here for her physical exam. She reports she is doing well.  Feels good.  Stays active.  No chest pain or sob.  No acid reflux.  No abdominal pain.  Bowels moving.  No cough or congestion.  Just evaluated for abnormal mammogram.  Pathology - sclerosing adenosis left breast.  S/p left breast lumpectomy for complex sclerosing lesion and an intraductal papilloma (Dr Marlou Starks).  Recommended continuing yearly mammograms and no further w/up or evaluation.  Discussed labs.    Past Medical History:  Diagnosis Date  . Essential hypertension   . History of chicken pox   . Hypercholesterolemia   . Left breast mass    Past Surgical History:  Procedure Laterality Date  . BREAST BIOPSY Left 02/23/2020   Affirm Biopsy- X- Clip, path pending  . BREAST LUMPECTOMY WITH RADIOACTIVE SEED LOCALIZATION Left 04/25/2020   Procedure: LEFT BREAST LUMPECTOMY WITH RADIOACTIVE SEED LOCALIZATION;  Surgeon: Jovita Kussmaul, MD;  Location: [facility];  Service: General;  Laterality: Left;  . cataract surgery     bilateral  . COLONOSCOPY WITH PROPOFOL N/A 12/03/2016   Procedure: COLONOSCOPY WITH PROPOFOL;  Surgeon: Manya Silvas, MD;  Location: [facility] ENDOSCOPY;  Service: Endoscopy;  Laterality: N/A;   Family History  Problem Relation Age of Onset  . Hypertension Mother   . Congestive Heart Failure Mother   . Colon cancer Brother   . Breast cancer Neg Hx    Social History   Socioeconomic History  . Marital status: Single      Spouse name: Not on file  . Number of children: Not on file  . Years of education: Not on file  . Highest education level: Not on file  Occupational History  . Not on file  Tobacco Use  . Smoking status: Never Smoker  . Smokeless tobacco: Never Used  Substance and Sexual Activity  . Alcohol use: No    Alcohol/week: 0.0 standard drinks  . Drug use: No  . Sexual activity: Not Currently    Birth control/protection: Post-menopausal  Other Topics Concern  . Not on file  Social History Narrative  . Not on file   Social Determinants of Health   Financial Resource Strain:   . Difficulty of Paying Living Expenses:   Food Insecurity:   . Worried About Charity fundraiser in the Last Year:   . Arboriculturist in the Last Year:   Transportation Needs:   . Film/video editor (Medical):   Marland Kitchen Lack of Transportation (Non-Medical):   Physical Activity:   . Days of Exercise per Week:   . Minutes of Exercise per Session:   Stress:   . Feeling of Stress :   Social Connections:   . Frequency of Communication with Friends and Family:   . Frequency of Social Gatherings with Friends and Family:   . Attends Religious Services:   . Active Member of Clubs or Organizations:   . Attends Archivist Meetings:   Marland Kitchen Marital Status:  Outpatient Encounter Medications as of 05/18/2020  Medication Sig  . HYDROcodone-acetaminophen (NORCO/VICODIN) 5-325 MG tablet Take 1-2 tablets by mouth every 6 (six) hours as needed for moderate pain or severe pain.  Marland Kitchen lisinopril-hydrochlorothiazide (ZESTORETIC) 20-12.5 MG tablet Take 1 tablet by mouth once daily  . rosuvastatin (CRESTOR) 10 MG tablet Take 1 tablet by mouth once daily   No facility-administered encounter medications on file as of 05/18/2020.   Review of Systems  Constitutional: Negative for appetite change and unexpected weight change.  HENT: Negative for congestion and sinus pressure.   Eyes: Negative for pain and visual  disturbance.  Respiratory: Negative for cough, chest tightness and shortness of breath.   Cardiovascular: Negative for chest pain, palpitations and leg swelling.  Gastrointestinal: Negative for abdominal pain, diarrhea, nausea and vomiting.  Genitourinary: Negative for difficulty urinating and dysuria.  Musculoskeletal: Negative for joint swelling and myalgias.  Skin: Negative for color change and rash.  Neurological: Negative for dizziness, light-headedness and headaches.  Hematological: Negative for adenopathy. Does not bruise/bleed easily.  Psychiatric/Behavioral: Negative for agitation and dysphoric mood.       Objective:    Physical Exam Constitutional:      General: She is not in acute distress.    Appearance: Normal appearance. She is well-developed.  HENT:     Head: Normocephalic and atraumatic.     Right Ear: External ear normal.     Left Ear: External ear normal.  Eyes:     General: No scleral icterus.       Right eye: No discharge.        Left eye: No discharge.     Conjunctiva/sclera: Conjunctivae normal.  Neck:     Thyroid: No thyromegaly.  Cardiovascular:     Rate and Rhythm: Normal rate and regular rhythm.  Pulmonary:     Effort: No tachypnea, accessory muscle usage or respiratory distress.     Breath sounds: Normal breath sounds. No decreased breath sounds or wheezing.     Comments: Well healed incision site - left breast.  Chest:     Breasts:        Right: No inverted nipple, mass, nipple discharge or tenderness (no axillary adenopathy).        Left: No inverted nipple, mass, nipple discharge or tenderness (no axilarry adenopathy).  Abdominal:     General: Bowel sounds are normal.     Palpations: Abdomen is soft.     Tenderness: There is no abdominal tenderness.  Musculoskeletal:        General: No swelling or tenderness.     Cervical back: Neck supple. No tenderness.  Lymphadenopathy:     Cervical: No cervical adenopathy.  Skin:    Findings: No  erythema or rash.  Neurological:     Mental Status: She is alert and oriented to person, place, and time.  Psychiatric:        Mood and Affect: Mood normal.        Behavior: Behavior normal.     BP (!) 134/78   Pulse 60   Temp 98.2 F (36.8 C)   Resp 16   Ht 5' 3"  (1.6 m)   Wt 183 lb (83 kg)   SpO2 97%   BMI 32.42 kg/m  Wt Readings from Last 3 Encounters:  05/18/20 183 lb (83 kg)  04/25/20 181 lb 14.1 oz (82.5 kg)  02/09/20 183 lb 9.6 oz (83.3 kg)     Lab Results  Component Value Date   WBC 4.2  08/05/2019   HGB 12.6 08/05/2019   HCT 38.4 08/05/2019   PLT 236.0 08/05/2019   GLUCOSE 102 (H) 05/14/2020   CHOL 177 05/14/2020   TRIG 117.0 05/14/2020   HDL 43.00 05/14/2020   LDLCALC 111 (H) 05/14/2020   ALT 20 05/14/2020   AST 18 05/14/2020   NA 144 05/14/2020   K 3.9 05/14/2020   CL 111 05/14/2020   CREATININE 1.10 05/14/2020   BUN 23 05/14/2020   CO2 26 05/14/2020   TSH 2.29 12/20/2019   HGBA1C 6.4 05/14/2020   MICROALBUR 2.6 (H) 12/20/2019    MM Breast Surgical Specimen  Result Date: 04/25/2020 CLINICAL DATA:  Excisional biopsy a high risk complex sclerosing lesion and sclerosing intraductal papilloma from the LEFT breast. Radioactive seed localization was performed yesterday. EXAM: SPECIMEN RADIOGRAPH OF THE LEFT BREAST COMPARISON:  Previous exam(s). FINDINGS: Status post excision of the LEFT breast. The radioactive seed and X shaped tissue marker clip are present in the specimen. This was discussed directly with the operating room nurse at the time of interpretation on 04/25/2020 at 8:45 a.m. IMPRESSION: Specimen radiograph of the LEFT breast. Electronically Signed   By: Evangeline Dakin M.D.   On: 04/25/2020 08:47       Assessment & Plan:   Problem List Items Addressed This Visit    CKD (chronic kidney disease) stage 3, GFR 30-59 ml/min    GFR 57.  Stable.  Avoid antiinflammatories.  Say hydrated.  Follow.        Diabetes mellitus without complication  (HCC)    Low carb diet and exercise.  Follow met b and a1c.   Lab Results  Component Value Date   HGBA1C 6.4 05/14/2020        Relevant Orders   Hemoglobin A1c   Health care maintenance    Physical today 05/18/20.  Mammogram 10/26/20 - birads 4.  S/p left breast lumpectomy for complex sclerosing lesion and intraductal papilloma (Dr Marlou Starks).  Recommended continuing yearly mammogram. Colonoscopy 11/2016 - diverticulosis.        Hypercholesterolemia    On crestor.  Discussed recent cholesterol labs.  Goal LDL 70.  Discussed increasing crestor.  She declined. Wants to continue on 80m q day.  Low cholesterol diet and exercise.  Follow lipoid panel and liver function tests.        Relevant Orders   Hepatic function panel   Lipid panel   Hypertension, essential    Blood pressure as outlined.  Continue lisinopril/hctz.  Follow pressures.  Follow metabolic panel.        Relevant Orders   CBC with Differential/Platelet   Basic metabolic panel       CEinar Pheasant MD

## 2020-05-20 ENCOUNTER — Encounter: Payer: Self-pay | Admitting: Internal Medicine

## 2020-05-20 NOTE — Assessment & Plan Note (Signed)
On crestor.  Discussed recent cholesterol labs.  Goal LDL 70.  Discussed increasing crestor.  She declined. Wants to continue on 10mg  q day.  Low cholesterol diet and exercise.  Follow lipoid panel and liver function tests.

## 2020-05-20 NOTE — Assessment & Plan Note (Signed)
Low carb diet and exercise.  Follow met b and a1c.   Lab Results  Component Value Date   HGBA1C 6.4 05/14/2020

## 2020-05-20 NOTE — Assessment & Plan Note (Signed)
Blood pressure as outlined.  Continue lisinopril/hctz.  Follow pressures.  Follow metabolic panel.  

## 2020-05-20 NOTE — Assessment & Plan Note (Signed)
GFR 57.  Stable.  Avoid antiinflammatories.  Say hydrated.  Follow.

## 2020-05-21 ENCOUNTER — Telehealth: Payer: Self-pay | Admitting: Internal Medicine

## 2020-05-21 NOTE — Telephone Encounter (Signed)
-----   Message from Cammie Sickle, MD sent at 05/21/2020  8:23 AM EDT ----- Regarding: RE: question Good Morning- I do not have any different recommendations- apart from annual mammo/physical exam for this pt with benign findings especially given her age.  Thanks GB ----- Message ----- From: Einar Pheasant, MD Sent: 05/20/2020   8:08 AM EDT To: Cammie Sickle, MD Subject: question                                       Ms Gilles recently had an abnormal mammogram.  Biopsy revealed sclerosing adenosis of left breast.  She is s/p left breast lumpectomy for a complex sclerosing lesion and an intraductal papilloma (Dr Marlou Starks).  He recommended continuing yearly mammograms - given these are benign and "minimally change her risk of breast cancer".  Is there anything more that I need to do or does she need any f/u with you for monitoring? Thank you for your help (always).    Thank you again.  Jeson Camacho

## 2020-05-28 ENCOUNTER — Other Ambulatory Visit: Payer: Self-pay | Admitting: Internal Medicine

## 2020-07-23 ENCOUNTER — Other Ambulatory Visit: Payer: Self-pay | Admitting: Internal Medicine

## 2020-07-24 ENCOUNTER — Other Ambulatory Visit: Payer: Self-pay

## 2020-07-24 MED ORDER — LISINOPRIL-HYDROCHLOROTHIAZIDE 20-12.5 MG PO TABS: 1.0000 | ORAL_TABLET | Freq: Every day | ORAL | 0 refills | Status: DC

## 2020-08-03 ENCOUNTER — Ambulatory Visit (INDEPENDENT_AMBULATORY_CARE_PROVIDER_SITE_OTHER): Payer: Medicare Other

## 2020-08-03 ENCOUNTER — Other Ambulatory Visit: Payer: Self-pay

## 2020-08-03 DIAGNOSIS — Z23 Encounter for immunization: Secondary | ICD-10-CM | POA: Diagnosis not present

## 2020-08-08 ENCOUNTER — Ambulatory Visit (INDEPENDENT_AMBULATORY_CARE_PROVIDER_SITE_OTHER): Payer: Medicare Other

## 2020-08-08 VITALS — Ht 63.0 in | Wt 183.0 lb

## 2020-08-08 DIAGNOSIS — Z0000 Encounter for general adult medical examination without abnormal findings: Secondary | ICD-10-CM

## 2020-08-08 NOTE — Patient Instructions (Addendum)
Sheri Cook , Thank you for taking time to come for your Medicare Wellness Visit. I appreciate your ongoing commitment to your health goals. Please review the following plan we discussed and let me know if I can assist you in the future.   These are the goals we discussed: Goals      Patient Stated   .  I'd like to lose some weight (pt-stated)      I want to walk more for exercise Portion control       This is a list of the screening recommended for you and due dates:  Health Maintenance  Topic Date Due  . Tetanus Vaccine  08/08/2021*  .  Hepatitis C: One time screening is recommended by Center for Disease Control  (CDC) for  adults born from [birth year] through 1965.   08/08/2021*  . Mammogram  10/05/2020  . Hemoglobin A1C  11/14/2020  . Eye exam for diabetics  05/10/2021  . Complete foot exam   05/18/2021  . Flu Shot  Completed  . DEXA scan (bone density measurement)  Completed  . COVID-19 Vaccine  Completed  . Pneumonia vaccines  Completed  *Topic was postponed. The date shown is not the original due date.    Immunizations Immunization History  Administered Date(s) Administered  . Fluad Quad(high Dose 65+) 08/08/2019, 08/03/2020  . Influenza, High Dose Seasonal PF 07/28/2016, 07/30/2017, 08/02/2018  . Influenza-Unspecified 08/08/2014, 07/13/2015  . Moderna SARS-COVID-2 Vaccination 01/26/2020, 02/16/2020  . Pneumococcal Conjugate-13 07/27/2015  . Pneumococcal Polysaccharide-23 12/15/2016  . Zoster Recombinat (Shingrix) 09/21/2019   Keep all routine maintenance appointments.   Next scheduled fasting lab 09/24/20  Follow up 09/27/20  Advanced directives: yes, on file  Conditions/risks identified: none new  Follow up in one year for your annual wellness visit.   Preventive Care 27 Years and Older, Female Preventive care refers to lifestyle choices and visits with your health care provider that can promote health and wellness. What does preventive care include?  A  yearly physical exam. This is also called an annual well check.  Dental exams once or twice a year.  Routine eye exams. Ask your health care provider how often you should have your eyes checked.  Personal lifestyle choices, including:  Daily care of your teeth and gums.  Regular physical activity.  Eating a healthy diet.  Avoiding tobacco and drug use.  Limiting alcohol use.  Practicing safe sex.  Taking low-dose aspirin every day.  Taking vitamin and mineral supplements as recommended by your health care provider. What happens during an annual well check? The services and screenings done by your health care provider during your annual well check will depend on your age, overall health, lifestyle risk factors, and family history of disease. Counseling  Your health care provider may ask you questions about your:  Alcohol use.  Tobacco use.  Drug use.  Emotional well-being.  Home and relationship well-being.  Sexual activity.  Eating habits.  History of falls.  Memory and ability to understand (cognition).  Work and work Statistician.  Reproductive health. Screening  You may have the following tests or measurements:  Height, weight, and BMI.  Blood pressure.  Lipid and cholesterol levels. These may be checked every 5 years, or more frequently if you are over 59 years old.  Skin check.  Lung cancer screening. You may have this screening every year starting at age 70 if you have a 30-pack-year history of smoking and currently smoke or have quit within the past  15 years.  Fecal occult blood test (FOBT) of the stool. You may have this test every year starting at age 40.  Flexible sigmoidoscopy or colonoscopy. You may have a sigmoidoscopy every 5 years or a colonoscopy every 10 years starting at age 25.  Hepatitis C blood test.  Hepatitis B blood test.  Sexually transmitted disease (STD) testing.  Diabetes screening. This is done by checking your blood  sugar (glucose) after you have not eaten for a while (fasting). You may have this done every 1-3 years.  Bone density scan. This is done to screen for osteoporosis. You may have this done starting at age 79.  Mammogram. This may be done every 1-2 years. Talk to your health care provider about how often you should have regular mammograms. Talk with your health care provider about your test results, treatment options, and if necessary, the need for more tests. Vaccines  Your health care provider may recommend certain vaccines, such as:  Influenza vaccine. This is recommended every year.  Tetanus, diphtheria, and acellular pertussis (Tdap, Td) vaccine. You may need a Td booster every 10 years.  Zoster vaccine. You may need this after age 36.  Pneumococcal 13-valent conjugate (PCV13) vaccine. One dose is recommended after age 43.  Pneumococcal polysaccharide (PPSV23) vaccine. One dose is recommended after age 2. Talk to your health care provider about which screenings and vaccines you need and how often you need them. This information is not intended to replace advice given to you by your health care provider. Make sure you discuss any questions you have with your health care provider. Document Released: 11/09/2015 Document Revised: 07/02/2016 Document Reviewed: 08/14/2015 Elsevier Interactive Patient Education  2017 [place] Prevention in the Home Falls can cause injuries. They can happen to people of all ages. There are many things you can do to make your home safe and to help prevent falls. What can I do on the outside of my home?  Regularly fix the edges of walkways and driveways and fix any cracks.  Remove anything that might make you trip as you walk through a door, such as a raised step or threshold.  Trim any bushes or trees on the path to your home.  Use bright outdoor lighting.  Clear any walking paths of anything that might make someone trip, such as rocks or  tools.  Regularly check to see if handrails are loose or broken. Make sure that both sides of any steps have handrails.  Any raised decks and porches should have guardrails on the edges.  Have any leaves, snow, or ice cleared regularly.  Use sand or salt on walking paths during winter.  Clean up any spills in your garage right away. This includes oil or grease spills. What can I do in the bathroom?  Use night lights.  Install grab bars by the toilet and in the tub and shower. Do not use towel bars as grab bars.  Use non-skid mats or decals in the tub or shower.  If you need to sit down in the shower, use a plastic, non-slip stool.  Keep the floor dry. Clean up any water that spills on the floor as soon as it happens.  Remove soap buildup in the tub or shower regularly.  Attach bath mats securely with double-sided non-slip rug tape.  Do not have throw rugs and other things on the floor that can make you trip. What can I do in the bedroom?  Use night lights.  Make sure that you have a light by your bed that is easy to reach.  Do not use any sheets or blankets that are too big for your bed. They should not hang down onto the floor.  Have a firm chair that has side arms. You can use this for support while you get dressed.  Do not have throw rugs and other things on the floor that can make you trip. What can I do in the kitchen?  Clean up any spills right away.  Avoid walking on wet floors.  Keep items that you use a lot in easy-to-reach places.  If you need to reach something above you, use a strong step stool that has a grab bar.  Keep electrical cords out of the way.  Do not use floor polish or wax that makes floors slippery. If you must use wax, use non-skid floor wax.  Do not have throw rugs and other things on the floor that can make you trip. What can I do with my stairs?  Do not leave any items on the stairs.  Make sure that there are handrails on both  sides of the stairs and use them. Fix handrails that are broken or loose. Make sure that handrails are as long as the stairways.  Check any carpeting to make sure that it is firmly attached to the stairs. Fix any carpet that is loose or worn.  Avoid having throw rugs at the top or bottom of the stairs. If you do have throw rugs, attach them to the floor with carpet tape.  Make sure that you have a light switch at the top of the stairs and the bottom of the stairs. If you do not have them, ask someone to add them for you. What else can I do to help prevent falls?  Wear shoes that:  Do not have high heels.  Have rubber bottoms.  Are comfortable and fit you well.  Are closed at the toe. Do not wear sandals.  If you use a stepladder:  Make sure that it is fully opened. Do not climb a closed stepladder.  Make sure that both sides of the stepladder are locked into place.  Ask someone to hold it for you, if possible.  Clearly mark and make sure that you can see:  Any grab bars or handrails.  First and last steps.  Where the edge of each step is.  Use tools that help you move around (mobility aids) if they are needed. These include:  Canes.  Walkers.  Scooters.  Crutches.  Turn on the lights when you go into a dark area. Replace any light bulbs as soon as they burn out.  Set up your furniture so you have a clear path. Avoid moving your furniture around.  If any of your floors are uneven, fix them.  If there are any pets around you, be aware of where they are.  Review your medicines with your doctor. Some medicines can make you feel dizzy. This can increase your chance of falling. Ask your doctor what other things that you can do to help prevent falls. This information is not intended to replace advice given to you by your health care provider. Make sure you discuss any questions you have with your health care provider. Document Released: 08/09/2009 Document Revised:  03/20/2016 Document Reviewed: 11/17/2014 Elsevier Interactive Patient Education  2017 [employer].

## 2020-08-08 NOTE — Progress Notes (Addendum)
Subjective:   Sheri Cook is a 79 y.o. female who presents for Medicare Annual (Subsequent) preventive examination.  Review of Systems    No ROS.  Medicare Wellness Virtual Visit.     Cardiac Risk Factors include: advanced age (>78men, >22 women)     Objective:    Today's Vitals   08/08/20 0932  Weight: 183 lb (83 kg)  Height: 5\' 3"  (1.6 m)   Body mass index is 32.42 kg/m.  Advanced Directives 08/08/2020 04/25/2020 04/18/2020 08/08/2019 08/02/2018 07/30/2017 07/28/2016  Does Patient Have a Medical Advance Directive? Yes Yes No Yes Yes Yes Yes  Type of Paramedic of [place];Living will - - [place];Living will Living will;Healthcare Power of [place];Living will [place];Living will  Does patient want to make changes to medical advance directive? No - Patient declined No - Patient declined - No - Patient declined No - Patient declined No - Patient declined No - Patient declined  Copy of [place] in Chart? Yes - validated most recent copy scanned in chart (See row information) - - Yes - validated most recent copy scanned in chart (See row information) Yes Yes Yes  Would patient like information on creating a medical advance directive? - No - Patient declined No - Patient declined - - - -    Current Medications (verified) Outpatient Encounter Medications as of 08/08/2020  Medication Sig  . HYDROcodone-acetaminophen (NORCO/VICODIN) 5-325 MG tablet Take 1-2 tablets by mouth every 6 (six) hours as needed for moderate pain or severe pain.  Marland Kitchen lisinopril-hydrochlorothiazide (ZESTORETIC) 20-12.5 MG tablet Take 1 tablet by mouth daily.  . rosuvastatin (CRESTOR) 10 MG tablet Take 1 tablet by mouth once daily   No facility-administered encounter medications on file as of 08/08/2020.    Allergies (verified) Patient has no known allergies.   History: Past Medical History:    Diagnosis Date  . Essential hypertension   . History of chicken pox   . Hypercholesterolemia   . Left breast mass    Past Surgical History:  Procedure Laterality Date  . BREAST BIOPSY Left 02/23/2020   Affirm Biopsy- X- Clip, path pending  . BREAST LUMPECTOMY WITH RADIOACTIVE SEED LOCALIZATION Left 04/25/2020   Procedure: LEFT BREAST LUMPECTOMY WITH RADIOACTIVE SEED LOCALIZATION;  Surgeon: Jovita Kussmaul, MD;  Location: [facility];  Service: General;  Laterality: Left;  . cataract surgery     bilateral  . COLONOSCOPY WITH PROPOFOL N/A 12/03/2016   Procedure: COLONOSCOPY WITH PROPOFOL;  Surgeon: Manya Silvas, MD;  Location[facility] ENDOSCOPY;  Service: Endoscopy;  Laterality: N/A;   Family History  Problem Relation Age of Onset  . Hypertension Mother   . Congestive Heart Failure Mother   . Colon cancer Brother   . Breast cancer Neg Hx    Social History   Socioeconomic History  . Marital status: Single    Spouse name: Not on file  . Number of children: Not on file  . Years of education: Not on file  . Highest education level: Not on file  Occupational History  . Not on file  Tobacco Use  . Smoking status: Never Smoker  . Smokeless tobacco: Never Used  Substance and Sexual Activity  . Alcohol use: No    Alcohol/week: 0.0 standard drinks  . Drug use: No  . Sexual activity: Not Currently    Birth control/protection: Post-menopausal  Other Topics Concern  . Not on file  Social History Narrative  . Not on file   Social Determinants of Health   Financial Resource Strain: Low Risk   . Difficulty of Paying Living Expenses: Not hard at all  Food Insecurity: No Food Insecurity  . Worried About Charity fundraiser in the Last Year: Never true  . Ran Out of Food in the Last Year: Never true  Transportation Needs: No Transportation Needs  . Lack of Transportation (Medical): No  . Lack of Transportation (Non-Medical): No  Physical Activity:   . Days of  Exercise per Week: Not on file  . Minutes of Exercise per Session: Not on file  Stress: No Stress Concern Present  . Feeling of Stress : Not at all  Social Connections: Unknown  . Frequency of Communication with Friends and Family: Not on file  . Frequency of Social Gatherings with Friends and Family: More than three times a week  . Attends Religious Services: More than 4 times per year  . Active Member of Clubs or Organizations: Yes  . Attends Archivist Meetings: Not on file  . Marital Status: Not on file    Tobacco Counseling Counseling given: Not Answered   Clinical Intake:  Pre-visit preparation completed: Yes        Diabetes: Yes (Followed by pcp)  How often do you need to have someone help you when you read instructions, pamphlets, or other written materials from your doctor or pharmacy?: 1 - Never  Interpreter Needed?: No      Activities of Daily Living In your present state of health, do you have any difficulty performing the following activities: 08/08/2020 04/25/2020  Hearing? N N  Vision? N N  Difficulty concentrating or making decisions? N N  Walking or climbing stairs? N N  Dressing or bathing? N N  Doing errands, shopping? N -  Preparing Food and eating ? N -  Using the Toilet? N -  In the past six months, have you accidently leaked urine? N -  Do you have problems with loss of bowel control? N -  Managing your Medications? N -  Managing your Finances? N -  Housekeeping or managing your Housekeeping? N -  Some recent data might be hidden    Patient Care Team: Einar Pheasant, MD as PCP - General (Internal Medicine)  Indicate any recent Medical Services you may have received from other than [facility] providers in the past year (date may be approximate).     Assessment:   This is a routine wellness examination for Sheri Cook.  I connected with Sheri Cook today by telephone and verified that I am speaking with the correct person using two  identifiers. 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    I discussed the limitations, risks, security and privacy concerns of performing an evaluation and management service by telephone and the availability of in person appointments. The patient expressed understanding and verbally consented to this telephonic visit.    Interactive audio and video telecommunications were attempted between this provider and patient, however failed, due to patient having technical difficulties OR patient did not have access to video capability.  We continued and completed visit with audio only.  Some vital signs may be absent or patient reported.   Hearing/Vision screen  Hearing Screening   125Hz  250Hz  500Hz  1000Hz  2000Hz  3000Hz  4000Hz  6000Hz  8000Hz   Right ear:           Left ear:  Comments: Patient is able to hear conversational tones without difficulty. No issues reported.   Vision Screening Comments: Wears corrective lenses  Cataract extraction, bilateral  Visual acuity not assessed, virtual visit.  No retinopathy  Glaucoma suspect; no drops in use. They have seen their ophthalmologist in the last 6 months.   Dietary issues and exercise activities discussed: Current Exercise Habits: Home exercise routine, Type of exercise: walking, Intensity: Mild  Goals      Patient Stated   .  I'd like to lose some weight (pt-stated)      I want to walk more for exercise Portion control      Depression Screen PHQ 2/9 Scores 08/08/2020 02/09/2020 08/08/2019 03/09/2019 08/02/2018 07/30/2017 08/11/2016  PHQ - 2 Score 0 0 0 0 0 0 0  PHQ- 9 Score - - - - - 0 -    Fall Risk Fall Risk  08/08/2020 02/09/2020 08/08/2019 08/02/2018 07/30/2017  Falls in the past year? 0 0 0 No No  Number falls in past yr: 0 0 - - -  Injury with Fall? - - - - -  Follow up Falls evaluation completed Falls evaluation completed - - -   Handrails in use when  climbing stairs? Yes Home free of loose throw rugs in walkways, pet beds, electrical cords, etc? Yes  Adequate lighting in your home to reduce risk of falls? Yes   ASSISTIVE DEVICES UTILIZED TO PREVENT FALLS: Use of a cane, walker or w/c? No   TIMED UP AND GO: Was the test performed? No . Virtual visit.   Cognitive Function: MMSE - Mini Mental State Exam 07/30/2017 07/28/2016 07/27/2015  Orientation to time 5 5 5   Orientation to Place 5 5 5   Registration 3 3 3   Attention/ Calculation 4 5 5   Attention/Calculation-comments Difficulty subtracting simple calculations - -  Recall 3 2 3   Language- name 2 objects 2 2 2   Language- repeat 1 1 1   Language- follow 3 step command 3 3 3   Language- read & follow direction 1 1 1   Write a sentence 1 1 1   Copy design 1 1 1   Total score 29 29 30      6CIT Screen 08/08/2020 08/08/2019 08/02/2018  What Year? 0 points 0 points 0 points  What month? 0 points 0 points 0 points  What time? - 0 points 0 points  Count back from 20 - 0 points 0 points  Months in reverse 0 points 0 points 0 points  Repeat phrase - - 0 points  Total Score - - 0    Immunizations Immunization History  Administered Date(s) Administered  . Fluad Quad(high Dose 65+) 08/08/2019, 08/03/2020  . Influenza, High Dose Seasonal PF 07/28/2016, 07/30/2017, 08/02/2018  . Influenza-Unspecified 08/08/2014, 07/13/2015  . Moderna SARS-COVID-2 Vaccination 01/26/2020, 02/16/2020  . Pneumococcal Conjugate-13 07/27/2015  . Pneumococcal Polysaccharide-23 12/15/2016  . Zoster Recombinat (Shingrix) 09/21/2019   TDAP status: Due, Education has been provided regarding the importance of this vaccine. Advised may receive this vaccine at local pharmacy or Health Dept. Aware to provide a copy of the vaccination record if obtained from local pharmacy or Health Dept. Verbalized acceptance and understanding. Deferred.   Health Maintenance Health Maintenance  Topic Date Due  . TETANUS/TDAP   08/08/2021 (Originally 04/22/1960)  . Hepatitis C Screening  08/08/2021 (Originally 1941/07/21)  . MAMMOGRAM  10/05/2020  . HEMOGLOBIN A1C  11/14/2020  . OPHTHALMOLOGY EXAM  05/10/2021  . FOOT EXAM  05/18/2021  . INFLUENZA VACCINE  Completed  .  DEXA SCAN  Completed  . COVID-19 Vaccine  Completed  . PNA vac Low Risk Adult  Completed   Dental Screening: Recommended annual dental exams for proper oral hygiene.   Hepatitis C Screening- deferred per patient.   Community Resource Referral / Chronic Care Management: CRR required this visit?  No   CCM required this visit?  No      Plan:   Keep all routine maintenance appointments.   Next scheduled fasting lab 09/24/20  Follow up 09/27/20  I have personally reviewed and noted the following in the patient's chart:   . Medical and social history . Use of alcohol, tobacco or illicit drugs  . Current medications and supplements . Functional ability and status . Nutritional status . Physical activity . Advanced directives . List of other physicians . Hospitalizations, surgeries, and ER visits in previous 12 months . Vitals . Screenings to include cognitive, depression, and falls . Referrals and appointments  In addition, I have reviewed and discussed with patient certain preventive protocols, quality metrics, and best practice recommendations. A written personalized care plan for preventive services as well as general preventive health recommendations were provided to patient via mail.     Varney Biles, LPN   [id number]      Reviewed above information.  Agree with assessment and plan.   Dr Nicki Reaper

## 2020-08-27 ENCOUNTER — Other Ambulatory Visit: Payer: Self-pay | Admitting: Internal Medicine

## 2020-09-24 ENCOUNTER — Other Ambulatory Visit: Payer: Self-pay

## 2020-09-24 ENCOUNTER — Other Ambulatory Visit (INDEPENDENT_AMBULATORY_CARE_PROVIDER_SITE_OTHER): Payer: Medicare Other

## 2020-09-24 DIAGNOSIS — E7800 Pure hypercholesterolemia, unspecified: Secondary | ICD-10-CM

## 2020-09-24 DIAGNOSIS — E119 Type 2 diabetes mellitus without complications: Secondary | ICD-10-CM

## 2020-09-24 DIAGNOSIS — I10 Essential (primary) hypertension: Secondary | ICD-10-CM | POA: Diagnosis not present

## 2020-09-24 LAB — BASIC METABOLIC PANEL
BUN: 25 mg/dL — ABNORMAL HIGH (ref 6–23)
CO2: 27 mEq/L (ref 19–32)
Calcium: 9.9 mg/dL (ref 8.4–10.5)
Chloride: 110 mEq/L (ref 96–112)
Creatinine, Ser: 1.13 mg/dL (ref 0.40–1.20)
GFR: 46.3 mL/min — ABNORMAL LOW (ref >60.00)
Glucose, Bld: 98 mg/dL (ref 70–99)
Potassium: 3.9 mEq/L (ref 3.5–5.1)
Sodium: 144 mEq/L (ref 135–145)

## 2020-09-24 LAB — LIPID PANEL
Cholesterol: 136 mg/dL (ref 0–200)
HDL: 37 mg/dL — ABNORMAL LOW (ref >39.00)
LDL Cholesterol: 77 mg/dL (ref 0–99)
NonHDL: 98.58
Total CHOL/HDL Ratio: 4
Triglycerides: 107 mg/dL (ref 0.0–149.0)
VLDL: 21.4 mg/dL (ref 0.0–40.0)

## 2020-09-24 LAB — CBC WITH DIFFERENTIAL/PLATELET
Basophils Absolute: 0 10*3/uL (ref 0.0–0.1)
Basophils Relative: 0.3 % (ref 0.0–3.0)
Eosinophils Absolute: 0.1 10*3/uL (ref 0.0–0.7)
Eosinophils Relative: 2.4 % (ref 0.0–5.0)
HCT: 37.7 % (ref 36.0–46.0)
Hemoglobin: 12.6 g/dL (ref 12.0–15.0)
Lymphocytes Relative: 36.4 % (ref 12.0–46.0)
Lymphs Abs: 1.3 10*3/uL (ref 0.7–4.0)
MCHC: 33.4 g/dL (ref 30.0–36.0)
MCV: 92.1 fl (ref 78.0–100.0)
Monocytes Absolute: 0.2 10*3/uL (ref 0.1–1.0)
Monocytes Relative: 6.1 % (ref 3.0–12.0)
Neutro Abs: 2 10*3/uL (ref 1.4–7.7)
Neutrophils Relative %: 54.8 % (ref 43.0–77.0)
Platelets: 260 10*3/uL (ref 150.0–400.0)
RBC: 4.09 Mil/uL (ref 3.87–5.11)
RDW: 15.1 % (ref 11.5–15.5)
WBC: 3.7 10*3/uL — ABNORMAL LOW (ref 4.0–10.5)

## 2020-09-24 LAB — HEPATIC FUNCTION PANEL
ALT: 29 U/L (ref 0–35)
AST: 22 U/L (ref 0–37)
Albumin: 4.2 g/dL (ref 3.5–5.2)
Alkaline Phosphatase: 50 U/L (ref 39–117)
Bilirubin, Direct: 0.1 mg/dL (ref 0.0–0.3)
Total Bilirubin: 0.4 mg/dL (ref 0.2–1.2)
Total Protein: 6.8 g/dL (ref 6.0–8.3)

## 2020-09-24 LAB — HEMOGLOBIN A1C: Hgb A1c MFr Bld: 6.5 % (ref 4.6–6.5)

## 2020-09-27 ENCOUNTER — Ambulatory Visit: Payer: Medicare Other | Admitting: Internal Medicine

## 2020-09-27 ENCOUNTER — Other Ambulatory Visit: Payer: Self-pay

## 2020-09-27 VITALS — BP 126/78 | HR 70 | Temp 98.1°F | Resp 16 | Ht 63.0 in | Wt 178.0 lb

## 2020-09-27 DIAGNOSIS — I10 Essential (primary) hypertension: Secondary | ICD-10-CM | POA: Diagnosis not present

## 2020-09-27 DIAGNOSIS — N1831 Chronic kidney disease, stage 3a: Secondary | ICD-10-CM | POA: Diagnosis not present

## 2020-09-27 DIAGNOSIS — E7800 Pure hypercholesterolemia, unspecified: Secondary | ICD-10-CM

## 2020-09-27 DIAGNOSIS — E119 Type 2 diabetes mellitus without complications: Secondary | ICD-10-CM

## 2020-09-27 LAB — HM DIABETES FOOT EXAM

## 2020-09-27 MED ORDER — LISINOPRIL-HYDROCHLOROTHIAZIDE 20-12.5 MG PO TABS: 1.0000 | ORAL_TABLET | Freq: Every day | ORAL | 2 refills | Status: DC

## 2020-09-27 MED ORDER — ROSUVASTATIN CALCIUM 10 MG PO TABS: 10.0000 mg | ORAL_TABLET | Freq: Every day | ORAL | 2 refills | Status: DC

## 2020-09-27 NOTE — Progress Notes (Signed)
[name], female   DOB: [birth year], 79 y.o.   MRN: [id number]   [name], female    DOB: 1940/12/02, 79 y.o.   MRN: [id number]  HPI This visit occurred during the SARS-CoV-2 public health emergency.  Safety protocols were in place, including screening questions prior to the visit, additional usage of staff PPE, and extensive cleaning of exam room while observing appropriate contact time as indicated for disinfecting solutions.  Patient here for a scheduled follow up.  Here to follow up regarding her blood pressure, sugar and cholesterol.  Doing well. Still working.  No chest pain or sob.  No acid reflux.  No abodminal pain.  Bowels moving.  Does not check her pressures.  Taking her medication.  Discussed labs.     Past Medical History:  Diagnosis Date  . Essential hypertension   . History of chicken pox   . Hypercholesterolemia   . Left breast mass    Past Surgical History:  Procedure Laterality Date  . BREAST BIOPSY Left 02/23/2020   Affirm Biopsy- X- Clip, path pending  . BREAST LUMPECTOMY WITH RADIOACTIVE SEED LOCALIZATION Left 04/25/2020   Procedure: LEFT BREAST LUMPECTOMY WITH RADIOACTIVE SEED LOCALIZATION;  Surgeon: Jovita Kussmaul, MD;  Location: [facility];  Service: General;  Laterality: Left;  . cataract surgery     bilateral  . COLONOSCOPY WITH PROPOFOL N/A 12/03/2016   Procedure: COLONOSCOPY WITH PROPOFOL;  Surgeon: Manya Silvas, MD;  Location: [facility] ENDOSCOPY;  Service: Endoscopy;  Laterality: N/A;   Family History  Problem Relation Age of Onset  . Hypertension Mother   . Congestive Heart Failure Mother   . Colon cancer Brother   . Breast cancer Neg Hx    Social History   Socioeconomic History  . Marital status: Single    Spouse name: Not on file  . Number of children: Not on file  . Years of education: Not on file  . Highest education level: Not on file  Occupational History  . Not on file    Tobacco Use  . Smoking status: Never Smoker  . Smokeless tobacco: Never Used  Substance and Sexual Activity  . Alcohol use: No    Alcohol/week: 0.0 standard drinks  . Drug use: No  . Sexual activity: Not Currently    Birth control/protection: Post-menopausal  Other Topics Concern  . Not on file  Social History Narrative  . Not on file   Social Determinants of Health   Financial Resource Strain: Low Risk   . Difficulty of Paying Living Expenses: Not hard at all  Food Insecurity: No Food Insecurity  . Worried About Charity fundraiser in the Last Year: Never true  . Ran Out of Food in the Last Year: Never true  Transportation Needs: No Transportation Needs  . Lack of Transportation (Medical): No  . Lack of Transportation (Non-Medical): No  Physical Activity:   . Days of Exercise per Week: Not on file  . Minutes of Exercise per Session: Not on file  Stress: No Stress Concern Present  . Feeling of Stress : Not at all  Social Connections: Unknown  . Frequency of Communication with Friends and Family: Not on file  . Frequency of Social Gatherings with Friends and Family: More than three times a week  . Attends Religious Services: More than 4 times per year  . Active Member of Clubs or Organizations: Yes  . Attends Archivist Meetings:  Not on file  . Marital Status: Not on file    Outpatient Encounter Medications as of 09/27/2020  Medication Sig  . lisinopril-hydrochlorothiazide (ZESTORETIC) 20-12.5 MG tablet Take 1 tablet by mouth daily.  . rosuvastatin (CRESTOR) 10 MG tablet Take 1 tablet (10 mg total) by mouth daily.  . [DISCONTINUED] lisinopril-hydrochlorothiazide (ZESTORETIC) 20-12.5 MG tablet Take 1 tablet by mouth daily.  . [DISCONTINUED] rosuvastatin (CRESTOR) 10 MG tablet Take 1 tablet by mouth once daily  . [DISCONTINUED] HYDROcodone-acetaminophen (NORCO/VICODIN) 5-325 MG tablet Take 1-2 tablets by mouth every 6 (six) hours as needed for moderate pain or  severe pain.   No facility-administered encounter medications on file as of 09/27/2020.    Review of Systems  Constitutional: Negative for appetite change and unexpected weight change.  HENT: Negative for congestion and sinus pressure.   Respiratory: Negative for cough, chest tightness and shortness of breath.   Cardiovascular: Negative for chest pain, palpitations and leg swelling.  Gastrointestinal: Negative for abdominal pain, diarrhea, nausea and vomiting.  Genitourinary: Negative for difficulty urinating and dysuria.  Musculoskeletal: Negative for joint swelling and myalgias.  Skin: Negative for color change and rash.  Neurological: Negative for dizziness, light-headedness and headaches.  Psychiatric/Behavioral: Negative for agitation and dysphoric mood.       Objective:    Physical Exam Vitals reviewed.  Constitutional:      General: She is not in acute distress.    Appearance: Normal appearance.  HENT:     Head: Normocephalic and atraumatic.     Right Ear: External ear normal.     Left Ear: External ear normal.  Eyes:     General: No scleral icterus.       Right eye: No discharge.        Left eye: No discharge.     Conjunctiva/sclera: Conjunctivae normal.  Neck:     Thyroid: No thyromegaly.  Cardiovascular:     Rate and Rhythm: Normal rate and regular rhythm.  Pulmonary:     Effort: No respiratory distress.     Breath sounds: Normal breath sounds. No wheezing.  Abdominal:     General: Bowel sounds are normal.     Palpations: Abdomen is soft.     Tenderness: There is no abdominal tenderness.  Musculoskeletal:        General: No swelling or tenderness.     Cervical back: Neck supple. No tenderness.  Lymphadenopathy:     Cervical: No cervical adenopathy.  Skin:    Findings: No erythema or rash.  Neurological:     Mental Status: She is alert.  Psychiatric:        Mood and Affect: Mood normal.        Behavior: Behavior normal.     BP 126/78   Pulse 70    Temp 98.1 F (36.7 C) (Oral)   Resp 16   Ht 5' 3"  (1.6 m)   Wt 178 lb (80.7 kg)   SpO2 98%   BMI 31.53 kg/m  Wt Readings from Last 3 Encounters:  09/27/20 178 lb (80.7 kg)  08/08/20 183 lb (83 kg)  05/18/20 183 lb (83 kg)     Lab Results  Component Value Date   WBC 3.7 (L) 09/24/2020   HGB 12.6 09/24/2020   HCT 37.7 09/24/2020   PLT 260.0 09/24/2020   GLUCOSE 98 09/24/2020   CHOL 136 09/24/2020   TRIG 107.0 09/24/2020   HDL 37.00 (L) 09/24/2020   LDLCALC 77 09/24/2020   ALT 29 09/24/2020  AST 22 09/24/2020   NA 144 09/24/2020   K 3.9 09/24/2020   CL 110 09/24/2020   CREATININE 1.13 09/24/2020   BUN 25 (H) 09/24/2020   CO2 27 09/24/2020   TSH 2.29 12/20/2019   HGBA1C 6.5 09/24/2020   MICROALBUR 2.6 (H) 12/20/2019    MM Breast Surgical Specimen  Result Date: 04/25/2020 CLINICAL DATA:  Excisional biopsy a high risk complex sclerosing lesion and sclerosing intraductal papilloma from the LEFT breast. Radioactive seed localization was performed yesterday. EXAM: SPECIMEN RADIOGRAPH OF THE LEFT BREAST COMPARISON:  Previous exam(s). FINDINGS: Status post excision of the LEFT breast. The radioactive seed and X shaped tissue marker clip are present in the specimen. This was discussed directly with the operating room nurse at the time of interpretation on 04/25/2020 at 8:45 a.m. IMPRESSION: Specimen radiograph of the LEFT breast. Electronically Signed   By: Evangeline Dakin M.D.   On: 04/25/2020 08:47       Assessment & Plan:   Problem List Items Addressed This Visit    Hypertension, essential    Blood pressure doing well.  Continue lisinopril/hctz.  Follow pressures.  Follow metabolic panel.        Relevant Medications   lisinopril-hydrochlorothiazide (ZESTORETIC) 20-12.5 MG tablet   rosuvastatin (CRESTOR) 10 MG tablet   Hypercholesterolemia    On crestor.  Low cholesterol diet and exercise.  Follow lipid panel and liver function tests.        Relevant Medications    lisinopril-hydrochlorothiazide (ZESTORETIC) 20-12.5 MG tablet   rosuvastatin (CRESTOR) 10 MG tablet   Diabetes mellitus without complication (HCC)    Low carb diet and exercise.  Follow met b and a1c.       Relevant Medications   lisinopril-hydrochlorothiazide (ZESTORETIC) 20-12.5 MG tablet   rosuvastatin (CRESTOR) 10 MG tablet   CKD (chronic kidney disease) stage 3, GFR 30-59 ml/min (HCC) - Primary    Continue to avoid antiinflammatories.  Follow pressures.  Follow metabolic panel.        Relevant Orders   Basic metabolic panel       Einar Pheasant, MD

## 2020-09-30 ENCOUNTER — Encounter: Payer: Self-pay | Admitting: Internal Medicine

## 2020-09-30 NOTE — Assessment & Plan Note (Signed)
On crestor.  Low cholesterol diet and exercise.  Follow lipid panel and liver function tests.   

## 2020-09-30 NOTE — Assessment & Plan Note (Signed)
Low carb diet and exercise.  Follow met b and a1c.  

## 2020-09-30 NOTE — Assessment & Plan Note (Signed)
Continue to avoid antiinflammatories.  Follow pressures.  Follow metabolic panel.

## 2020-09-30 NOTE — Assessment & Plan Note (Signed)
Blood pressure doing well.  Continue lisinopril/hctz.  Follow pressures.  Follow metabolic panel.  

## 2020-10-29 ENCOUNTER — Other Ambulatory Visit (INDEPENDENT_AMBULATORY_CARE_PROVIDER_SITE_OTHER): Payer: Medicare Other

## 2020-10-29 ENCOUNTER — Other Ambulatory Visit: Payer: Self-pay

## 2020-10-29 DIAGNOSIS — N1831 Chronic kidney disease, stage 3a: Secondary | ICD-10-CM

## 2020-10-29 LAB — BASIC METABOLIC PANEL
BUN: 31 mg/dL — ABNORMAL HIGH (ref 6–23)
CO2: 28 mEq/L (ref 19–32)
Calcium: 10 mg/dL (ref 8.4–10.5)
Chloride: 109 mEq/L (ref 96–112)
Creatinine, Ser: 1.23 mg/dL — ABNORMAL HIGH (ref 0.40–1.20)
GFR: 41.79 mL/min — ABNORMAL LOW (ref >60.00)
Glucose, Bld: 151 mg/dL — ABNORMAL HIGH (ref 70–99)
Potassium: 3.9 mEq/L (ref 3.5–5.1)
Sodium: 144 mEq/L (ref 135–145)

## 2020-10-31 ENCOUNTER — Ambulatory Visit: Payer: Medicare Other | Admitting: Internal Medicine

## 2020-10-31 ENCOUNTER — Other Ambulatory Visit: Payer: Self-pay

## 2020-10-31 ENCOUNTER — Encounter: Payer: Self-pay | Admitting: Internal Medicine

## 2020-10-31 VITALS — BP 128/70 | HR 67 | Temp 97.7°F | Resp 16 | Ht 63.0 in | Wt 176.2 lb

## 2020-10-31 DIAGNOSIS — E119 Type 2 diabetes mellitus without complications: Secondary | ICD-10-CM

## 2020-10-31 DIAGNOSIS — E7800 Pure hypercholesterolemia, unspecified: Secondary | ICD-10-CM | POA: Diagnosis not present

## 2020-10-31 DIAGNOSIS — N1831 Chronic kidney disease, stage 3a: Secondary | ICD-10-CM | POA: Diagnosis not present

## 2020-10-31 DIAGNOSIS — I10 Essential (primary) hypertension: Secondary | ICD-10-CM

## 2020-10-31 MED ORDER — LISINOPRIL 20 MG PO TABS
20.0000 mg | ORAL_TABLET | Freq: Every day | ORAL | 2 refills | Status: DC
Start: 2020-10-31 — End: 2021-01-29

## 2020-10-31 MED ORDER — AMLODIPINE BESYLATE 5 MG PO TABS
5.0000 mg | ORAL_TABLET | Freq: Every day | ORAL | 2 refills | Status: DC
Start: 2020-10-31 — End: 2021-01-29

## 2020-10-31 NOTE — Patient Instructions (Signed)
Stop lisinopril/hctz  Start lisinopril 20mg  - take one tablet per day  Start amlodipine 5mg  - take one tablet per day

## 2020-10-31 NOTE — Progress Notes (Signed)
[name], female   DOB: 10-19-1941, 80 y.o.   MRN: [id number]   [name], female    DOB: 11/13/1940, 80 y.o.   MRN: [id number]  HPI This visit occurred during the SARS-CoV-2 public health emergency.  Safety protocols were in place, including screening questions prior to the visit, additional usage of staff PPE, and extensive cleaning of exam room while observing appropriate contact time as indicated for disinfecting solutions.  Patient here for a scheduled follow up. Here to follow up regarding her blood pressure and recent decreased GFR.  Recent lab 10/29/20 - GFR - 41.79. on lisinopril/hctz.  Blood pressure has been better.  Continued decrease GFR.  BUN elevated - 31 with creatinine 1.2.  Discussed importance of staying hydrated.  Also discussed changing medication and stopping hctz. No chest pain or sob.  No abdominal pain or bowel change.  No nausea or vomiting or diarrhea.  Overall feels good.    Past Medical History:  Diagnosis Date  . Essential hypertension   . History of chicken pox   . Hypercholesterolemia   . Left breast mass    Past Surgical History:  Procedure Laterality Date  . BREAST BIOPSY Left 02/23/2020   Affirm Biopsy- X- Clip, path pending  . BREAST LUMPECTOMY WITH RADIOACTIVE SEED LOCALIZATION Left 04/25/2020   Procedure: LEFT BREAST LUMPECTOMY WITH RADIOACTIVE SEED LOCALIZATION;  Surgeon: Jovita Kussmaul, MD;  Location: [place];  Service: General;  Laterality: Left;  . cataract surgery     bilateral  . COLONOSCOPY WITH PROPOFOL N/A 12/03/2016   Procedure: COLONOSCOPY WITH PROPOFOL;  Surgeon: Manya Silvas, MD;  Location: [facility] ENDOSCOPY;  Service: Endoscopy;  Laterality: N/A;   Family History  Problem Relation Age of Onset  . Hypertension Mother   . Congestive Heart Failure Mother   . Colon cancer Brother   . Breast cancer Neg Hx    Social History   Socioeconomic History  . Marital status:  Single    Spouse name: Not on file  . Number of children: Not on file  . Years of education: Not on file  . Highest education level: Not on file  Occupational History  . Not on file  Tobacco Use  . Smoking status: Never Smoker  . Smokeless tobacco: Never Used  Substance and Sexual Activity  . Alcohol use: No    Alcohol/week: 0.0 standard drinks  . Drug use: No  . Sexual activity: Not Currently    Birth control/protection: Post-menopausal  Other Topics Concern  . Not on file  Social History Narrative  . Not on file   Social Determinants of Health   Financial Resource Strain: Low Risk   . Difficulty of Paying Living Expenses: Not hard at all  Food Insecurity: No Food Insecurity  . Worried About Charity fundraiser in the Last Year: Never true  . Ran Out of Food in the Last Year: Never true  Transportation Needs: No Transportation Needs  . Lack of Transportation (Medical): No  . Lack of Transportation (Non-Medical): No  Physical Activity: Not on file  Stress: No Stress Concern Present  . Feeling of Stress : Not at all  Social Connections: Unknown  . Frequency of Communication with Friends and Family: Not on file  . Frequency of Social Gatherings with Friends and Family: More than three times a week  . Attends Religious Services: More than 4 times per year  . Active Member of Clubs  or Organizations: Yes  . Attends Archivist Meetings: Not on file  . Marital Status: Not on file    Outpatient Encounter Medications as of 10/31/2020  Medication Sig  . amLODipine (NORVASC) 5 MG tablet Take 1 tablet (5 mg total) by mouth daily.  Marland Kitchen lisinopril (ZESTRIL) 20 MG tablet Take 1 tablet (20 mg total) by mouth daily.  . rosuvastatin (CRESTOR) 10 MG tablet Take 1 tablet (10 mg total) by mouth daily.  . [DISCONTINUED] lisinopril-hydrochlorothiazide (ZESTORETIC) 20-12.5 MG tablet Take 1 tablet by mouth daily.   No facility-administered encounter medications on file as of 10/31/2020.     Review of Systems  Constitutional: Negative for appetite change and unexpected weight change.  HENT: Negative for congestion and sinus pressure.   Respiratory: Negative for cough, chest tightness and shortness of breath.   Cardiovascular: Negative for chest pain, palpitations and leg swelling.  Gastrointestinal: Negative for abdominal pain, diarrhea, nausea and vomiting.  Genitourinary: Negative for difficulty urinating and dysuria.  Musculoskeletal: Negative for joint swelling and myalgias.  Skin: Negative for color change and rash.  Neurological: Negative for dizziness, light-headedness and headaches.  Psychiatric/Behavioral: Negative for agitation and dysphoric mood.       Objective:    Physical Exam Vitals reviewed.  Constitutional:      General: She is not in acute distress.    Appearance: Normal appearance.  HENT:     Head: Normocephalic and atraumatic.     Right Ear: External ear normal.     Left Ear: External ear normal.     Mouth/Throat:     Mouth: Oropharynx is clear and moist.  Eyes:     General: No scleral icterus.       Right eye: No discharge.        Left eye: No discharge.     Conjunctiva/sclera: Conjunctivae normal.  Neck:     Thyroid: No thyromegaly.  Cardiovascular:     Rate and Rhythm: Normal rate and regular rhythm.  Pulmonary:     Effort: No respiratory distress.     Breath sounds: Normal breath sounds. No wheezing.  Abdominal:     General: Bowel sounds are normal.     Palpations: Abdomen is soft.     Tenderness: There is no abdominal tenderness.  Musculoskeletal:        General: No swelling, tenderness or edema.     Cervical back: Neck supple. No tenderness.  Lymphadenopathy:     Cervical: No cervical adenopathy.  Skin:    Findings: No erythema or rash.  Neurological:     Mental Status: She is alert.  Psychiatric:        Mood and Affect: Mood normal.        Behavior: Behavior normal.     BP 128/70   Pulse 67   Temp 97.7 F (36.5  C) (Oral)   Resp 16   Ht 5' 3"  (1.6 m)   Wt 176 lb 3.2 oz (79.9 kg)   SpO2 97%   BMI 31.21 kg/m  Wt Readings from Last 3 Encounters:  10/31/20 176 lb 3.2 oz (79.9 kg)  09/27/20 178 lb (80.7 kg)  08/08/20 183 lb (83 kg)     Lab Results  Component Value Date   WBC 3.7 (L) 09/24/2020   HGB 12.6 09/24/2020   HCT 37.7 09/24/2020   PLT 260.0 09/24/2020   GLUCOSE 151 (H) 10/29/2020   CHOL 136 09/24/2020   TRIG 107.0 09/24/2020   HDL 37.00 (L) 09/24/2020  LDLCALC 77 09/24/2020   ALT 29 09/24/2020   AST 22 09/24/2020   NA 144 10/29/2020   K 3.9 10/29/2020   CL 109 10/29/2020   CREATININE 1.23 (H) 10/29/2020   BUN 31 (H) 10/29/2020   CO2 28 10/29/2020   TSH 2.29 12/20/2019   HGBA1C 6.5 09/24/2020   MICROALBUR 2.6 (H) 12/20/2019    MM Breast Surgical Specimen  Result Date: 04/25/2020 CLINICAL DATA:  Excisional biopsy a high risk complex sclerosing lesion and sclerosing intraductal papilloma from the LEFT breast. Radioactive seed localization was performed yesterday. EXAM: SPECIMEN RADIOGRAPH OF THE LEFT BREAST COMPARISON:  Previous exam(s). FINDINGS: Status post excision of the LEFT breast. The radioactive seed and X shaped tissue marker clip are present in the specimen. This was discussed directly with the operating room nurse at the time of interpretation on 04/25/2020 at 8:45 a.m. IMPRESSION: Specimen radiograph of the LEFT breast. Electronically Signed   By: Evangeline Dakin M.D.   On: 04/25/2020 08:47       Assessment & Plan:   Problem List Items Addressed This Visit    Hypertension, essential - Primary    Blood pressure doing well on current regimen.  Increased BUN and decreased GFR.  Discussed possible etiologies.  Discussed obtaining renal ultrasound.  Discussed adjusting medication.  She prefers to adjust medication.  Will change lisinopril/hcctz to lisinopril q day.  Add amlodipine 64m q day.  Follow metabolic panel.  Schedule for recheck in the next few weeks.         Relevant Medications   lisinopril (ZESTRIL) 20 MG tablet   amLODipine (NORVASC) 5 MG tablet   Other Relevant Orders   Basic metabolic panel   Hypercholesterolemia    Continue crestor.        Relevant Medications   lisinopril (ZESTRIL) 20 MG tablet   amLODipine (NORVASC) 5 MG tablet   CKD (chronic kidney disease) stage 3, GFR 30-59 ml/min (HCC)    Worsening GFR recently.  Elevated BUN.  Discussed possible etiologies.  Adjust medication as outlined.  Change lisinopril/hctz to lisinopril 22mq day.  Since stopping hctz, will add amlodipine 52m852m day.  Schedule for met b recheck.  Follow pressures.  Discussed renal ultrasound.  Wants to adjust medication first.  If persistent decrease, will obtain.        Diabetes mellitus without complication (HCC)    Low carb diet and exercise.  Follow met b and a1c.        Relevant Medications   lisinopril (ZESTRIL) 20 MG tablet       ChaEinar PheasantD

## 2020-11-05 ENCOUNTER — Encounter: Payer: Self-pay | Admitting: Internal Medicine

## 2020-11-05 NOTE — Assessment & Plan Note (Signed)
Continue crestor 

## 2020-11-05 NOTE — Assessment & Plan Note (Signed)
Blood pressure doing well on current regimen.  Increased BUN and decreased GFR.  Discussed possible etiologies.  Discussed obtaining renal ultrasound.  Discussed adjusting medication.  She prefers to adjust medication.  Will change lisinopril/hcctz to lisinopril q day.  Add amlodipine 5mg  q day.  Follow metabolic panel.  Schedule for recheck in the next few weeks.

## 2020-11-05 NOTE — Assessment & Plan Note (Signed)
Low carb diet and exercise.  Follow met b and a1c.   

## 2020-11-05 NOTE — Assessment & Plan Note (Signed)
Worsening GFR recently.  Elevated BUN.  Discussed possible etiologies.  Adjust medication as outlined.  Change lisinopril/hctz to lisinopril 28m q day.  Since stopping hctz, will add amlodipine 529mq day.  Schedule for met b recheck.  Follow pressures.  Discussed renal ultrasound.  Wants to adjust medication first.  If persistent decrease, will obtain.

## 2020-11-20 ENCOUNTER — Other Ambulatory Visit (INDEPENDENT_AMBULATORY_CARE_PROVIDER_SITE_OTHER): Payer: Medicare Other

## 2020-11-20 ENCOUNTER — Other Ambulatory Visit: Payer: Self-pay

## 2020-11-20 DIAGNOSIS — I10 Essential (primary) hypertension: Secondary | ICD-10-CM | POA: Diagnosis not present

## 2020-11-20 LAB — BASIC METABOLIC PANEL
BUN: 20 mg/dL (ref 6–23)
CO2: 24 mEq/L (ref 19–32)
Calcium: 9.8 mg/dL (ref 8.4–10.5)
Chloride: 109 mEq/L (ref 96–112)
Creatinine, Ser: 1.06 mg/dL (ref 0.40–1.20)
GFR: 49.94 mL/min — ABNORMAL LOW (ref >60.00)
Glucose, Bld: 95 mg/dL (ref 70–99)
Potassium: 4.4 mEq/L (ref 3.5–5.1)
Sodium: 139 mEq/L (ref 135–145)

## 2020-12-05 ENCOUNTER — Other Ambulatory Visit: Payer: Self-pay

## 2020-12-05 ENCOUNTER — Ambulatory Visit (INDEPENDENT_AMBULATORY_CARE_PROVIDER_SITE_OTHER): Payer: Medicare Other | Admitting: Internal Medicine

## 2020-12-05 DIAGNOSIS — E7800 Pure hypercholesterolemia, unspecified: Secondary | ICD-10-CM

## 2020-12-05 DIAGNOSIS — I10 Essential (primary) hypertension: Secondary | ICD-10-CM

## 2020-12-05 DIAGNOSIS — N1831 Chronic kidney disease, stage 3a: Secondary | ICD-10-CM | POA: Diagnosis not present

## 2020-12-05 DIAGNOSIS — E119 Type 2 diabetes mellitus without complications: Secondary | ICD-10-CM | POA: Diagnosis not present

## 2020-12-05 NOTE — Progress Notes (Signed)
[name], female   DOB: [birth year], 80 y.o.   MRN: [id number]   [name], female    DOB: [birth year], 80 y.o.   MRN: [id number]  HPI This visit occurred during the SARS-CoV-2 public health emergency.  Safety protocols were in place, including screening questions prior to the visit, additional usage of staff PPE, and extensive cleaning of exam room while observing appropriate contact time as indicated for disinfecting solutions.  Patient here for a scheduled follow up. Here to follow up regarding her blood pressure.  We stopped hctz last visit.  Started her on amlodipine.  Cramps better off hctz.  Tolerating the medication.  Was seen at acute care 11/08/20 with cough and congestion.  Took omnicef and tessalon perles.  Cough better.  No chest pain or sob.  No acid reflux.  No abdominal pain.  Bowels moving.    Past Medical History:  Diagnosis Date  . Essential hypertension   . History of chicken pox   . Hypercholesterolemia   . Left breast mass    Past Surgical History:  Procedure Laterality Date  . BREAST BIOPSY Left 02/23/2020   Affirm Biopsy- X- Clip, path pending  . BREAST LUMPECTOMY WITH RADIOACTIVE SEED LOCALIZATION Left 04/25/2020   Procedure: LEFT BREAST LUMPECTOMY WITH RADIOACTIVE SEED LOCALIZATION;  Surgeon: Jovita Kussmaul, MD;  Location: [place];  Service: General;  Laterality: Left;  . cataract surgery     bilateral  . COLONOSCOPY WITH PROPOFOL N/A 12/03/2016   Procedure: COLONOSCOPY WITH PROPOFOL;  Surgeon: Manya Silvas, MD;  Location: [facility] ENDOSCOPY;  Service: Endoscopy;  Laterality: N/A;   Family History  Problem Relation Age of Onset  . Hypertension Mother   . Congestive Heart Failure Mother   . Colon cancer Brother   . Breast cancer Neg Hx    Social History   Socioeconomic History  . Marital status: Single    Spouse name: Not on file  . Number of children: Not on file  . Years of education: Not  on file  . Highest education level: Not on file  Occupational History  . Not on file  Tobacco Use  . Smoking status: Never Smoker  . Smokeless tobacco: Never Used  Substance and Sexual Activity  . Alcohol use: No    Alcohol/week: 0.0 standard drinks  . Drug use: No  . Sexual activity: Not Currently    Birth control/protection: Post-menopausal  Other Topics Concern  . Not on file  Social History Narrative  . Not on file   Social Determinants of Health   Financial Resource Strain: Low Risk   . Difficulty of Paying Living Expenses: Not hard at all  Food Insecurity: No Food Insecurity  . Worried About Charity fundraiser in the Last Year: Never true  . Ran Out of Food in the Last Year: Never true  Transportation Needs: No Transportation Needs  . Lack of Transportation (Medical): No  . Lack of Transportation (Non-Medical): No  Physical Activity: Not on file  Stress: No Stress Concern Present  . Feeling of Stress : Not at all  Social Connections: Unknown  . Frequency of Communication with Friends and Family: Not on file  . Frequency of Social Gatherings with Friends and Family: More than three times a week  . Attends Religious Services: More than 4 times per year  . Active Member of Clubs or Organizations: Yes  . Attends Archivist Meetings: Not on  file  . Marital Status: Not on file    Outpatient Encounter Medications as of 12/05/2020  Medication Sig  . amLODipine (NORVASC) 5 MG tablet Take 1 tablet (5 mg total) by mouth daily.  Marland Kitchen lisinopril (ZESTRIL) 20 MG tablet Take 1 tablet (20 mg total) by mouth daily.  . rosuvastatin (CRESTOR) 10 MG tablet Take 1 tablet (10 mg total) by mouth daily.   No facility-administered encounter medications on file as of 12/05/2020.    Review of Systems  Constitutional: Negative for appetite change and unexpected weight change.  HENT: Negative for congestion and sinus pressure.   Respiratory: Negative for cough, chest tightness and  shortness of breath.   Cardiovascular: Negative for chest pain, palpitations and leg swelling.  Gastrointestinal: Negative for abdominal pain, diarrhea, nausea and vomiting.  Genitourinary: Negative for difficulty urinating and dysuria.  Musculoskeletal: Negative for joint swelling and myalgias.  Skin: Negative for color change and rash.  Neurological: Negative for dizziness, light-headedness and headaches.  Psychiatric/Behavioral: Negative for agitation and dysphoric mood.       Objective:    Physical Exam Vitals reviewed.  Constitutional:      General: She is not in acute distress.    Appearance: Normal appearance.  HENT:     Head: Normocephalic and atraumatic.     Right Ear: External ear normal.     Left Ear: External ear normal.     Mouth/Throat:     Mouth: Oropharynx is clear and moist.  Eyes:     General: No scleral icterus.       Right eye: No discharge.        Left eye: No discharge.     Conjunctiva/sclera: Conjunctivae normal.  Neck:     Thyroid: No thyromegaly.  Cardiovascular:     Rate and Rhythm: Normal rate and regular rhythm.  Pulmonary:     Effort: No respiratory distress.     Breath sounds: Normal breath sounds. No wheezing.  Abdominal:     General: Bowel sounds are normal.     Palpations: Abdomen is soft.     Tenderness: There is no abdominal tenderness.  Musculoskeletal:        General: No swelling, tenderness or edema.     Cervical back: Neck supple. No tenderness.  Lymphadenopathy:     Cervical: No cervical adenopathy.  Skin:    Findings: No erythema or rash.  Neurological:     Mental Status: She is alert.  Psychiatric:        Mood and Affect: Mood normal.        Behavior: Behavior normal.     BP 138/70   Pulse 61   Temp 98.2 F (36.8 C) (Oral)   Resp 16   Ht 5' 3"  (1.6 m)   Wt 175 lb (79.4 kg)   SpO2 97%   BMI 31.00 kg/m  Wt Readings from Last 3 Encounters:  12/05/20 175 lb (79.4 kg)  10/31/20 176 lb 3.2 oz (79.9 kg)  09/27/20  178 lb (80.7 kg)     Lab Results  Component Value Date   WBC 3.7 (L) 09/24/2020   HGB 12.6 09/24/2020   HCT 37.7 09/24/2020   PLT 260.0 09/24/2020   GLUCOSE 95 11/20/2020   CHOL 136 09/24/2020   TRIG 107.0 09/24/2020   HDL 37.00 (L) 09/24/2020   LDLCALC 77 09/24/2020   ALT 29 09/24/2020   AST 22 09/24/2020   NA 139 11/20/2020   K 4.4 11/20/2020   CL 109 11/20/2020  CREATININE 1.06 11/20/2020   BUN 20 11/20/2020   CO2 24 11/20/2020   TSH 2.29 12/20/2019   HGBA1C 6.5 09/24/2020   MICROALBUR 2.6 (H) 12/20/2019    MM Breast Surgical Specimen  Result Date: 04/25/2020 CLINICAL DATA:  Excisional biopsy a high risk complex sclerosing lesion and sclerosing intraductal papilloma from the LEFT breast. Radioactive seed localization was performed yesterday. EXAM: SPECIMEN RADIOGRAPH OF THE LEFT BREAST COMPARISON:  Previous exam(s). FINDINGS: Status post excision of the LEFT breast. The radioactive seed and X shaped tissue marker clip are present in the specimen. This was discussed directly with the operating room nurse at the time of interpretation on 04/25/2020 at 8:45 a.m. IMPRESSION: Specimen radiograph of the LEFT breast. Electronically Signed   By: Evangeline Dakin M.D.   On: 04/25/2020 08:47       Assessment & Plan:   Problem List Items Addressed This Visit    CKD (chronic kidney disease) stage 3, GFR 30-59 ml/min (HCC)    GFR improved with stopping hctz.  Continue lisinopril and amlodipine.  Follow pressures.  Follow metabolic panel.       Diabetes mellitus without complication (HCC)    Low carb diet and exercise.  Follow met b and a1c.        Hypercholesterolemia    Continue crestor.  Low cholesterol diet and exercise.  Follow lipid panel and liver function tests.       Hypertension, essential    Blood pressure doing well.  Cramps resolved off hctz.  Continue amlodipine and lisinopril.  Follow pressures.  Follow metabolic panel.           Einar Pheasant, MD

## 2020-12-09 ENCOUNTER — Encounter: Payer: Self-pay | Admitting: Internal Medicine

## 2020-12-09 NOTE — Assessment & Plan Note (Signed)
Low carb diet and exercise.  Follow met b and a1c.   

## 2020-12-09 NOTE — Assessment & Plan Note (Signed)
Continue crestor.  Low cholesterol diet and exercise. Follow lipid panel and liver function tests.   

## 2020-12-09 NOTE — Assessment & Plan Note (Signed)
Blood pressure doing well.  Cramps resolved off hctz.  Continue amlodipine and lisinopril.  Follow pressures.  Follow metabolic panel.

## 2020-12-09 NOTE — Assessment & Plan Note (Signed)
GFR improved with stopping hctz.  Continue lisinopril and amlodipine.  Follow pressures.  Follow metabolic panel.

## 2021-01-22 ENCOUNTER — Telehealth: Payer: Self-pay | Admitting: *Deleted

## 2021-01-22 DIAGNOSIS — I10 Essential (primary) hypertension: Secondary | ICD-10-CM

## 2021-01-22 DIAGNOSIS — E119 Type 2 diabetes mellitus without complications: Secondary | ICD-10-CM

## 2021-01-22 DIAGNOSIS — E7800 Pure hypercholesterolemia, unspecified: Secondary | ICD-10-CM

## 2021-01-22 NOTE — Telephone Encounter (Signed)
Please place future orders for lab appt.  

## 2021-01-22 NOTE — Telephone Encounter (Signed)
Order placed for f/u labs.  

## 2021-01-24 ENCOUNTER — Other Ambulatory Visit (INDEPENDENT_AMBULATORY_CARE_PROVIDER_SITE_OTHER): Payer: Medicare Other

## 2021-01-24 ENCOUNTER — Other Ambulatory Visit: Payer: Self-pay

## 2021-01-24 DIAGNOSIS — I10 Essential (primary) hypertension: Secondary | ICD-10-CM | POA: Diagnosis not present

## 2021-01-24 DIAGNOSIS — E119 Type 2 diabetes mellitus without complications: Secondary | ICD-10-CM

## 2021-01-24 DIAGNOSIS — E7800 Pure hypercholesterolemia, unspecified: Secondary | ICD-10-CM | POA: Diagnosis not present

## 2021-01-24 LAB — CBC WITH DIFFERENTIAL/PLATELET
Basophils Absolute: 0 10*3/uL (ref 0.0–0.1)
Basophils Relative: 0.3 % (ref 0.0–3.0)
Eosinophils Absolute: 0.1 10*3/uL (ref 0.0–0.7)
Eosinophils Relative: 2.5 % (ref 0.0–5.0)
HCT: 37 % (ref 36.0–46.0)
Hemoglobin: 12.3 g/dL (ref 12.0–15.0)
Lymphocytes Relative: 32.9 % (ref 12.0–46.0)
Lymphs Abs: 1.2 10*3/uL (ref 0.7–4.0)
MCHC: 33.3 g/dL (ref 30.0–36.0)
MCV: 91.8 fl (ref 78.0–100.0)
Monocytes Absolute: 0.3 10*3/uL (ref 0.1–1.0)
Monocytes Relative: 7.3 % (ref 3.0–12.0)
Neutro Abs: 2.1 10*3/uL (ref 1.4–7.7)
Neutrophils Relative %: 57 % (ref 43.0–77.0)
Platelets: 274 10*3/uL (ref 150.0–400.0)
RBC: 4.03 Mil/uL (ref 3.87–5.11)
RDW: 15.1 % (ref 11.5–15.5)
WBC: 3.7 10*3/uL — ABNORMAL LOW (ref 4.0–10.5)

## 2021-01-24 LAB — BASIC METABOLIC PANEL
BUN: 25 mg/dL — ABNORMAL HIGH (ref 6–23)
CO2: 26 mEq/L (ref 19–32)
Calcium: 9.7 mg/dL (ref 8.4–10.5)
Chloride: 110 mEq/L (ref 96–112)
Creatinine, Ser: 1.08 mg/dL (ref 0.40–1.20)
GFR: 48.77 mL/min — ABNORMAL LOW (ref >60.00)
Glucose, Bld: 96 mg/dL (ref 70–99)
Potassium: 3.9 mEq/L (ref 3.5–5.1)
Sodium: 143 mEq/L (ref 135–145)

## 2021-01-24 LAB — LIPID PANEL
Cholesterol: 131 mg/dL (ref 0–200)
HDL: 44.2 mg/dL (ref >39.00)
LDL Cholesterol: 73 mg/dL (ref 0–99)
NonHDL: 87.27
Total CHOL/HDL Ratio: 3
Triglycerides: 72 mg/dL (ref 0.0–149.0)
VLDL: 14.4 mg/dL (ref 0.0–40.0)

## 2021-01-24 LAB — HEPATIC FUNCTION PANEL
ALT: 26 U/L (ref 0–35)
AST: 20 U/L (ref 0–37)
Albumin: 4.1 g/dL (ref 3.5–5.2)
Alkaline Phosphatase: 62 U/L (ref 39–117)
Bilirubin, Direct: 0.1 mg/dL (ref 0.0–0.3)
Total Bilirubin: 0.4 mg/dL (ref 0.2–1.2)
Total Protein: 6.5 g/dL (ref 6.0–8.3)

## 2021-01-24 LAB — HEMOGLOBIN A1C: Hgb A1c MFr Bld: 6.5 % (ref 4.6–6.5)

## 2021-01-24 LAB — TSH: TSH: 2 u[IU]/mL (ref 0.35–4.50)

## 2021-01-29 ENCOUNTER — Ambulatory Visit (INDEPENDENT_AMBULATORY_CARE_PROVIDER_SITE_OTHER): Payer: Medicare Other | Admitting: Internal Medicine

## 2021-01-29 ENCOUNTER — Other Ambulatory Visit: Payer: Self-pay

## 2021-01-29 ENCOUNTER — Encounter: Payer: Self-pay | Admitting: Internal Medicine

## 2021-01-29 VITALS — BP 134/70 | HR 68 | Temp 96.7°F | Resp 16 | Ht 63.0 in | Wt 172.6 lb

## 2021-01-29 DIAGNOSIS — N1831 Chronic kidney disease, stage 3a: Secondary | ICD-10-CM

## 2021-01-29 DIAGNOSIS — R928 Other abnormal and inconclusive findings on diagnostic imaging of breast: Secondary | ICD-10-CM

## 2021-01-29 DIAGNOSIS — E1165 Type 2 diabetes mellitus with hyperglycemia: Secondary | ICD-10-CM | POA: Diagnosis not present

## 2021-01-29 DIAGNOSIS — I10 Essential (primary) hypertension: Secondary | ICD-10-CM | POA: Diagnosis not present

## 2021-01-29 DIAGNOSIS — D72819 Decreased white blood cell count, unspecified: Secondary | ICD-10-CM

## 2021-01-29 DIAGNOSIS — E7800 Pure hypercholesterolemia, unspecified: Secondary | ICD-10-CM

## 2021-01-29 LAB — HM DIABETES FOOT EXAM

## 2021-01-29 MED ORDER — LISINOPRIL 20 MG PO TABS: 20.0000 mg | ORAL_TABLET | Freq: Every day | ORAL | 2 refills | Status: DC

## 2021-01-29 MED ORDER — AMLODIPINE BESYLATE 5 MG PO TABS: 5.0000 mg | ORAL_TABLET | Freq: Every day | ORAL | 2 refills | Status: DC

## 2021-01-29 NOTE — Progress Notes (Signed)
[name], female   DOB: [birth year], 80 y.o.   MRN: [id number]   [name], female    DOB: [birth year], 80 y.o.   MRN: [id number]  HPI This visit occurred during the SARS-CoV-2 public health emergency.  Safety protocols were in place, including screening questions prior to the visit, additional usage of staff PPE, and extensive cleaning of exam room while observing appropriate contact time as indicated for disinfecting solutions.  Patient here for a scheduled follow up.  Here to follow up regarding her blood pressure, cholesterol and blood sugar.  She is doing well. Working.  Feels good.  No chest pain or sob with increased activity or exertion.  No acid reflux or abdominal pain.  Bowels moving.  Trying to watch her diet.  Discussed low carb diet.  Had previous breast biopsy.  Need Dr Ethlyn Gallery notes.  States she is not due yet for mammogram.    Past Medical History:  Diagnosis Date  . Essential hypertension   . History of chicken pox   . Hypercholesterolemia   . Left breast mass    Past Surgical History:  Procedure Laterality Date  . BREAST BIOPSY Left 02/23/2020   Affirm Biopsy- X- Clip, path pending  . BREAST LUMPECTOMY WITH RADIOACTIVE SEED LOCALIZATION Left 04/25/2020   Procedure: LEFT BREAST LUMPECTOMY WITH RADIOACTIVE SEED LOCALIZATION;  Surgeon: Jovita Kussmaul, MD;  Location: [facility];  Service: General;  Laterality: Left;  . cataract surgery     bilateral  . COLONOSCOPY WITH PROPOFOL N/A 12/03/2016   Procedure: COLONOSCOPY WITH PROPOFOL;  Surgeon: Manya Silvas, MD;  Location: [facility] ENDOSCOPY;  Service: Endoscopy;  Laterality: N/A;   Family History  Problem Relation Age of Onset  . Hypertension Mother   . Congestive Heart Failure Mother   . Colon cancer Brother   . Breast cancer Neg Hx    Social History   Socioeconomic History  . Marital status: Single    Spouse name: Not on file  . Number of children: Not  on file  . Years of education: Not on file  . Highest education level: Not on file  Occupational History  . Not on file  Tobacco Use  . Smoking status: Never Smoker  . Smokeless tobacco: Never Used  Substance and Sexual Activity  . Alcohol use: No    Alcohol/week: 0.0 standard drinks  . Drug use: No  . Sexual activity: Not Currently    Birth control/protection: Post-menopausal  Other Topics Concern  . Not on file  Social History Narrative  . Not on file   Social Determinants of Health   Financial Resource Strain: Low Risk   . Difficulty of Paying Living Expenses: Not hard at all  Food Insecurity: No Food Insecurity  . Worried About Charity fundraiser in the Last Year: Never true  . Ran Out of Food in the Last Year: Never true  Transportation Needs: No Transportation Needs  . Lack of Transportation (Medical): No  . Lack of Transportation (Non-Medical): No  Physical Activity: Not on file  Stress: No Stress Concern Present  . Feeling of Stress : Not at all  Social Connections: Unknown  . Frequency of Communication with Friends and Family: Not on file  . Frequency of Social Gatherings with Friends and Family: More than three times a week  . Attends Religious Services: More than 4 times per year  . Active Member of Clubs or Organizations: Yes  .  Attends Archivist Meetings: Not on file  . Marital Status: Not on file    Outpatient Encounter Medications as of 01/29/2021  Medication Sig  . amLODipine (NORVASC) 5 MG tablet Take 1 tablet (5 mg total) by mouth daily.  Marland Kitchen lisinopril (ZESTRIL) 20 MG tablet Take 1 tablet (20 mg total) by mouth daily.  . rosuvastatin (CRESTOR) 10 MG tablet Take 1 tablet (10 mg total) by mouth daily.  . [DISCONTINUED] amLODipine (NORVASC) 5 MG tablet Take 1 tablet (5 mg total) by mouth daily.  . [DISCONTINUED] lisinopril (ZESTRIL) 20 MG tablet Take 1 tablet (20 mg total) by mouth daily.   No facility-administered encounter medications on file  as of 01/29/2021.    Review of Systems  Constitutional: Negative for appetite change and unexpected weight change.  HENT: Negative for congestion.   Respiratory: Negative for cough, chest tightness and shortness of breath.   Cardiovascular: Negative for chest pain, palpitations and leg swelling.  Gastrointestinal: Negative for abdominal pain, diarrhea, nausea and vomiting.  Genitourinary: Negative for difficulty urinating and dysuria.  Musculoskeletal: Negative for joint swelling and myalgias.  Skin: Negative for color change and rash.  Neurological: Negative for dizziness, light-headedness and headaches.  Psychiatric/Behavioral: Negative for agitation and dysphoric mood.       Objective:    Physical Exam Vitals reviewed.  Constitutional:      General: She is not in acute distress.    Appearance: Normal appearance.  HENT:     Head: Normocephalic and atraumatic.     Right Ear: External ear normal.     Left Ear: External ear normal.  Eyes:     General: No scleral icterus.       Right eye: No discharge.        Left eye: No discharge.     Conjunctiva/sclera: Conjunctivae normal.  Neck:     Thyroid: No thyromegaly.  Cardiovascular:     Rate and Rhythm: Normal rate and regular rhythm.  Pulmonary:     Effort: No respiratory distress.     Breath sounds: Normal breath sounds. No wheezing.  Abdominal:     General: Bowel sounds are normal.     Palpations: Abdomen is soft.     Tenderness: There is no abdominal tenderness.  Musculoskeletal:        General: No swelling or tenderness.     Cervical back: Neck supple. No tenderness.  Lymphadenopathy:     Cervical: No cervical adenopathy.  Skin:    Findings: No erythema or rash.  Neurological:     Mental Status: She is alert.  Psychiatric:        Mood and Affect: Mood normal.        Behavior: Behavior normal.     BP 134/70   Pulse 68   Temp (!) 96.7 F (35.9 C) (Temporal)   Resp 16   Ht 5' 3"  (1.6 m)   Wt 172 lb 9.6 oz  (78.3 kg)   SpO2 99%   BMI 30.57 kg/m  Wt Readings from Last 3 Encounters:  01/29/21 172 lb 9.6 oz (78.3 kg)  12/05/20 175 lb (79.4 kg)  10/31/20 176 lb 3.2 oz (79.9 kg)     Lab Results  Component Value Date   WBC 3.7 (L) 01/24/2021   HGB 12.3 01/24/2021   HCT 37.0 01/24/2021   PLT 274.0 01/24/2021   GLUCOSE 96 01/24/2021   CHOL 131 01/24/2021   TRIG 72.0 01/24/2021   HDL 44.20 01/24/2021   LDLCALC 73  01/24/2021   ALT 26 01/24/2021   AST 20 01/24/2021   NA 143 01/24/2021   K 3.9 01/24/2021   CL 110 01/24/2021   CREATININE 1.08 01/24/2021   BUN 25 (H) 01/24/2021   CO2 26 01/24/2021   TSH 2.00 01/24/2021   HGBA1C 6.5 01/24/2021   MICROALBUR 2.6 (H) 12/20/2019    MM Breast Surgical Specimen  Result Date: 04/25/2020 CLINICAL DATA:  Excisional biopsy a high risk complex sclerosing lesion and sclerosing intraductal papilloma from the LEFT breast. Radioactive seed localization was performed yesterday. EXAM: SPECIMEN RADIOGRAPH OF THE LEFT BREAST COMPARISON:  Previous exam(s). FINDINGS: Status post excision of the LEFT breast. The radioactive seed and X shaped tissue marker clip are present in the specimen. This was discussed directly with the operating room nurse at the time of interpretation on 04/25/2020 at 8:45 a.m. IMPRESSION: Specimen radiograph of the LEFT breast. Electronically Signed   By: Evangeline Dakin M.D.   On: 04/25/2020 08:47       Assessment & Plan:   Problem List Items Addressed This Visit    Abnormal mammogram    Recent abnormal mammogram.  S/p breast biopsy and subsequent surgery. Saw Dr Marlou Starks.  Need records to confirm f/u.       CKD (chronic kidney disease) stage 3, GFR 30-59 ml/min (HCC)    Continue lisinopril.  Avoid antiinflammatories.  Stay hydrated. Follow metabolic panel.       Hypercholesterolemia    Continue crestor.  Low cholesterol diet and exercise.  Follow lipid panel and liver function tests.       Relevant Medications   amLODipine  (NORVASC) 5 MG tablet   lisinopril (ZESTRIL) 20 MG tablet   Other Relevant Orders   Hepatic function panel   Lipid panel   Hypertension, essential    Continue amlodipine and lisinopril. Remain off hctz.  Blood pressure as outlined.  Continue current medication.  Follow pressures.  Follow metabolic panel.        Relevant Medications   amLODipine (NORVASC) 5 MG tablet   lisinopril (ZESTRIL) 20 MG tablet   Other Relevant Orders   Basic metabolic panel   Type 2 diabetes mellitus with hyperglycemia (HCC)    Low carb diet and exercise given elevated blood sugar.  Follow met b and a1c.       Relevant Medications   lisinopril (ZESTRIL) 20 MG tablet   Other Relevant Orders   Hemoglobin A1c    Other Visit Diagnoses    Leukopenia, unspecified type    -  Primary   Relevant Orders   CBC with Differential/Platelet       Einar Pheasant, MD

## 2021-01-29 NOTE — Addendum Note (Signed)
Addended by: Leeanne Rio on: 01/29/2021 04:14 PM   Modules accepted: Orders

## 2021-02-03 ENCOUNTER — Telehealth: Payer: Self-pay | Admitting: Internal Medicine

## 2021-02-03 ENCOUNTER — Encounter: Payer: Self-pay | Admitting: Internal Medicine

## 2021-02-03 NOTE — Assessment & Plan Note (Signed)
Continue lisinopril.  Avoid antiinflammatories.  Stay hydrated. Follow metabolic panel.

## 2021-02-03 NOTE — Assessment & Plan Note (Signed)
Recent abnormal mammogram.  S/p breast biopsy and subsequent surgery. Saw Dr Marlou Starks.  Need records to confirm f/u.

## 2021-02-03 NOTE — Assessment & Plan Note (Signed)
Low carb diet and exercise given elevated blood sugar. Follow met b and a1c.   

## 2021-02-03 NOTE — Telephone Encounter (Signed)
Need records from Dr Autumn Messing.(surgery - [place])  Need records related to recent abnormal mammogram, surgery and planned/recommended f/u and any pathology report.

## 2021-02-03 NOTE — Assessment & Plan Note (Signed)
Continue amlodipine and lisinopril. Remain off hctz.  Blood pressure as outlined.  Continue current medication.  Follow pressures.  Follow metabolic panel.

## 2021-02-03 NOTE — Assessment & Plan Note (Signed)
Continue crestor.  Low cholesterol diet and exercise. Follow lipid panel and liver function tests.   

## 2021-02-05 NOTE — Telephone Encounter (Signed)
Records requested from Dr Marlou Starks

## 2021-04-16 DIAGNOSIS — H40023 Open angle with borderline findings, high risk, bilateral: Secondary | ICD-10-CM | POA: Diagnosis not present

## 2021-05-06 ENCOUNTER — Encounter (HOSPITAL_COMMUNITY): Payer: Self-pay

## 2021-06-10 ENCOUNTER — Other Ambulatory Visit (INDEPENDENT_AMBULATORY_CARE_PROVIDER_SITE_OTHER): Payer: Medicare Other

## 2021-06-10 ENCOUNTER — Other Ambulatory Visit: Payer: Self-pay

## 2021-06-10 DIAGNOSIS — E119 Type 2 diabetes mellitus without complications: Secondary | ICD-10-CM

## 2021-06-10 DIAGNOSIS — I10 Essential (primary) hypertension: Secondary | ICD-10-CM

## 2021-06-10 DIAGNOSIS — E1165 Type 2 diabetes mellitus with hyperglycemia: Secondary | ICD-10-CM

## 2021-06-10 DIAGNOSIS — D72819 Decreased white blood cell count, unspecified: Secondary | ICD-10-CM | POA: Diagnosis not present

## 2021-06-10 DIAGNOSIS — E7800 Pure hypercholesterolemia, unspecified: Secondary | ICD-10-CM

## 2021-06-10 LAB — BASIC METABOLIC PANEL
BUN: 20 mg/dL (ref 6–23)
CO2: 26 mEq/L (ref 19–32)
Calcium: 9.9 mg/dL (ref 8.4–10.5)
Chloride: 110 mEq/L (ref 96–112)
Creatinine, Ser: 1.03 mg/dL (ref 0.40–1.20)
GFR: 51.49 mL/min — ABNORMAL LOW (ref >60.00)
Glucose, Bld: 93 mg/dL (ref 70–99)
Potassium: 3.8 mEq/L (ref 3.5–5.1)
Sodium: 144 mEq/L (ref 135–145)

## 2021-06-10 LAB — HEPATIC FUNCTION PANEL
ALT: 24 U/L (ref 0–35)
AST: 18 U/L (ref 0–37)
Albumin: 4.1 g/dL (ref 3.5–5.2)
Alkaline Phosphatase: 66 U/L (ref 39–117)
Bilirubin, Direct: 0.1 mg/dL (ref 0.0–0.3)
Total Bilirubin: 0.4 mg/dL (ref 0.2–1.2)
Total Protein: 6.3 g/dL (ref 6.0–8.3)

## 2021-06-10 LAB — LIPID PANEL
Cholesterol: 140 mg/dL (ref 0–200)
HDL: 48 mg/dL (ref >39.00)
LDL Cholesterol: 76 mg/dL (ref 0–99)
NonHDL: 91.7
Total CHOL/HDL Ratio: 3
Triglycerides: 81 mg/dL (ref 0.0–149.0)
VLDL: 16.2 mg/dL (ref 0.0–40.0)

## 2021-06-10 LAB — MICROALBUMIN / CREATININE URINE RATIO
Creatinine,U: 60.3 mg/dL
Microalb Creat Ratio: 9.4 mg/g (ref 0.0–30.0)
Microalb, Ur: 5.7 mg/dL — ABNORMAL HIGH (ref 0.0–1.9)

## 2021-06-10 LAB — CBC WITH DIFFERENTIAL/PLATELET
Basophils Absolute: 0 10*3/uL (ref 0.0–0.1)
Basophils Relative: 0.4 % (ref 0.0–3.0)
Eosinophils Absolute: 0.1 10*3/uL (ref 0.0–0.7)
Eosinophils Relative: 2.7 % (ref 0.0–5.0)
HCT: 37.6 % (ref 36.0–46.0)
Hemoglobin: 12.6 g/dL (ref 12.0–15.0)
Lymphocytes Relative: 37.2 % (ref 12.0–46.0)
Lymphs Abs: 1.3 10*3/uL (ref 0.7–4.0)
MCHC: 33.5 g/dL (ref 30.0–36.0)
MCV: 92 fl (ref 78.0–100.0)
Monocytes Absolute: 0.3 10*3/uL (ref 0.1–1.0)
Monocytes Relative: 7.3 % (ref 3.0–12.0)
Neutro Abs: 1.8 10*3/uL (ref 1.4–7.7)
Neutrophils Relative %: 52.4 % (ref 43.0–77.0)
Platelets: 229 10*3/uL (ref 150.0–400.0)
RBC: 4.09 Mil/uL (ref 3.87–5.11)
RDW: 15.6 % — ABNORMAL HIGH (ref 11.5–15.5)
WBC: 3.5 10*3/uL — ABNORMAL LOW (ref 4.0–10.5)

## 2021-06-10 LAB — HEMOGLOBIN A1C: Hgb A1c MFr Bld: 6.5 % (ref 4.6–6.5)

## 2021-06-11 ENCOUNTER — Other Ambulatory Visit: Payer: Self-pay

## 2021-06-11 ENCOUNTER — Ambulatory Visit (INDEPENDENT_AMBULATORY_CARE_PROVIDER_SITE_OTHER): Payer: Medicare Other | Admitting: Internal Medicine

## 2021-06-11 VITALS — BP 132/72 | HR 65 | Temp 97.4°F | Resp 16 | Ht 63.0 in | Wt 177.6 lb

## 2021-06-11 DIAGNOSIS — E1165 Type 2 diabetes mellitus with hyperglycemia: Secondary | ICD-10-CM

## 2021-06-11 DIAGNOSIS — Z1231 Encounter for screening mammogram for malignant neoplasm of breast: Secondary | ICD-10-CM

## 2021-06-11 DIAGNOSIS — D72819 Decreased white blood cell count, unspecified: Secondary | ICD-10-CM | POA: Diagnosis not present

## 2021-06-11 DIAGNOSIS — Z0000 Encounter for general adult medical examination without abnormal findings: Secondary | ICD-10-CM | POA: Diagnosis not present

## 2021-06-11 DIAGNOSIS — R928 Other abnormal and inconclusive findings on diagnostic imaging of breast: Secondary | ICD-10-CM

## 2021-06-11 DIAGNOSIS — E7800 Pure hypercholesterolemia, unspecified: Secondary | ICD-10-CM

## 2021-06-11 DIAGNOSIS — I10 Essential (primary) hypertension: Secondary | ICD-10-CM

## 2021-06-11 DIAGNOSIS — N1831 Chronic kidney disease, stage 3a: Secondary | ICD-10-CM

## 2021-06-11 NOTE — Assessment & Plan Note (Signed)
Discussed labs.  A1c 6.5.  She is watching her carbohydrate intake.  Low-carb diet and exercise.  Requested eye exam from [facility].

## 2021-06-11 NOTE — Assessment & Plan Note (Addendum)
Physical today 06/11/21.  Mammogram 09/2019 - Birads IV.  Saw Dr Marlou Starks.  S/p biopsy.  Scheduled f/u mammogram 06/27/21.  Colonoscopy 11/2016 - diverticulosis.

## 2021-06-11 NOTE — Progress Notes (Signed)
[name], female   DOB: 03-21-1941, 80 y.o.   MRN: [id number]   [name], female    DOB: 16-May-1941, 80 y.o.   MRN: [id number]  HPI This visit occurred during the SARS-CoV-2 public health emergency.  Safety protocols were in place, including screening questions prior to the visit, additional usage of staff PPE, and extensive cleaning of exam room while observing appropriate contact time as indicated for disinfecting solutions.   Patient here for her physical exam.  She reports that she is doing well.  Feels good.  Is working.  Works a lot of hours.  Does report some fatigue with increased walking but nothing out of the ordinary.  No chest pain or tightness with increased activity or exertion.  No shortness of breath.  No nausea or vomiting.  No abdominal pain or bowel change.  She has started back walking.  Discussed labs.  Does not check her blood pressures.  Past Medical History:  Diagnosis Date   Essential hypertension    History of chicken pox    Hypercholesterolemia    Left breast mass    Past Surgical History:  Procedure Laterality Date   BREAST BIOPSY Left 02/23/2020   Affirm Biopsy- X- Clip, path pending   BREAST LUMPECTOMY WITH RADIOACTIVE SEED LOCALIZATION Left 04/25/2020   Procedure: LEFT BREAST LUMPECTOMY WITH RADIOACTIVE SEED LOCALIZATION;  Surgeon: Jovita Kussmaul, MD;  Location: [place];  Service: General;  Laterality: Left;   cataract surgery     bilateral   COLONOSCOPY WITH PROPOFOL N/A 12/03/2016   Procedure: COLONOSCOPY WITH PROPOFOL;  Surgeon: Manya Silvas, MD;  Location: [facility] ENDOSCOPY;  Service: Endoscopy;  Laterality: N/A;   Family History  Problem Relation Age of Onset   Hypertension Mother    Congestive Heart Failure Mother    Colon cancer Brother    Breast cancer Neg Hx    Social History   Socioeconomic History   Marital status: Single    Spouse name: Not on file   Number of  children: Not on file   Years of education: Not on file   Highest education level: Not on file  Occupational History   Not on file  Tobacco Use   Smoking status: Never   Smokeless tobacco: Never  Substance and Sexual Activity   Alcohol use: No    Alcohol/week: 0.0 standard drinks   Drug use: No   Sexual activity: Not Currently    Birth control/protection: Post-menopausal  Other Topics Concern   Not on file  Social History Narrative   Not on file   Social Determinants of Health   Financial Resource Strain: Low Risk    Difficulty of Paying Living Expenses: Not hard at all  Food Insecurity: No Food Insecurity   Worried About Charity fundraiser in the Last Year: Never true   [place] in the Last Year: Never true  Transportation Needs: No Transportation Needs   Lack of Transportation (Medical): No   Lack of Transportation (Non-Medical): No  Physical Activity: Not on file  Stress: No Stress Concern Present   Feeling of Stress : Not at all  Social Connections: Unknown   Frequency of Communication with Friends and Family: Not on file   Frequency of Social Gatherings with Friends and Family: More than three times a week   Attends Religious Services: More than 4 times per year   Active Member of Clubs or Organizations: Yes  Attends Archivist Meetings: Not on file   Marital Status: Not on file    Review of Systems  Constitutional:  Negative for appetite change and unexpected weight change.  HENT:  Negative for congestion, sinus pressure and sore throat.   Eyes:  Negative for pain and visual disturbance.  Respiratory:  Negative for cough, chest tightness and shortness of breath.   Cardiovascular:  Negative for chest pain and palpitations.  Gastrointestinal:  Negative for abdominal pain, diarrhea, nausea and vomiting.  Genitourinary:  Negative for difficulty urinating and dysuria.  Musculoskeletal:  Negative for joint swelling and myalgias.  Skin:  Negative  for color change and rash.  Neurological:  Negative for dizziness, light-headedness and headaches.  Hematological:  Negative for adenopathy. Does not bruise/bleed easily.  Psychiatric/Behavioral:  Negative for agitation and dysphoric mood.       Objective:    Physical Exam Vitals reviewed.  Constitutional:      General: She is not in acute distress.    Appearance: Normal appearance. She is well-developed.  HENT:     Head: Normocephalic and atraumatic.     Right Ear: External ear normal.     Left Ear: External ear normal.  Eyes:     General: No scleral icterus.       Right eye: No discharge.        Left eye: No discharge.     Conjunctiva/sclera: Conjunctivae normal.  Neck:     Thyroid: No thyromegaly.  Cardiovascular:     Rate and Rhythm: Normal rate and regular rhythm.     Comments: [id number] systolic murmur Pulmonary:     Effort: No tachypnea, accessory muscle usage or respiratory distress.     Breath sounds: Normal breath sounds. No decreased breath sounds, wheezing or rhonchi.  Chest:  Breasts:    Right: No inverted nipple, mass, nipple discharge or tenderness (no axillary adenopathy).     Left: No inverted nipple, mass, nipple discharge or tenderness (no axilarry adenopathy).  Abdominal:     General: Bowel sounds are normal.     Palpations: Abdomen is soft.     Tenderness: There is no abdominal tenderness.  Musculoskeletal:        General: No swelling or tenderness.     Cervical back: Neck supple.  Lymphadenopathy:     Cervical: No cervical adenopathy.  Skin:    General: Skin is warm.     Findings: No erythema or rash.  Neurological:     Mental Status: She is alert and oriented to person, place, and time.  Psychiatric:        Mood and Affect: Mood normal.        Behavior: Behavior normal.    BP 132/72   Pulse 65   Temp (!) 97.4 F (36.3 C)   Resp 16   Ht '5\' 3"'$  (1.6 m)   Wt 177 lb 9.6 oz (80.6 kg)   SpO2 98%   BMI 31.46 kg/m  Wt Readings from Last 3  Encounters:  06/11/21 177 lb 9.6 oz (80.6 kg)  01/29/21 172 lb 9.6 oz (78.3 kg)  12/05/20 175 lb (79.4 kg)    Outpatient Encounter Medications as of 06/11/2021  Medication Sig   amLODipine (NORVASC) 5 MG tablet Take 1 tablet (5 mg total) by mouth daily.   lisinopril (ZESTRIL) 20 MG tablet Take 1 tablet (20 mg total) by mouth daily.   rosuvastatin (CRESTOR) 10 MG tablet Take 1 tablet (10 mg total) by mouth daily.  No facility-administered encounter medications on file as of 06/11/2021.     Lab Results  Component Value Date   WBC 3.5 (L) 06/10/2021   HGB 12.6 06/10/2021   HCT 37.6 06/10/2021   PLT 229.0 06/10/2021   GLUCOSE 93 06/10/2021   CHOL 140 06/10/2021   TRIG 81.0 06/10/2021   HDL 48.00 06/10/2021   LDLCALC 76 06/10/2021   ALT 24 06/10/2021   AST 18 06/10/2021   NA 144 06/10/2021   K 3.8 06/10/2021   CL 110 06/10/2021   CREATININE 1.03 06/10/2021   BUN 20 06/10/2021   CO2 26 06/10/2021   TSH 2.00 01/24/2021   HGBA1C 6.5 06/10/2021   MICROALBUR 5.7 (H) 06/10/2021    MM Breast Surgical Specimen  Result Date: 04/25/2020 CLINICAL DATA:  Excisional biopsy a high risk complex sclerosing lesion and sclerosing intraductal papilloma from the LEFT breast. Radioactive seed localization was performed yesterday. EXAM: SPECIMEN RADIOGRAPH OF THE LEFT BREAST COMPARISON:  Previous exam(s). FINDINGS: Status post excision of the LEFT breast. The radioactive seed and X shaped tissue marker clip are present in the specimen. This was discussed directly with the operating room nurse at the time of interpretation on 04/25/2020 at 8:45 a.m. IMPRESSION: Specimen radiograph of the LEFT breast. Electronically Signed   By: Evangeline Dakin M.D.   On: 04/25/2020 08:47       Assessment & Plan:   Problem List Items Addressed This Visit     Abnormal mammogram   CKD (chronic kidney disease) stage 3, GFR 30-59 ml/min (HCC)    Chronic and stable Recheck metabolic panel Avoid nephrotoxic meds.  Continue lisinopril.       Health care maintenance    Physical today 06/11/21.  Mammogram 09/2019 - Birads IV.  Saw Dr Marlou Starks.  S/p biopsy.  Scheduled f/u mammogram 06/27/21.  Colonoscopy 11/2016 - diverticulosis.       Hypercholesterolemia    Continue crestor.  Low cholesterol diet and exercise.  Follow lipid panel and liver function tests.       Relevant Orders   Hepatic function panel   Lipid panel   Hypertension, essential    Continue amlodipine and lisinopril. Remain off hctz.  Blood pressure as outlined.  Continue current medication.  Follow pressures.  Follow metabolic panel.        Leukopenia    Wbc 3.5. overall stable.  Follow cbc.       Relevant Orders   CBC with Differential/Platelet   Type 2 diabetes mellitus with hyperglycemia ([place])    Discussed labs.  A1c 6.5.  She is watching her carbohydrate intake.  Low-carb diet and exercise.  Requested eye exam from [facility].      Relevant Orders   Hemoglobin [id number]   Basic metabolic panel   Other Visit Diagnoses     Routine general medical examination at a health care facility    -  Primary   Encounter for screening mammogram for malignant neoplasm of breast       Relevant Orders   MM 3D SCREEN BREAST BILATERAL        Einar Pheasant, MD

## 2021-06-17 ENCOUNTER — Encounter: Payer: Self-pay | Admitting: Internal Medicine

## 2021-06-17 NOTE — Assessment & Plan Note (Signed)
Continue crestor.  Low cholesterol diet and exercise. Follow lipid panel and liver function tests.   

## 2021-06-17 NOTE — Assessment & Plan Note (Signed)
Wbc 3.5. overall stable.  Follow cbc.

## 2021-06-17 NOTE — Assessment & Plan Note (Signed)
Continue amlodipine and lisinopril. Remain off hctz.  Blood pressure as outlined.  Continue current medication.  Follow pressures.  Follow metabolic panel.

## 2021-06-17 NOTE — Assessment & Plan Note (Signed)
Chronic and stable Recheck metabolic panel Avoid nephrotoxic meds. Continue lisinopril.

## 2021-06-27 ENCOUNTER — Other Ambulatory Visit: Payer: Self-pay

## 2021-06-27 ENCOUNTER — Ambulatory Visit
Admission: RE | Admit: 2021-06-27 | Discharge: 2021-06-27 | Disposition: A | Discharge status: Home / Self Care | Payer: Medicare Other | Source: Ambulatory Visit | Attending: Internal Medicine | Admitting: Internal Medicine

## 2021-06-27 DIAGNOSIS — Z1231 Encounter for screening mammogram for malignant neoplasm of breast: Secondary | ICD-10-CM | POA: Diagnosis not present

## 2021-08-09 ENCOUNTER — Ambulatory Visit (INDEPENDENT_AMBULATORY_CARE_PROVIDER_SITE_OTHER): Payer: Medicare Other

## 2021-08-09 VITALS — Ht 63.0 in | Wt 177.0 lb

## 2021-08-09 DIAGNOSIS — Z0000 Encounter for general adult medical examination without abnormal findings: Secondary | ICD-10-CM | POA: Diagnosis not present

## 2021-08-09 NOTE — Patient Instructions (Addendum)
Sheri Cook , Thank you for taking time to come for your Medicare Wellness Visit. I appreciate your ongoing commitment to your health goals. Please review the following plan we discussed and let me know if I can assist you in the future.   These are the goals we discussed:  Goals       Patient Stated     I'd like to lose some weight (pt-stated)      I want to walk more for exercise Portion control Weight goal 160lb        This is a list of the screening recommended for you and due dates:  Health Maintenance  Topic Date Due   COVID-19 Vaccine (4 - Booster) 08/25/2021*   Flu Shot  01/24/2022*   Tetanus Vaccine  08/09/2022*   Hemoglobin A1C  12/11/2021   Complete foot exam   01/29/2022   Eye exam for diabetics  04/10/2022   Mammogram  06/27/2022   DEXA scan (bone density measurement)  Completed   Zoster (Shingles) Vaccine  Completed   HPV Vaccine  Aged Out  *Topic was postponed. The date shown is not the original due date.    Advanced directives: End of life planning; Advance aging; Advanced directives discussed.  Copy of current HCPOA/Living Will requested.    Conditions/risks identified: none new  Follow up in one year for your annual wellness visit    Preventive Care 65 Years and Older, Female Preventive care refers to lifestyle choices and visits with your health care provider that can promote health and wellness. What does preventive care include? A yearly physical exam. This is also called an annual well check. Dental exams once or twice a year. Routine eye exams. Ask your health care provider how often you should have your eyes checked. Personal lifestyle choices, including: Daily care of your teeth and gums. Regular physical activity. Eating a healthy diet. Avoiding tobacco and drug use. Limiting alcohol use. Practicing safe sex. Taking low-dose aspirin every day. Taking vitamin and mineral supplements as recommended by your health care provider. What  happens during an annual well check? The services and screenings done by your health care provider during your annual well check will depend on your age, overall health, lifestyle risk factors, and family history of disease. Counseling  Your health care provider may ask you questions about your: Alcohol use. Tobacco use. Drug use. Emotional well-being. Home and relationship well-being. Sexual activity. Eating habits. History of falls. Memory and ability to understand (cognition). Work and work Statistician. Reproductive health. Screening  You may have the following tests or measurements: Height, weight, and BMI. Blood pressure. Lipid and cholesterol levels. These may be checked every 5 years, or more frequently if you are over 82 years old. Skin check. Lung cancer screening. You may have this screening every year starting at age 20 if you have a 30-pack-year history of smoking and currently smoke or have quit within the past 15 years. Fecal occult blood test (FOBT) of the stool. You may have this test every year starting at age 23. Flexible sigmoidoscopy or colonoscopy. You may have a sigmoidoscopy every 5 years or a colonoscopy every 10 years starting at age 7. Hepatitis C blood test. Hepatitis B blood test. Sexually transmitted disease (STD) testing. Diabetes screening. This is done by checking your blood sugar (glucose) after you have not eaten for a while (fasting). You may have this done every 1-3 years. Bone density scan. This is done to screen for osteoporosis. You may  have this done starting at age 73. Mammogram. This may be done every 1-2 years. Talk to your health care provider about how often you should have regular mammograms. Talk with your health care provider about your test results, treatment options, and if necessary, the need for more tests. Vaccines  Your health care provider may recommend certain vaccines, such as: Influenza vaccine. This is recommended every  year. Tetanus, diphtheria, and acellular pertussis (Tdap, Td) vaccine. You may need a Td booster every 10 years. Zoster vaccine. You may need this after age 33. Pneumococcal 13-valent conjugate (PCV13) vaccine. One dose is recommended after age 56. Pneumococcal polysaccharide (PPSV23) vaccine. One dose is recommended after age 60. Talk to your health care provider about which screenings and vaccines you need and how often you need them. This information is not intended to replace advice given to you by your health care provider. Make sure you discuss any questions you have with your health care provider. Document Released: 11/09/2015 Document Revised: 07/02/2016 Document Reviewed: 08/14/2015 Elsevier Interactive Patient Education  2017 [place] Prevention in the Home Falls can cause injuries. They can happen to people of all ages. There are many things you can do to make your home safe and to help prevent falls. What can I do on the outside of my home? Regularly fix the edges of walkways and driveways and fix any cracks. Remove anything that might make you trip as you walk through a door, such as a raised step or threshold. Trim any bushes or trees on the path to your home. Use bright outdoor lighting. Clear any walking paths of anything that might make someone trip, such as rocks or tools. Regularly check to see if handrails are loose or broken. Make sure that both sides of any steps have handrails. Any raised decks and porches should have guardrails on the edges. Have any leaves, snow, or ice cleared regularly. Use sand or salt on walking paths during winter. Clean up any spills in your garage right away. This includes oil or grease spills. What can I do in the bathroom? Use night lights. Install grab bars by the toilet and in the tub and shower. Do not use towel bars as grab bars. Use non-skid mats or decals in the tub or shower. If you need to sit down in the shower, use a  plastic, non-slip stool. Keep the floor dry. Clean up any water that spills on the floor as soon as it happens. Remove soap buildup in the tub or shower regularly. Attach bath mats securely with double-sided non-slip rug tape. Do not have throw rugs and other things on the floor that can make you trip. What can I do in the bedroom? Use night lights. Make sure that you have a light by your bed that is easy to reach. Do not use any sheets or blankets that are too big for your bed. They should not hang down onto the floor. Have a firm chair that has side arms. You can use this for support while you get dressed. Do not have throw rugs and other things on the floor that can make you trip. What can I do in the kitchen? Clean up any spills right away. Avoid walking on wet floors. Keep items that you use a lot in easy-to-reach places. If you need to reach something above you, use a strong step stool that has a grab bar. Keep electrical cords out of the way. Do not use floor polish  or wax that makes floors slippery. If you must use wax, use non-skid floor wax. Do not have throw rugs and other things on the floor that can make you trip. What can I do with my stairs? Do not leave any items on the stairs. Make sure that there are handrails on both sides of the stairs and use them. Fix handrails that are broken or loose. Make sure that handrails are as long as the stairways. Check any carpeting to make sure that it is firmly attached to the stairs. Fix any carpet that is loose or worn. Avoid having throw rugs at the top or bottom of the stairs. If you do have throw rugs, attach them to the floor with carpet tape. Make sure that you have a light switch at the top of the stairs and the bottom of the stairs. If you do not have them, ask someone to add them for you. What else can I do to help prevent falls? Wear shoes that: Do not have high heels. Have rubber bottoms. Are comfortable and fit you  well. Are closed at the toe. Do not wear sandals. If you use a stepladder: Make sure that it is fully opened. Do not climb a closed stepladder. Make sure that both sides of the stepladder are locked into place. Ask someone to hold it for you, if possible. Clearly mark and make sure that you can see: Any grab bars or handrails. First and last steps. Where the edge of each step is. Use tools that help you move around (mobility aids) if they are needed. These include: Canes. Walkers. Scooters. Crutches. Turn on the lights when you go into a dark area. Replace any light bulbs as soon as they burn out. Set up your furniture so you have a clear path. Avoid moving your furniture around. If any of your floors are uneven, fix them. If there are any pets around you, be aware of where they are. Review your medicines with your doctor. Some medicines can make you feel dizzy. This can increase your chance of falling. Ask your doctor what other things that you can do to help prevent falls. This information is not intended to replace advice given to you by your health care provider. Make sure you discuss any questions you have with your health care provider. Document Released: 08/09/2009 Document Revised: 03/20/2016 Document Reviewed: 11/17/2014 Elsevier Interactive Patient Education  2017 [employer].

## 2021-08-09 NOTE — Progress Notes (Signed)
Subjective:   Sheri Cook is a 80 y.o. female who presents for Medicare Annual (Subsequent) preventive examination.  Review of Systems    No ROS.  Medicare Wellness Virtual Visit.  Visual/audio telehealth visit, UTA vital signs.   See social history for additional risk factors.   Cardiac Risk Factors include: advanced age (>51men, >69 women)     Objective:    Today's Vitals   08/09/21 0947  Weight: 177 lb (80.3 kg)  Height: 5\' 3"  (1.6 m)   Body mass index is 31.35 kg/m.  Advanced Directives 08/09/2021 08/08/2020 04/25/2020 04/18/2020 08/08/2019 08/02/2018 07/30/2017  Does Patient Have a Medical Advance Directive? Yes Yes Yes No Yes Yes Yes  Type of Paramedic of [place];Living will [place];Living will - - [place];Living will Living will;Healthcare Power of [place];Living will  Does patient want to make changes to medical advance directive? No - Patient declined No - Patient declined No - Patient declined - No - Patient declined No - Patient declined No - Patient declined  Copy of [place] in Chart? No - copy requested Yes - validated most recent copy scanned in chart (See row information) - - Yes - validated most recent copy scanned in chart (See row information) Yes Yes  Would patient like information on creating a medical advance directive? - - No - Patient declined No - Patient declined - - -    Current Medications (verified) Outpatient Encounter Medications as of 08/09/2021  Medication Sig   amLODipine (NORVASC) 5 MG tablet Take 1 tablet (5 mg total) by mouth daily.   lisinopril (ZESTRIL) 20 MG tablet Take 1 tablet (20 mg total) by mouth daily.   rosuvastatin (CRESTOR) 10 MG tablet Take 1 tablet (10 mg total) by mouth daily.   No facility-administered encounter medications on file as of 08/09/2021.    Allergies (verified) Patient has no known allergies.    History: Past Medical History:  Diagnosis Date   Essential hypertension    History of chicken pox    Hypercholesterolemia    Left breast mass    Past Surgical History:  Procedure Laterality Date   BREAST BIOPSY Left 02/23/2020   Affirm Biopsy- X- Clip, path pending   BREAST LUMPECTOMY WITH RADIOACTIVE SEED LOCALIZATION Left 04/25/2020   Procedure: LEFT BREAST LUMPECTOMY WITH RADIOACTIVE SEED LOCALIZATION;  Surgeon: Jovita Kussmaul, MD;  Location: [facility];  Service: General;  Laterality: Left;   cataract surgery     bilateral   COLONOSCOPY WITH PROPOFOL N/A 12/03/2016   Procedure: COLONOSCOPY WITH PROPOFOL;  Surgeon: Manya Silvas, MD;  Location: [facility] ENDOSCOPY;  Service: Endoscopy;  Laterality: N/A;   Family History  Problem Relation Age of Onset   Hypertension Mother    Congestive Heart Failure Mother    Colon cancer Brother    Breast cancer Neg Hx    Social History   Socioeconomic History   Marital status: Single    Spouse name: Not on file   Number of children: Not on file   Years of education: Not on file   Highest education level: Not on file  Occupational History   Not on file  Tobacco Use   Smoking status: Never   Smokeless tobacco: Never  Substance and Sexual Activity   Alcohol use: No    Alcohol/week: 0.0 standard drinks   Drug use: No   Sexual activity: Not Currently    Birth  control/protection: Post-menopausal  Other Topics Concern   Not on file  Social History Narrative   Not on file   Social Determinants of Health   Financial Resource Strain: Low Risk    Difficulty of Paying Living Expenses: Not hard at all  Food Insecurity: No Food Insecurity   Worried About Charity fundraiser in the Last Year: Never true   Arboriculturist in the Last Year: Never true  Transportation Needs: No Transportation Needs   Lack of Transportation (Medical): No   Lack of Transportation (Non-Medical): No  Physical Activity: Sufficiently Active    Days of Exercise per Week: 3 days   Minutes of Exercise per Session: 60 min  Stress: No Stress Concern Present   Feeling of Stress : Not at all  Social Connections: Unknown   Frequency of Communication with Friends and Family: Not on file   Frequency of Social Gatherings with Friends and Family: More than three times a week   Attends Religious Services: More than 4 times per year   Active Member of [employer] or Organizations: Yes   Attends Archivist Meetings: Not on file   Marital Status: Not on file    Tobacco Counseling Counseling given: Not Answered   Clinical Intake:  Pre-visit preparation completed: Yes    Nutrition Risk Assessment: Has the patient had any N/V/D within the last 2 months?  No  Does the patient have any non-healing wounds?  No  Has the patient had any unintentional weight loss or weight gain?  No   Diabetes: If diabetic, was a CBG obtained today?  No  How often do you monitor your CBG's? Does not monitor at home.   Financial Strains and Diabetes Management: Are you having any financial strains with the device, your supplies or your medication? No .  Does the patient want to be seen by Chronic Care Management for management of their diabetes?  No  Would the patient like to be referred to a Nutritionist or for Diabetic Management?  No     Diabetes: Yes (Followed by PCP)  How often do you need to have someone help you when you read instructions, pamphlets, or other written materials from your doctor or pharmacy?: 1 - Never   Interpreter Needed?: No      Activities of Daily Living In your present state of health, do you have any difficulty performing the following activities: 08/09/2021  Hearing? N  Vision? N  Difficulty concentrating or making decisions? N  Walking or climbing stairs? N  Dressing or bathing? N  Doing errands, shopping? N  Preparing Food and eating ? N  Using the Toilet? N  In the past six months, have you accidently  leaked urine? N  Do you have problems with loss of bowel control? N  Managing your Medications? N  Managing your Finances? N  Housekeeping or managing your Housekeeping? N  Some recent data might be hidden    Patient Care Team: Einar Pheasant, MD as PCP - General (Internal Medicine)  Indicate any recent Medical Services you may have received from other than [facility] providers in the past year (date may be approximate).     Assessment:   This is a routine wellness examination for [place].  I connected with [place] today by telephone and verified that I am speaking with the correct person using two identifiers. 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    I discussed the  limitations, risks, security and privacy concerns of performing an evaluation and management service by telephone and the availability of in person appointments. The patient expressed understanding and verbally consented to this telephonic visit.    Interactive audio and video telecommunications were attempted between this provider and patient, however failed, due to patient having technical difficulties OR patient did not have access to video capability.  We continued and completed visit with audio only.  Some vital signs may be absent or patient reported.   Hearing/Vision screen Hearing Screening - Comments:: Patient is able to hear conversational tones without difficulty.  No issues reported. Vision Screening - Comments:: Wears corrective lenses  Cataract extraction, bilateral No retinopathy  Glaucoma suspect; no drops in use. They have seen their ophthalmologist in the last 6 months.   Dietary issues and exercise activities discussed: Current Exercise Habits: Home exercise routine, Type of exercise: walking, Intensity: Mild Regular diet Good water intake   Goals Addressed               This Visit's Progress     Patient Stated     I'd  like to lose some weight (pt-stated)        I want to walk more for exercise Portion control Weight goal 160lb       Depression Screen PHQ 2/9 Scores 08/09/2021 08/08/2020 02/09/2020 08/08/2019 03/09/2019 08/02/2018 07/30/2017  PHQ - 2 Score 0 0 0 0 0 0 0  PHQ- 9 Score - - - - - - 0    Fall Risk Fall Risk  08/09/2021 08/08/2020 02/09/2020 08/08/2019 08/02/2018  Falls in the past year? 0 0 0 0 No  Number falls in past yr: - 0 0 - -  Injury with Fall? - - - - -  Follow up Falls evaluation completed Falls evaluation completed Falls evaluation completed - -    FALL RISK PREVENTION PERTAINING TO THE HOME: Any stairs in or around the home? No  Home free of loose throw rugs in walkways, pet beds, electrical cords, etc? Yes Adequate lighting in your home to reduce risk of falls? Yes   ASSISTIVE DEVICES UTILIZED TO PREVENT FALLS: Life alert? No  Use of a cane, walker or w/c? No  Grab bars in the bathroom? No  Shower chair or bench in shower? No  Elevated toilet seat or a handicapped toilet? No   TIMED UP AND GO: Was the test performed? No .   Cognitive Function: MMSE - Mini Mental State Exam 07/30/2017 07/28/2016 07/27/2015  Orientation to time 5 5 5   Orientation to Place 5 5 5   Registration 3 3 3   Attention/ Calculation 4 5 5   Attention/Calculation-comments Difficulty subtracting simple calculations - -  Recall 3 2 3   Language- name 2 objects 2 2 2   Language- repeat 1 1 1   Language- follow 3 step command 3 3 3   Language- read & follow direction 1 1 1   Write a sentence 1 1 1   Copy design 1 1 1   Total score 29 29 30      6CIT Screen 08/09/2021 08/08/2020 08/08/2019 08/02/2018  What Year? 0 points 0 points 0 points 0 points  What month? 0 points 0 points 0 points 0 points  What time? 0 points - 0 points 0 points  Count back from 20 0 points - 0 points 0 points  Months in reverse 0 points 0 points 0 points 0 points  Repeat phrase 0 points - - 0 points  Total Score 0 - -  0     Immunizations Immunization History  Administered Date(s) Administered   Fluad Quad(high Dose 65+) 08/08/2019, 08/03/2020   Influenza, High Dose Seasonal PF 07/28/2016, 07/30/2017, 08/02/2018   Influenza-Unspecified 08/08/2014, 07/13/2015   Moderna SARS-COV2 Booster Vaccination 08/17/2020   Moderna Sars-Covid-2 Vaccination 01/26/2020, 02/16/2020   Pneumococcal Conjugate-13 07/27/2015   Pneumococcal Polysaccharide-23 12/15/2016   Zoster Recombinat (Shingrix) 09/21/2019, 12/30/2019   Influenza vaccine- scheduled nurse visit.   TDAP status: Due, Education has been provided regarding the importance of this vaccine. Advised may receive this vaccine at local pharmacy or Health Dept. Aware to provide a copy of the vaccination record if obtained from local pharmacy or Health Dept. Verbalized acceptance and understanding. Deferred.   Health Maintenance Health Maintenance  Topic Date Due   COVID-19 Vaccine (4 - Booster) 08/25/2021 (Originally 12/18/2020)   INFLUENZA VACCINE  01/24/2022 (Originally 05/27/2021)   TETANUS/TDAP  08/09/2022 (Originally 04/22/1960)   HEMOGLOBIN A1C  12/11/2021   FOOT EXAM  01/29/2022   OPHTHALMOLOGY EXAM  04/10/2022   MAMMOGRAM  06/27/2022   DEXA SCAN  Completed   Zoster Vaccines- Shingrix  Completed   HPV VACCINES  Aged Out   Colorectal cancer screening: No longer required.   Lung Cancer Screening: (Low Dose CT Chest recommended if Age 49-80 years, 30 pack-year currently smoking OR have quit w/in 15years.) does not qualify.   Hepatitis C Screening: does not qualify  Vision Screening: Recommended annual ophthalmology exams for early detection of glaucoma and other disorders of the eye.  Dental Screening: Recommended annual dental exams for proper oral hygiene. Visits every 6 months.   Community Resource Referral / Chronic Care Management: CRR required this visit?  No   CCM required this visit?  No      Plan:   Keep all routine maintenance  appointments.   I have personally reviewed and noted the following in the patient's chart:   Medical and social history Use of alcohol, tobacco or illicit drugs  Current medications and supplements including opioid prescriptions. Not taking any opioids.  Functional ability and status Nutritional status Physical activity Advanced directives List of other physicians Hospitalizations, surgeries, and ER visits in previous 12 months Vitals Screenings to include cognitive, depression, and falls Referrals and appointments  In addition, I have reviewed and discussed with patient certain preventive protocols, quality metrics, and best practice recommendations. A written personalized care plan for preventive services as well as general preventive health recommendations were provided to patient via mail.     Varney Biles, LPN   15/40/[id number]

## 2021-08-13 ENCOUNTER — Ambulatory Visit (INDEPENDENT_AMBULATORY_CARE_PROVIDER_SITE_OTHER): Payer: Medicare Other

## 2021-08-13 ENCOUNTER — Other Ambulatory Visit: Payer: Self-pay

## 2021-08-13 DIAGNOSIS — Z23 Encounter for immunization: Secondary | ICD-10-CM | POA: Diagnosis not present

## 2021-08-27 ENCOUNTER — Other Ambulatory Visit: Payer: Self-pay | Admitting: Internal Medicine

## 2021-10-09 ENCOUNTER — Other Ambulatory Visit: Payer: Self-pay

## 2021-10-09 ENCOUNTER — Other Ambulatory Visit (INDEPENDENT_AMBULATORY_CARE_PROVIDER_SITE_OTHER): Payer: Medicare Other

## 2021-10-09 DIAGNOSIS — E1165 Type 2 diabetes mellitus with hyperglycemia: Secondary | ICD-10-CM | POA: Diagnosis not present

## 2021-10-09 DIAGNOSIS — E7800 Pure hypercholesterolemia, unspecified: Secondary | ICD-10-CM

## 2021-10-09 DIAGNOSIS — D72819 Decreased white blood cell count, unspecified: Secondary | ICD-10-CM

## 2021-10-09 LAB — LIPID PANEL
Cholesterol: 222 mg/dL — ABNORMAL HIGH (ref 0–200)
HDL: 47.2 mg/dL (ref >39.00)
LDL Cholesterol: 154 mg/dL — ABNORMAL HIGH (ref 0–99)
NonHDL: 174.62
Total CHOL/HDL Ratio: 5
Triglycerides: 102 mg/dL (ref 0.0–149.0)
VLDL: 20.4 mg/dL (ref 0.0–40.0)

## 2021-10-09 LAB — CBC WITH DIFFERENTIAL/PLATELET
Basophils Absolute: 0 10*3/uL (ref 0.0–0.1)
Basophils Relative: 0.5 % (ref 0.0–3.0)
Eosinophils Absolute: 0.2 10*3/uL (ref 0.0–0.7)
Eosinophils Relative: 4.4 % (ref 0.0–5.0)
HCT: 38.3 % (ref 36.0–46.0)
Hemoglobin: 12.8 g/dL (ref 12.0–15.0)
Lymphocytes Relative: 36.4 % (ref 12.0–46.0)
Lymphs Abs: 1.3 10*3/uL (ref 0.7–4.0)
MCHC: 33.3 g/dL (ref 30.0–36.0)
MCV: 92.2 fl (ref 78.0–100.0)
Monocytes Absolute: 0.2 10*3/uL (ref 0.1–1.0)
Monocytes Relative: 6.4 % (ref 3.0–12.0)
Neutro Abs: 1.9 10*3/uL (ref 1.4–7.7)
Neutrophils Relative %: 52.3 % (ref 43.0–77.0)
Platelets: 265 10*3/uL (ref 150.0–400.0)
RBC: 4.16 Mil/uL (ref 3.87–5.11)
RDW: 15 % (ref 11.5–15.5)
WBC: 3.7 10*3/uL — ABNORMAL LOW (ref 4.0–10.5)

## 2021-10-09 LAB — HEPATIC FUNCTION PANEL
ALT: 24 U/L (ref 0–35)
AST: 18 U/L (ref 0–37)
Albumin: 4 g/dL (ref 3.5–5.2)
Alkaline Phosphatase: 73 U/L (ref 39–117)
Bilirubin, Direct: 0.1 mg/dL (ref 0.0–0.3)
Total Bilirubin: 0.5 mg/dL (ref 0.2–1.2)
Total Protein: 6.5 g/dL (ref 6.0–8.3)

## 2021-10-09 LAB — BASIC METABOLIC PANEL
BUN: 21 mg/dL (ref 6–23)
CO2: 24 mEq/L (ref 19–32)
Calcium: 9.8 mg/dL (ref 8.4–10.5)
Chloride: 111 mEq/L (ref 96–112)
Creatinine, Ser: 1 mg/dL (ref 0.40–1.20)
GFR: 53.22 mL/min — ABNORMAL LOW (ref >60.00)
Glucose, Bld: 95 mg/dL (ref 70–99)
Potassium: 3.8 mEq/L (ref 3.5–5.1)
Sodium: 143 mEq/L (ref 135–145)

## 2021-10-09 LAB — HEMOGLOBIN A1C: Hgb A1c MFr Bld: 6.4 % (ref 4.6–6.5)

## 2021-10-11 ENCOUNTER — Encounter: Payer: Self-pay | Admitting: Internal Medicine

## 2021-10-11 ENCOUNTER — Other Ambulatory Visit: Payer: Self-pay

## 2021-10-11 ENCOUNTER — Ambulatory Visit (INDEPENDENT_AMBULATORY_CARE_PROVIDER_SITE_OTHER): Payer: Medicare Other | Admitting: Internal Medicine

## 2021-10-11 DIAGNOSIS — E1165 Type 2 diabetes mellitus with hyperglycemia: Secondary | ICD-10-CM

## 2021-10-11 DIAGNOSIS — D72819 Decreased white blood cell count, unspecified: Secondary | ICD-10-CM | POA: Diagnosis not present

## 2021-10-11 DIAGNOSIS — N1831 Chronic kidney disease, stage 3a: Secondary | ICD-10-CM

## 2021-10-11 DIAGNOSIS — E7800 Pure hypercholesterolemia, unspecified: Secondary | ICD-10-CM

## 2021-10-11 DIAGNOSIS — I10 Essential (primary) hypertension: Secondary | ICD-10-CM

## 2021-10-11 MED ORDER — ROSUVASTATIN CALCIUM 10 MG PO TABS: 10.0000 mg | ORAL_TABLET | Freq: Every day | ORAL | 2 refills | Status: DC

## 2021-10-11 MED ORDER — AMLODIPINE BESYLATE 5 MG PO TABS: 5.0000 mg | ORAL_TABLET | Freq: Every day | ORAL | 3 refills | Status: DC

## 2021-10-11 MED ORDER — LISINOPRIL 20 MG PO TABS: 20.0000 mg | ORAL_TABLET | Freq: Every day | ORAL | 3 refills | Status: DC

## 2021-10-11 NOTE — Addendum Note (Signed)
Addended by: Lars Masson on: 10/11/2021 09:16 AM   Modules accepted: Orders

## 2021-10-11 NOTE — Assessment & Plan Note (Signed)
Continue amlodipine and lisinopril. Remain off hctz.  Blood pressure as outlined.  Continue current medication.  Follow pressures.  Follow metabolic panel.

## 2021-10-11 NOTE — Progress Notes (Signed)
[name], female   DOB: 11/26/1940, 80 y.o.   MRN: [id number]   [name], female    DOB: 1940-11-10, 80 y.o.   MRN: [id number]  This visit occurred during the SARS-CoV-2 public health emergency.  Safety protocols were in place, including screening questions prior to the visit, additional usage of staff PPE, and extensive cleaning of exam room while observing appropriate contact time as indicated for disinfecting solutions.   Patient here for a scheduled follow up.   Chief Complaint  Patient presents with   Hypertension   Hyperlipidemia   .   Hypertension Pertinent negatives include no chest pain, headaches, palpitations or shortness of breath.  Hyperlipidemia Pertinent negatives include no chest pain, myalgias or shortness of breath.  She is doing well.  Working.  Stays active.  Has not been exercising as much recently due to work, but plans to restart.  No chest pain or sob.  No acid reflux or abdominal pain reported.  No bowel problems.  She has not been taking her cholesterol medication recently.  Mix up with refills.  Has not problems taking.  Discussed labs.     Past Medical History:  Diagnosis Date   Essential hypertension    History of chicken pox    Hypercholesterolemia    Left breast mass    Past Surgical History:  Procedure Laterality Date   BREAST BIOPSY Left 02/23/2020   Affirm Biopsy- X- Clip, path pending   BREAST LUMPECTOMY WITH RADIOACTIVE SEED LOCALIZATION Left 04/25/2020   Procedure: LEFT BREAST LUMPECTOMY WITH RADIOACTIVE SEED LOCALIZATION;  Surgeon: Jovita Kussmaul, MD;  Location: [place];  Service: General;  Laterality: Left;   cataract surgery     bilateral   COLONOSCOPY WITH PROPOFOL N/A 12/03/2016   Procedure: COLONOSCOPY WITH PROPOFOL;  Surgeon: Manya Silvas, MD;  Location: [facility] ENDOSCOPY;  Service: Endoscopy;  Laterality: N/A;   Family History  Problem Relation Age of Onset    Hypertension Mother    Congestive Heart Failure Mother    Colon cancer Brother    Breast cancer Neg Hx    Social History   Socioeconomic History   Marital status: Single    Spouse name: Not on file   Number of children: Not on file   Years of education: Not on file   Highest education level: Not on file  Occupational History   Not on file  Tobacco Use   Smoking status: Never   Smokeless tobacco: Never  Substance and Sexual Activity   Alcohol use: No    Alcohol/week: 0.0 standard drinks   Drug use: No   Sexual activity: Not Currently    Birth control/protection: Post-menopausal  Other Topics Concern   Not on file  Social History Narrative   Not on file   Social Determinants of Health   Financial Resource Strain: Low Risk    Difficulty of Paying Living Expenses: Not hard at all  Food Insecurity: No Food Insecurity   Worried About Charity fundraiser in the Last Year: Never true   [place] in the Last Year: Never true  Transportation Needs: No Transportation Needs   Lack of Transportation (Medical): No   Lack of Transportation (Non-Medical): No  Physical Activity: Sufficiently Active   Days of Exercise per Week: 3 days   Minutes of Exercise per Session: 60 min  Stress: No Stress Concern Present   Feeling of Stress : Not at  all  Social Connections: Unknown   Frequency of Communication with Friends and Family: Not on file   Frequency of Social Gatherings with Friends and Family: More than three times a week   Attends Religious Services: More than 4 times per year   Active Member of [employer] or Organizations: Yes   Attends Archivist Meetings: Not on file   Marital Status: Not on file     Review of Systems  Constitutional:  Negative for appetite change and unexpected weight change.  HENT:  Negative for congestion and sinus pressure.   Respiratory:  Negative for cough, chest tightness and shortness of breath.   Cardiovascular:  Negative for chest pain,  palpitations and leg swelling.  Gastrointestinal:  Negative for abdominal pain, diarrhea, nausea and vomiting.  Genitourinary:  Negative for difficulty urinating and dysuria.  Musculoskeletal:  Negative for joint swelling and myalgias.  Skin:  Negative for color change and rash.  Neurological:  Negative for dizziness, light-headedness and headaches.  Psychiatric/Behavioral:  Negative for agitation and dysphoric mood.       Objective:     BP 138/78    Pulse 76    Temp 98 F (36.7 C)    Resp 16    Ht 5' 3"  (1.6 m)    Wt 175 lb 3.2 oz (79.5 kg)    SpO2 97%    BMI 31.04 kg/m  Wt Readings from Last 3 Encounters:  10/11/21 175 lb 3.2 oz (79.5 kg)  08/09/21 177 lb (80.3 kg)  06/11/21 177 lb 9.6 oz (80.6 kg)    Physical Exam Vitals reviewed.  Constitutional:      General: She is not in acute distress.    Appearance: Normal appearance.  HENT:     Head: Normocephalic and atraumatic.     Right Ear: External ear normal.     Left Ear: External ear normal.  Eyes:     General: No scleral icterus.       Right eye: No discharge.        Left eye: No discharge.     Conjunctiva/sclera: Conjunctivae normal.  Neck:     Thyroid: No thyromegaly.  Cardiovascular:     Rate and Rhythm: Normal rate and regular rhythm.  Pulmonary:     Effort: No respiratory distress.     Breath sounds: Normal breath sounds. No wheezing.  Abdominal:     General: Bowel sounds are normal.     Palpations: Abdomen is soft.     Tenderness: There is no abdominal tenderness.  Musculoskeletal:        General: No swelling or tenderness.     Cervical back: Neck supple. No tenderness.  Lymphadenopathy:     Cervical: No cervical adenopathy.  Skin:    Findings: No erythema or rash.  Neurological:     Mental Status: She is alert.  Psychiatric:        Mood and Affect: Mood normal.        Behavior: Behavior normal.     Outpatient Encounter Medications as of 10/11/2021  Medication Sig   amLODipine (NORVASC) 5 MG  tablet Take 1 tablet (5 mg total) by mouth daily.   lisinopril (ZESTRIL) 20 MG tablet Take 1 tablet (20 mg total) by mouth daily.   rosuvastatin (CRESTOR) 10 MG tablet Take 1 tablet (10 mg total) by mouth daily.   [DISCONTINUED] amLODipine (NORVASC) 5 MG tablet Take 1 tablet (5 mg total) by mouth daily.   [DISCONTINUED] lisinopril (ZESTRIL) 20 MG tablet  Take 1 tablet (20 mg total) by mouth daily.   [DISCONTINUED] rosuvastatin (CRESTOR) 10 MG tablet Take 1 tablet by mouth once daily   No facility-administered encounter medications on file as of 10/11/2021.     Lab Results  Component Value Date   WBC 3.7 (L) 10/09/2021   HGB 12.8 10/09/2021   HCT 38.3 10/09/2021   PLT 265.0 10/09/2021   GLUCOSE 95 10/09/2021   CHOL 222 (H) 10/09/2021   TRIG 102.0 10/09/2021   HDL 47.20 10/09/2021   LDLCALC 154 (H) 10/09/2021   ALT 24 10/09/2021   AST 18 10/09/2021   NA 143 10/09/2021   K 3.8 10/09/2021   CL 111 10/09/2021   CREATININE 1.00 10/09/2021   BUN 21 10/09/2021   CO2 24 10/09/2021   TSH 2.00 01/24/2021   HGBA1C 6.4 10/09/2021   MICROALBUR 5.7 (H) 06/10/2021    MM 3D SCREEN BREAST BILATERAL  Result Date: 06/30/2021 CLINICAL DATA:  Screening. EXAM: DIGITAL SCREENING BILATERAL MAMMOGRAM WITH TOMOSYNTHESIS AND CAD TECHNIQUE: Bilateral screening digital craniocaudal and mediolateral oblique mammograms were obtained. Bilateral screening digital breast tomosynthesis was performed. The images were evaluated with computer-aided detection. COMPARISON:  Previous exam(s). ACR Breast Density Category b: There are scattered areas of fibroglandular density. FINDINGS: There are no findings suspicious for malignancy. IMPRESSION: No mammographic evidence of malignancy. A result letter of this screening mammogram will be mailed directly to the patient. RECOMMENDATION: Screening mammogram in one year. (Code:SM-B-01Y) BI-RADS CATEGORY  1: Negative. Electronically Signed   By: Abelardo Diesel M.D.   On: 06/30/2021  09:27      Assessment & Plan:   Problem List Items Addressed This Visit     CKD (chronic kidney disease) stage 3, GFR 30-59 ml/min (HCC)    Chronic and stable.  GFR 53 on recent check.   Recheck metabolic panel Avoid nephrotoxic meds. Continue lisinopril.       Hypercholesterolemia    Off cholesterol medication due to mix up.  Low cholesterol diet and exercise.  Restart crestor.  Follow lipid panel and liver function tests.        Relevant Medications   rosuvastatin (CRESTOR) 10 MG tablet   lisinopril (ZESTRIL) 20 MG tablet   amLODipine (NORVASC) 5 MG tablet   Hypertension, essential    Continue amlodipine and lisinopril. Remain off hctz.  Blood pressure as outlined.  Continue current medication.  Follow pressures.  Follow metabolic panel.        Relevant Medications   rosuvastatin (CRESTOR) 10 MG tablet   lisinopril (ZESTRIL) 20 MG tablet   amLODipine (NORVASC) 5 MG tablet   Leukopenia    Wbc 3.7. overall stable.  Follow cbc.       Type 2 diabetes mellitus with hyperglycemia (HCC)    Discussed labs.  A1c 6.4.  She is watching her carbohydrate intake.  Low-carb diet and exercise.  Sees [facility].  Follow met b and a1c.       Relevant Medications   rosuvastatin (CRESTOR) 10 MG tablet   lisinopril (ZESTRIL) 20 MG tablet     Einar Pheasant, MD

## 2021-10-11 NOTE — Assessment & Plan Note (Signed)
Chronic and stable.  GFR 53 on recent check.   Recheck metabolic panel Avoid nephrotoxic meds. Continue lisinopril.

## 2021-10-11 NOTE — Assessment & Plan Note (Signed)
Off cholesterol medication due to mix up.  Low cholesterol diet and exercise.  Restart crestor.  Follow lipid panel and liver function tests.

## 2021-10-11 NOTE — Assessment & Plan Note (Signed)
Wbc 3.7. overall stable.  Follow cbc.

## 2021-10-11 NOTE — Assessment & Plan Note (Signed)
Discussed labs.  A1c 6.4.  She is watching her carbohydrate intake.  Low-carb diet and exercise.  Sees [facility].  Follow met b and a1c.

## 2021-10-23 DIAGNOSIS — H524 Presbyopia: Secondary | ICD-10-CM | POA: Diagnosis not present

## 2021-10-23 DIAGNOSIS — H40023 Open angle with borderline findings, high risk, bilateral: Secondary | ICD-10-CM | POA: Diagnosis not present

## 2022-02-04 ENCOUNTER — Other Ambulatory Visit (INDEPENDENT_AMBULATORY_CARE_PROVIDER_SITE_OTHER): Payer: Medicare Other

## 2022-02-04 DIAGNOSIS — E1165 Type 2 diabetes mellitus with hyperglycemia: Secondary | ICD-10-CM

## 2022-02-04 DIAGNOSIS — D72819 Decreased white blood cell count, unspecified: Secondary | ICD-10-CM

## 2022-02-04 DIAGNOSIS — E7800 Pure hypercholesterolemia, unspecified: Secondary | ICD-10-CM | POA: Diagnosis not present

## 2022-02-04 DIAGNOSIS — I10 Essential (primary) hypertension: Secondary | ICD-10-CM | POA: Diagnosis not present

## 2022-02-04 LAB — HEPATIC FUNCTION PANEL
ALT: 21 U/L (ref 0–35)
AST: 17 U/L (ref 0–37)
Albumin: 4.2 g/dL (ref 3.5–5.2)
Alkaline Phosphatase: 68 U/L (ref 39–117)
Bilirubin, Direct: 0.1 mg/dL (ref 0.0–0.3)
Total Bilirubin: 0.4 mg/dL (ref 0.2–1.2)
Total Protein: 6 g/dL (ref 6.0–8.3)

## 2022-02-04 LAB — LIPID PANEL
Cholesterol: 137 mg/dL (ref 0–200)
HDL: 43.9 mg/dL (ref >39.00)
LDL Cholesterol: 82 mg/dL (ref 0–99)
NonHDL: 92.92
Total CHOL/HDL Ratio: 3
Triglycerides: 57 mg/dL (ref 0.0–149.0)
VLDL: 11.4 mg/dL (ref 0.0–40.0)

## 2022-02-04 LAB — CBC WITH DIFFERENTIAL/PLATELET
Basophils Absolute: 0 10*3/uL (ref 0.0–0.1)
Basophils Relative: 0.4 % (ref 0.0–3.0)
Eosinophils Absolute: 0.1 10*3/uL (ref 0.0–0.7)
Eosinophils Relative: 2.4 % (ref 0.0–5.0)
HCT: 36.7 % (ref 36.0–46.0)
Hemoglobin: 12.3 g/dL (ref 12.0–15.0)
Lymphocytes Relative: 37.7 % (ref 12.0–46.0)
Lymphs Abs: 1.4 10*3/uL (ref 0.7–4.0)
MCHC: 33.5 g/dL (ref 30.0–36.0)
MCV: 92.7 fl (ref 78.0–100.0)
Monocytes Absolute: 0.3 10*3/uL (ref 0.1–1.0)
Monocytes Relative: 7.5 % (ref 3.0–12.0)
Neutro Abs: 2 10*3/uL (ref 1.4–7.7)
Neutrophils Relative %: 52 % (ref 43.0–77.0)
Platelets: 221 10*3/uL (ref 150.0–400.0)
RBC: 3.96 Mil/uL (ref 3.87–5.11)
RDW: 15 % (ref 11.5–15.5)
WBC: 3.8 10*3/uL — ABNORMAL LOW (ref 4.0–10.5)

## 2022-02-04 LAB — BASIC METABOLIC PANEL
BUN: 32 mg/dL — ABNORMAL HIGH (ref 6–23)
CO2: 24 mEq/L (ref 19–32)
Calcium: 9.8 mg/dL (ref 8.4–10.5)
Chloride: 112 mEq/L (ref 96–112)
Creatinine, Ser: 1.16 mg/dL (ref 0.40–1.20)
GFR: 44.44 mL/min — ABNORMAL LOW (ref >60.00)
Glucose, Bld: 95 mg/dL (ref 70–99)
Potassium: 4 mEq/L (ref 3.5–5.1)
Sodium: 143 mEq/L (ref 135–145)

## 2022-02-04 LAB — HEMOGLOBIN A1C: Hgb A1c MFr Bld: 6.6 % — ABNORMAL HIGH (ref 4.6–6.5)

## 2022-02-04 LAB — TSH: TSH: 2.12 u[IU]/mL (ref 0.35–5.50)

## 2022-02-10 ENCOUNTER — Ambulatory Visit: Payer: Medicare Other | Admitting: Internal Medicine

## 2022-02-10 ENCOUNTER — Encounter: Payer: Self-pay | Admitting: Internal Medicine

## 2022-02-10 VITALS — BP 128/64 | HR 67 | Temp 97.6°F | Resp 16 | Ht 63.0 in | Wt 175.4 lb

## 2022-02-10 DIAGNOSIS — R928 Other abnormal and inconclusive findings on diagnostic imaging of breast: Secondary | ICD-10-CM | POA: Diagnosis not present

## 2022-02-10 DIAGNOSIS — I10 Essential (primary) hypertension: Secondary | ICD-10-CM | POA: Diagnosis not present

## 2022-02-10 DIAGNOSIS — E7800 Pure hypercholesterolemia, unspecified: Secondary | ICD-10-CM

## 2022-02-10 DIAGNOSIS — D72819 Decreased white blood cell count, unspecified: Secondary | ICD-10-CM

## 2022-02-10 DIAGNOSIS — N1831 Chronic kidney disease, stage 3a: Secondary | ICD-10-CM | POA: Diagnosis not present

## 2022-02-10 DIAGNOSIS — Z800 Family history of malignant neoplasm of digestive organs: Secondary | ICD-10-CM

## 2022-02-10 DIAGNOSIS — E1165 Type 2 diabetes mellitus with hyperglycemia: Secondary | ICD-10-CM

## 2022-02-10 NOTE — Progress Notes (Signed)
[name], female   DOB: 1941/08/06, 81 y.o.   MRN: [id number] ? ? ?Subjective:  ? ? [name], female    DOB: 05-Jun-1941, 81 y.o.   MRN: [id number] ? ?This visit occurred during the SARS-CoV-2 public health emergency.  Safety protocols were in place, including screening questions prior to the visit, additional usage of staff PPE, and extensive cleaning of exam room while observing appropriate contact time as indicated for disinfecting solutions.  ? ?Patient here for a scheduled follow up.  ? ?Chief Complaint  ?Patient presents with  ? Follow-up  ?  F/u for HTN, HLD  ? .  ? ?HPI ?She is doing well.  Feels good.  Stays active.  No chest pain or sob with increased activity or exertion.  No cough or congestion.  No acid reflux.  No abdominal pain.  Bowels moving.  Trying to watch her diet.  Discussed recent labs.   ? ? ?Past Medical History:  ?Diagnosis Date  ? Essential hypertension   ? History of chicken pox   ? Hypercholesterolemia   ? Left breast mass   ? ?Past Surgical History:  ?Procedure Laterality Date  ? BREAST BIOPSY Left 02/23/2020  ? Affirm Biopsy- X- Clip, path pending  ? BREAST LUMPECTOMY WITH RADIOACTIVE SEED LOCALIZATION Left 04/25/2020  ? Procedure: LEFT BREAST LUMPECTOMY WITH RADIOACTIVE SEED LOCALIZATION;  Surgeon: Jovita Kussmaul, MD;  Location: [place];  Service: General;  Laterality: Left;  ? cataract surgery    ? bilateral  ? COLONOSCOPY WITH PROPOFOL N/A 12/03/2016  ? Procedure: COLONOSCOPY WITH PROPOFOL;  Surgeon: Manya Silvas, MD;  Location: [facility] ENDOSCOPY;  Service: Endoscopy;  Laterality: N/A;  ? ?Family History  ?Problem Relation Age of Onset  ? Hypertension Mother   ? Congestive Heart Failure Mother   ? Colon cancer Brother   ? Breast cancer Neg Hx   ? ?Social History  ? ?Socioeconomic History  ? Marital status: Single  ?  Spouse name: Not on file  ? Number of children: Not on file  ? Years of education: Not on file  ? Highest education  level: Not on file  ?Occupational History  ? Not on file  ?Tobacco Use  ? Smoking status: Never  ? Smokeless tobacco: Never  ?Substance and Sexual Activity  ? Alcohol use: No  ?  Alcohol/week: 0.0 standard drinks  ? Drug use: No  ? Sexual activity: Not Currently  ?  Birth control/protection: Post-menopausal  ?Other Topics Concern  ? Not on file  ?Social History Narrative  ? Not on file  ? ?Social Determinants of Health  ? ?Financial Resource Strain: Low Risk   ? Difficulty of Paying Living Expenses: Not hard at all  ?Food Insecurity: No Food Insecurity  ? Worried About Charity fundraiser in the Last Year: Never true  ? Ran Out of Food in the Last Year: Never true  ?Transportation Needs: No Transportation Needs  ? Lack of Transportation (Medical): No  ? Lack of Transportation (Non-Medical): No  ?Physical Activity: Sufficiently Active  ? Days of Exercise per Week: 3 days  ? Minutes of Exercise per Session: 60 min  ?Stress: No Stress Concern Present  ? Feeling of Stress : Not at all  ?Social Connections: Unknown  ? Frequency of Communication with Friends and Family: Not on file  ? Frequency of Social Gatherings with Friends and Family: More than three times a week  ? Attends Religious Services: More than  4 times per year  ? Active Member of Clubs or Organizations: Yes  ? Attends Archivist Meetings: Not on file  ? Marital Status: Not on file  ? ? ? ?Review of Systems  ?Constitutional:  Negative for appetite change and unexpected weight change.  ?HENT:  Negative for congestion and sinus pressure.   ?Respiratory:  Negative for cough, chest tightness and shortness of breath.   ?Cardiovascular:  Negative for chest pain, palpitations and leg swelling.  ?Gastrointestinal:  Negative for abdominal pain, diarrhea, nausea and vomiting.  ?Genitourinary:  Negative for difficulty urinating and dysuria.  ?Musculoskeletal:  Negative for joint swelling and myalgias.  ?Skin:  Negative for color change and rash.   ?Neurological:  Negative for dizziness, light-headedness and headaches.  ?Psychiatric/Behavioral:  Negative for agitation and dysphoric mood.   ? ?   ?Objective:  ?  ? ?BP 128/64   Pulse 67   Temp 97.6 ?F (36.4 ?C) (Temporal)   Resp 16   Ht '5\' 3"'$  (1.6 m)   Wt 175 lb 6.4 oz (79.6 kg)   SpO2 99%   BMI 31.07 kg/m?  ?Wt Readings from Last 3 Encounters:  ?02/10/22 175 lb 6.4 oz (79.6 kg)  ?10/11/21 175 lb 3.2 oz (79.5 kg)  ?08/09/21 177 lb (80.3 kg)  ? ? ?Physical Exam ?Vitals reviewed.  ?Constitutional:   ?   General: She is not in acute distress. ?   Appearance: Normal appearance.  ?HENT:  ?   Head: Normocephalic and atraumatic.  ?   Right Ear: External ear normal.  ?   Left Ear: External ear normal.  ?Eyes:  ?   General: No scleral icterus.    ?   Right eye: No discharge.     ?   Left eye: No discharge.  ?   Conjunctiva/sclera: Conjunctivae normal.  ?Neck:  ?   Thyroid: No thyromegaly.  ?Cardiovascular:  ?   Rate and Rhythm: Normal rate and regular rhythm.  ?Pulmonary:  ?   Effort: No respiratory distress.  ?   Breath sounds: Normal breath sounds. No wheezing.  ?Abdominal:  ?   General: Bowel sounds are normal.  ?   Palpations: Abdomen is soft.  ?   Tenderness: There is no abdominal tenderness.  ?Musculoskeletal:     ?   General: No swelling or tenderness.  ?   Cervical back: Neck supple. No tenderness.  ?Lymphadenopathy:  ?   Cervical: No cervical adenopathy.  ?Skin: ?   Findings: No erythema or rash.  ?Neurological:  ?   Mental Status: She is alert.  ?Psychiatric:     ?   Mood and Affect: Mood normal.     ?   Behavior: Behavior normal.  ? ? ? ?Outpatient Encounter Medications as of 02/10/2022  ?Medication Sig  ? amLODipine (NORVASC) 5 MG tablet Take 1 tablet (5 mg total) by mouth daily.  ? lisinopril (ZESTRIL) 20 MG tablet Take 1 tablet (20 mg total) by mouth daily.  ? rosuvastatin (CRESTOR) 10 MG tablet Take 1 tablet (10 mg total) by mouth daily.  ? ?No facility-administered encounter medications on file as  of 02/10/2022.  ?  ? ?Lab Results  ?Component Value Date  ? WBC 3.8 (L) 02/04/2022  ? HGB 12.3 02/04/2022  ? HCT 36.7 02/04/2022  ? PLT 221.0 02/04/2022  ? GLUCOSE 95 02/04/2022  ? CHOL 137 02/04/2022  ? TRIG 57.0 02/04/2022  ? HDL 43.90 02/04/2022  ? [place] 82 02/04/2022  ? ALT 21  02/04/2022  ? AST 17 02/04/2022  ? NA 143 02/04/2022  ? K 4.0 02/04/2022  ? CL 112 02/04/2022  ? CREATININE 1.16 02/04/2022  ? BUN 32 (H) 02/04/2022  ? CO2 24 02/04/2022  ? TSH 2.12 02/04/2022  ? HGBA1C 6.6 (H) 02/04/2022  ? MICROALBUR 5.7 (H) 06/10/2021  ? ? ?MM 3D SCREEN BREAST BILATERAL ? ?Result Date: 06/30/2021 ?CLINICAL DATA:  Screening. EXAM: DIGITAL SCREENING BILATERAL MAMMOGRAM WITH TOMOSYNTHESIS AND CAD TECHNIQUE: Bilateral screening digital craniocaudal and mediolateral oblique mammograms were obtained. Bilateral screening digital breast tomosynthesis was performed. The images were evaluated with computer-aided detection. COMPARISON:  Previous exam(s). ACR Breast Density Category b: There are scattered areas of fibroglandular density. FINDINGS: There are no findings suspicious for malignancy. IMPRESSION: No mammographic evidence of malignancy. A result letter of this screening mammogram will be mailed directly to the patient. RECOMMENDATION: Screening mammogram in one year. (Code:SM-B-01Y) BI-RADS CATEGORY  1: Negative. Electronically Signed   By: Abelardo Diesel M.D.   On: 06/30/2021 09:27 ? ? ?   ?Assessment & Plan:  ? ?Problem List Items Addressed This Visit   ? ? Abnormal mammogram - Primary  ?  S/p breast biopsy and subsequent surgery - Dr Marlou Starks.  Mammogram 06/27/21 - Birads I.  ? ?  ?  ? Relevant Orders  ? MM 3D SCREEN BREAST BILATERAL  ? CKD (chronic kidney disease) stage 3, GFR 30-59 ml/min (HCC)  ?  GFR 44.4 on recent check. Stay hydrated.  Follow metabolic panel. Avoid nephrotoxic meds. Continue lisinopril.  ?  ?  ? Relevant Orders  ? Basic Metabolic Panel (BMET)  ? Family history of colon cancer  ?  Colonoscopy 2018.  ? ?   ?  ? Hypercholesterolemia  ?  Back on cholesterol medication.  Cholesterol levels improved.  Continue crestor.  Continue low cholesterol diet and exercise.  Follow lipid panel and liver function tests.   ?Lab Results  ?Comp

## 2022-02-16 ENCOUNTER — Encounter: Payer: Self-pay | Admitting: Internal Medicine

## 2022-02-16 NOTE — Assessment & Plan Note (Signed)
Back on cholesterol medication.  Cholesterol levels improved.  Continue crestor.  Continue low cholesterol diet and exercise.  Follow lipid panel and liver function tests.   ?Lab Results  ?Component Value Date  ? CHOL 137 02/04/2022  ? HDL 43.90 02/04/2022  ? [place] 82 02/04/2022  ? TRIG 57.0 02/04/2022  ? CHOLHDL 3 02/04/2022  ? ?

## 2022-02-16 NOTE — Assessment & Plan Note (Signed)
Colonoscopy 2018.  

## 2022-02-16 NOTE — Assessment & Plan Note (Signed)
GFR 44.4 on recent check. Stay hydrated.  Follow metabolic panel. Avoid nephrotoxic meds. Continue lisinopril.  ?

## 2022-02-16 NOTE — Assessment & Plan Note (Signed)
Wbc 3.8. overall stable.  Follow cbc.  ?

## 2022-02-16 NOTE — Assessment & Plan Note (Signed)
Discussed labs.  A1c 6.6.  She is watching her carbohydrate intake.  Low-carb diet and exercise.  Sees [facility].  Follow met b and a1c.  ?

## 2022-02-16 NOTE — Assessment & Plan Note (Signed)
S/p breast biopsy and subsequent surgery - Dr Marlou Starks.  Mammogram 06/27/21 - Birads I.  ?

## 2022-02-16 NOTE — Assessment & Plan Note (Signed)
Continue amlodipine and lisinopril. Remain off hctz.  Blood pressure as outlined. Recheck by me 128/64. Doing well.  Continue current medication.  Follow pressures.  Follow metabolic panel.   ?

## 2022-05-22 ENCOUNTER — Encounter: Payer: Self-pay | Admitting: Internal Medicine

## 2022-06-10 ENCOUNTER — Other Ambulatory Visit (INDEPENDENT_AMBULATORY_CARE_PROVIDER_SITE_OTHER): Payer: Medicare Other

## 2022-06-10 DIAGNOSIS — E1165 Type 2 diabetes mellitus with hyperglycemia: Secondary | ICD-10-CM | POA: Diagnosis not present

## 2022-06-10 DIAGNOSIS — D72819 Decreased white blood cell count, unspecified: Secondary | ICD-10-CM

## 2022-06-10 DIAGNOSIS — E7800 Pure hypercholesterolemia, unspecified: Secondary | ICD-10-CM | POA: Diagnosis not present

## 2022-06-10 DIAGNOSIS — N1831 Chronic kidney disease, stage 3a: Secondary | ICD-10-CM | POA: Diagnosis not present

## 2022-06-10 DIAGNOSIS — I10 Essential (primary) hypertension: Secondary | ICD-10-CM | POA: Diagnosis not present

## 2022-06-10 LAB — LIPID PANEL
Cholesterol: 131 mg/dL (ref 0–200)
HDL: 46.4 mg/dL (ref >39.00)
LDL Cholesterol: 73 mg/dL (ref 0–99)
NonHDL: 84.49
Total CHOL/HDL Ratio: 3
Triglycerides: 58 mg/dL (ref 0.0–149.0)
VLDL: 11.6 mg/dL (ref 0.0–40.0)

## 2022-06-10 LAB — HEPATIC FUNCTION PANEL
ALT: 20 U/L (ref 0–35)
AST: 18 U/L (ref 0–37)
Albumin: 3.9 g/dL (ref 3.5–5.2)
Alkaline Phosphatase: 66 U/L (ref 39–117)
Bilirubin, Direct: 0.1 mg/dL (ref 0.0–0.3)
Total Bilirubin: 0.4 mg/dL (ref 0.2–1.2)
Total Protein: 5.7 g/dL — ABNORMAL LOW (ref 6.0–8.3)

## 2022-06-10 LAB — CBC WITH DIFFERENTIAL/PLATELET
Basophils Absolute: 0 10*3/uL (ref 0.0–0.1)
Basophils Relative: 0.4 % (ref 0.0–3.0)
Eosinophils Absolute: 0.1 10*3/uL (ref 0.0–0.7)
Eosinophils Relative: 3.6 % (ref 0.0–5.0)
HCT: 35.5 % — ABNORMAL LOW (ref 36.0–46.0)
Hemoglobin: 11.8 g/dL — ABNORMAL LOW (ref 12.0–15.0)
Lymphocytes Relative: 36.9 % (ref 12.0–46.0)
Lymphs Abs: 1.2 10*3/uL (ref 0.7–4.0)
MCHC: 33.4 g/dL (ref 30.0–36.0)
MCV: 92 fl (ref 78.0–100.0)
Monocytes Absolute: 0.2 10*3/uL (ref 0.1–1.0)
Monocytes Relative: 6.4 % (ref 3.0–12.0)
Neutro Abs: 1.7 10*3/uL (ref 1.4–7.7)
Neutrophils Relative %: 52.7 % (ref 43.0–77.0)
Platelets: 241 10*3/uL (ref 150.0–400.0)
RBC: 3.86 Mil/uL — ABNORMAL LOW (ref 3.87–5.11)
RDW: 15.2 % (ref 11.5–15.5)
WBC: 3.2 10*3/uL — ABNORMAL LOW (ref 4.0–10.5)

## 2022-06-10 LAB — MICROALBUMIN / CREATININE URINE RATIO
Creatinine,U: 52.1 mg/dL
Microalb Creat Ratio: 5.8 mg/g (ref 0.0–30.0)
Microalb, Ur: 3 mg/dL — ABNORMAL HIGH (ref 0.0–1.9)

## 2022-06-10 LAB — BASIC METABOLIC PANEL
BUN: 24 mg/dL — ABNORMAL HIGH (ref 6–23)
CO2: 22 mEq/L (ref 19–32)
Calcium: 9.4 mg/dL (ref 8.4–10.5)
Chloride: 113 mEq/L — ABNORMAL HIGH (ref 96–112)
Creatinine, Ser: 1.05 mg/dL (ref 0.40–1.20)
GFR: 49.96 mL/min — ABNORMAL LOW (ref >60.00)
Glucose, Bld: 96 mg/dL (ref 70–99)
Potassium: 4.2 mEq/L (ref 3.5–5.1)
Sodium: 143 mEq/L (ref 135–145)

## 2022-06-10 LAB — HEMOGLOBIN A1C: Hgb A1c MFr Bld: 6.7 % — ABNORMAL HIGH (ref 4.6–6.5)

## 2022-06-11 ENCOUNTER — Other Ambulatory Visit: Payer: Self-pay | Admitting: Internal Medicine

## 2022-06-11 ENCOUNTER — Other Ambulatory Visit (INDEPENDENT_AMBULATORY_CARE_PROVIDER_SITE_OTHER): Payer: Medicare Other

## 2022-06-11 DIAGNOSIS — D649 Anemia, unspecified: Secondary | ICD-10-CM

## 2022-06-11 LAB — IBC + FERRITIN
Ferritin: 41.8 ng/mL (ref 10.0–291.0)
Iron: 93 ug/dL (ref 42–145)
Saturation Ratios: 21.8 % (ref 20.0–50.0)
TIBC: 427 ug/dL (ref 250.0–450.0)
Transferrin: 305 mg/dL (ref 212.0–360.0)

## 2022-06-11 LAB — VITAMIN B12: Vitamin B-12: 207 pg/mL — ABNORMAL LOW (ref 211–911)

## 2022-06-13 ENCOUNTER — Encounter: Payer: Medicare Other | Admitting: Internal Medicine

## 2022-06-13 ENCOUNTER — Telehealth: Payer: Self-pay

## 2022-06-13 NOTE — Progress Notes (Signed)
LMTCB

## 2022-06-13 NOTE — Telephone Encounter (Signed)
LMTCB

## 2022-06-20 ENCOUNTER — Telehealth: Payer: Self-pay

## 2022-06-20 NOTE — Telephone Encounter (Signed)
LMTCB for Lab Results.

## 2022-06-20 NOTE — Telephone Encounter (Signed)
-----   Message from Einar Pheasant, MD sent at 06/13/2022  8:34 AM EDT ----- Notify - hgb is slightly decreased.  Iron studies are ok.  B12 level is low.  Needs to start B12 injections - 1015mg q week x 4 weeks and then 10059m q month.  White blood cell count relatively stable.  Overall sugar control relatively stable - a1c 6.7.  continue low carb diet and exercise.  We will follow.  Kidney function stable. Cholesterol levels look good.  Liver function tests are wnl.

## 2022-06-25 NOTE — Telephone Encounter (Signed)
Lvm for pt to return call in regards to lab results. Letter created & sent to mail since it's been over 1 wk   Per Dr.Scott: Notify - hgb is slightly decreased.  Iron studies are ok.  B12 level is low.  Needs to start B12 injections - 1055mg q week x 4 weeks and then 1003m q month.  White blood cell count relatively stable.  Overall sugar control relatively stable - a1c 6.7.  continue low carb diet and exercise.  We will follow.  Kidney function stable. Cholesterol levels look good.  Liver function tests are wnl.

## 2022-06-26 NOTE — Telephone Encounter (Signed)
Patient states she is returning our call regarding her results.  I read Marcelyn Ditty Tzintzun, CMA's note to patient.  Patient states she is on break at work so she is limited on time.  We scheduled her first B12 injection appointment and she will plan to schedule the following appointments when she comes in for the first appointment.

## 2022-07-01 ENCOUNTER — Ambulatory Visit (INDEPENDENT_AMBULATORY_CARE_PROVIDER_SITE_OTHER): Payer: Medicare Other

## 2022-07-01 DIAGNOSIS — E538 Deficiency of other specified B group vitamins: Secondary | ICD-10-CM | POA: Diagnosis not present

## 2022-07-01 MED ORDER — CYANOCOBALAMIN 1000 MCG/ML IJ SOLN
1000.0000 ug | Freq: Once | INTRAMUSCULAR | Status: AC
  Administered 2022-07-01: 1000 ug via INTRAMUSCULAR

## 2022-07-01 NOTE — Progress Notes (Addendum)
Pt arrived for first B12 injection, given in R deltoid. Pt tolerated injection well, showed no signs of distress nor voiced any concerns.   Instructed pt to head to checkout to sch next wk's B12 injection. Pt verbalized understanding to instructions.

## 2022-07-08 ENCOUNTER — Ambulatory Visit (INDEPENDENT_AMBULATORY_CARE_PROVIDER_SITE_OTHER): Payer: Medicare Other

## 2022-07-08 DIAGNOSIS — E538 Deficiency of other specified B group vitamins: Secondary | ICD-10-CM

## 2022-07-08 MED ORDER — CYANOCOBALAMIN 1000 MCG/ML IJ SOLN
1000.0000 ug | Freq: Once | INTRAMUSCULAR | Status: AC
  Administered 2022-07-08: 1000 ug via INTRAMUSCULAR

## 2022-07-08 NOTE — Progress Notes (Signed)
Patient presented for B 12 injection to right deltoid, patient voiced no concerns nor showed any signs of distress during injection. 

## 2022-07-10 ENCOUNTER — Ambulatory Visit (INDEPENDENT_AMBULATORY_CARE_PROVIDER_SITE_OTHER): Payer: Medicare Other | Admitting: Internal Medicine

## 2022-07-10 ENCOUNTER — Encounter: Payer: Self-pay | Admitting: Internal Medicine

## 2022-07-10 ENCOUNTER — Other Ambulatory Visit: Payer: Self-pay

## 2022-07-10 VITALS — BP 130/78 | HR 57 | Temp 98.5°F | Ht 63.0 in | Wt 178.8 lb

## 2022-07-10 DIAGNOSIS — D72819 Decreased white blood cell count, unspecified: Secondary | ICD-10-CM

## 2022-07-10 DIAGNOSIS — N1831 Chronic kidney disease, stage 3a: Secondary | ICD-10-CM | POA: Diagnosis not present

## 2022-07-10 DIAGNOSIS — I10 Essential (primary) hypertension: Secondary | ICD-10-CM | POA: Diagnosis not present

## 2022-07-10 DIAGNOSIS — Z0000 Encounter for general adult medical examination without abnormal findings: Secondary | ICD-10-CM | POA: Diagnosis not present

## 2022-07-10 DIAGNOSIS — E7800 Pure hypercholesterolemia, unspecified: Secondary | ICD-10-CM

## 2022-07-10 DIAGNOSIS — E1165 Type 2 diabetes mellitus with hyperglycemia: Secondary | ICD-10-CM | POA: Diagnosis not present

## 2022-07-10 DIAGNOSIS — Z23 Encounter for immunization: Secondary | ICD-10-CM | POA: Diagnosis not present

## 2022-07-10 DIAGNOSIS — R928 Other abnormal and inconclusive findings on diagnostic imaging of breast: Secondary | ICD-10-CM

## 2022-07-10 LAB — BASIC METABOLIC PANEL
BUN: 22 mg/dL (ref 6–23)
CO2: 24 mEq/L (ref 19–32)
Calcium: 9.8 mg/dL (ref 8.4–10.5)
Chloride: 111 mEq/L (ref 96–112)
Creatinine, Ser: 1.03 mg/dL (ref 0.40–1.20)
GFR: 51.1 mL/min — ABNORMAL LOW (ref >60.00)
Glucose, Bld: 90 mg/dL (ref 70–99)
Potassium: 4.1 mEq/L (ref 3.5–5.1)
Sodium: 143 mEq/L (ref 135–145)

## 2022-07-10 MED ORDER — LISINOPRIL 20 MG PO TABS: 20.0000 mg | ORAL_TABLET | Freq: Every day | ORAL | 3 refills | Status: DC

## 2022-07-10 MED ORDER — ROSUVASTATIN CALCIUM 10 MG PO TABS: 10.0000 mg | ORAL_TABLET | Freq: Every day | ORAL | 2 refills | Status: DC

## 2022-07-10 MED ORDER — AMLODIPINE BESYLATE 5 MG PO TABS: 5.0000 mg | ORAL_TABLET | Freq: Every day | ORAL | 3 refills | Status: DC

## 2022-07-10 NOTE — Assessment & Plan Note (Addendum)
Physical today 07/10/22.  Mammogram 06/30/21 - Birads I.  Needs mammogram. Ordered. Colonoscopy 2018- diverticulosis.

## 2022-07-10 NOTE — Progress Notes (Signed)
[name], female   DOB: [birth year], 81 y.o.   MRN: [id number]   [name], female    DOB: [birth year], 81 y.o.   MRN: [id number]   Patient here for her physical exam.   HPI Here for her physical exam.  Reports she is doing well.  Working.  Stays active. No chest pain or sob reported. No abdominal pain.  Bowels moving.  Discussed labs.     Past Medical History:  Diagnosis Date   Essential hypertension    History of chicken pox    Hypercholesterolemia    Left breast mass    Past Surgical History:  Procedure Laterality Date   BREAST BIOPSY Left 02/23/2020   Affirm Biopsy- X- Clip, path pending   BREAST LUMPECTOMY WITH RADIOACTIVE SEED LOCALIZATION Left 04/25/2020   Procedure: LEFT BREAST LUMPECTOMY WITH RADIOACTIVE SEED LOCALIZATION;  Surgeon: Jovita Kussmaul, MD;  Location: [facility];  Service: General;  Laterality: Left;   cataract surgery     bilateral   COLONOSCOPY WITH PROPOFOL N/A 12/03/2016   Procedure: COLONOSCOPY WITH PROPOFOL;  Surgeon: Manya Silvas, MD;  Location: [facility] ENDOSCOPY;  Service: Endoscopy;  Laterality: N/A;   Family History  Problem Relation Age of Onset   Hypertension Mother    Congestive Heart Failure Mother    Colon cancer Brother    Breast cancer Neg Hx    Social History   Socioeconomic History   Marital status: Single    Spouse name: Not on file   Number of children: Not on file   Years of education: Not on file   Highest education level: Not on file  Occupational History   Not on file  Tobacco Use   Smoking status: Never   Smokeless tobacco: Never  Substance and Sexual Activity   Alcohol use: No    Alcohol/week: 0.0 standard drinks of alcohol   Drug use: No   Sexual activity: Not Currently    Birth control/protection: Post-menopausal  Other Topics Concern   Not on file  Social History Narrative   Not on file   Social Determinants of Health   Financial Resource  Strain: Low Risk  (08/09/2021)   Overall Financial Resource Strain (CARDIA)    Difficulty of Paying Living Expenses: Not hard at all  Food Insecurity: No Food Insecurity (08/09/2021)   Hunger Vital Sign    Worried About Running Out of Food in the Last Year: Never true    Ran Out of Food in the Last Year: Never true  Transportation Needs: No Transportation Needs (08/09/2021)   PRAPARE - Hydrologist (Medical): No    Lack of Transportation (Non-Medical): No  Physical Activity: Sufficiently Active (08/09/2021)   Exercise Vital Sign    Days of Exercise per Week: 3 days    Minutes of Exercise per Session: 60 min  Stress: No Stress Concern Present (08/09/2021)   [place]    Feeling of Stress : Not at all  Social Connections: Unknown (08/09/2021)   Social Connection and Isolation Panel [NHANES]    Frequency of Communication with Friends and Family: Not on file    Frequency of Social Gatherings with Friends and Family: More than three times a week    Attends Religious Services: More than 4 times per year    Active Member of [employer] or Organizations: Yes    Attends Archivist Meetings:  Not on file    Marital Status: Not on file     Review of Systems  Constitutional:  Negative for appetite change and unexpected weight change.  HENT:  Negative for congestion, sinus pressure and sore throat.   Eyes:  Negative for pain and visual disturbance.  Respiratory:  Negative for cough, chest tightness and shortness of breath.   Cardiovascular:  Negative for chest pain and palpitations.  Gastrointestinal:  Negative for abdominal pain, diarrhea, nausea and vomiting.  Genitourinary:  Negative for difficulty urinating and dysuria.  Musculoskeletal:  Negative for joint swelling and myalgias.  Skin:  Negative for color change and rash.  Neurological:  Negative for dizziness, light-headedness and  headaches.  Hematological:  Negative for adenopathy. Does not bruise/bleed easily.  Psychiatric/Behavioral:  Negative for agitation and dysphoric mood.        Objective:     BP 130/78   Pulse (!) 57   Temp 98.5 F (36.9 C) (Oral)   Ht 5' 3" (1.6 m)   Wt 178 lb 12.8 oz (81.1 kg)   SpO2 98%   BMI 31.67 kg/m  Wt Readings from Last 3 Encounters:  07/10/22 178 lb 12.8 oz (81.1 kg)  02/10/22 175 lb 6.4 oz (79.6 kg)  10/11/21 175 lb 3.2 oz (79.5 kg)    Physical Exam Vitals reviewed.  Constitutional:      General: She is not in acute distress.    Appearance: Normal appearance. She is well-developed.  HENT:     Head: Normocephalic and atraumatic.     Right Ear: External ear normal.     Left Ear: External ear normal.  Eyes:     General: No scleral icterus.       Right eye: No discharge.        Left eye: No discharge.     Conjunctiva/sclera: Conjunctivae normal.  Neck:     Thyroid: No thyromegaly.  Cardiovascular:     Rate and Rhythm: Normal rate and regular rhythm.  Pulmonary:     Effort: No tachypnea, accessory muscle usage or respiratory distress.     Breath sounds: Normal breath sounds. No decreased breath sounds or wheezing.  Chest:  Breasts:    Right: No inverted nipple, mass, nipple discharge or tenderness (no axillary adenopathy).     Left: No inverted nipple, mass, nipple discharge or tenderness (no axilarry adenopathy).  Abdominal:     General: Bowel sounds are normal.     Palpations: Abdomen is soft.     Tenderness: There is no abdominal tenderness.  Musculoskeletal:        General: No swelling or tenderness.     Cervical back: Neck supple.  Lymphadenopathy:     Cervical: No cervical adenopathy.  Skin:    Findings: No erythema or rash.  Neurological:     Mental Status: She is alert and oriented to person, place, and time.  Psychiatric:        Mood and Affect: Mood normal.        Behavior: Behavior normal.      Outpatient Encounter Medications as of  07/10/2022  Medication Sig   [DISCONTINUED] amLODipine (NORVASC) 5 MG tablet Take 1 tablet (5 mg total) by mouth daily.   [DISCONTINUED] lisinopril (ZESTRIL) 20 MG tablet Take 1 tablet (20 mg total) by mouth daily.   [DISCONTINUED] rosuvastatin (CRESTOR) 10 MG tablet Take 1 tablet (10 mg total) by mouth daily.   No facility-administered encounter medications on file as of 07/10/2022.  Lab Results  Component Value Date   WBC 3.2 (L) 06/10/2022   HGB 11.8 (L) 06/10/2022   HCT 35.5 (L) 06/10/2022   PLT 241.0 06/10/2022   GLUCOSE 90 07/10/2022   CHOL 131 06/10/2022   TRIG 58.0 06/10/2022   HDL 46.40 06/10/2022   LDLCALC 73 06/10/2022   ALT 20 06/10/2022   AST 18 06/10/2022   NA 143 07/10/2022   K 4.1 07/10/2022   CL 111 07/10/2022   CREATININE 1.03 07/10/2022   BUN 22 07/10/2022   CO2 24 07/10/2022   TSH 2.12 02/04/2022   HGBA1C 6.7 (H) 06/10/2022   MICROALBUR 3.0 (H) 06/10/2022    MM 3D SCREEN BREAST BILATERAL  Result Date: 06/30/2021 CLINICAL DATA:  Screening. EXAM: DIGITAL SCREENING BILATERAL MAMMOGRAM WITH TOMOSYNTHESIS AND CAD TECHNIQUE: Bilateral screening digital craniocaudal and mediolateral oblique mammograms were obtained. Bilateral screening digital breast tomosynthesis was performed. The images were evaluated with computer-aided detection. COMPARISON:  Previous exam(s). ACR Breast Density Category b: There are scattered areas of fibroglandular density. FINDINGS: There are no findings suspicious for malignancy. IMPRESSION: No mammographic evidence of malignancy. A result letter of this screening mammogram will be mailed directly to the patient. RECOMMENDATION: Screening mammogram in one year. (Code:SM-B-01Y) BI-RADS CATEGORY  1: Negative. Electronically Signed   By: Abelardo Diesel M.D.   On: 06/30/2021 09:27      Assessment & Plan:   Problem List Items Addressed This Visit     Abnormal mammogram    S/p breast biopsy and subsequent surgery - Dr Marlou Starks.  Mammogram 06/27/21  - Birads I.  Ordered.       CKD (chronic kidney disease) stage 3, GFR 30-59 ml/min (HCC)    Recent check - decreased GFR.  Stay hydrated. Avoid antiinflammatories.  Follow met b.       Relevant Orders   Basic metabolic panel (Completed)   Health care maintenance    Physical today 07/10/22.  Mammogram 06/30/21 - Birads I.  Needs mammogram. Ordered. Colonoscopy 2018- diverticulosis.       Hypercholesterolemia    Continue crestor.  Continue low cholesterol diet and exercise.  Follow lipid panel and liver function tests.   Lab Results  Component Value Date   CHOL 131 06/10/2022   HDL 46.40 06/10/2022   LDLCALC 73 06/10/2022   TRIG 58.0 06/10/2022   CHOLHDL 3 06/10/2022       Hypertension, essential - Primary    Continue amlodipine and lisinopril. Remain off hctz.  Blood pressure as outlined. Recheck by me 130/78.  Doing well.  Continue current medication.  Follow pressures.  Follow metabolic panel.        Leukopenia    WBC 3.2 recent check.  Relatively stable.  Follow cbc.       Type 2 diabetes mellitus with hyperglycemia (HCC)    Discussed labs. She is watching her carbohydrate intake.  Low-carb diet and exercise.  Sees [facility].  Follow met b and a1c.   Lab Results  Component Value Date   HGBA1C 6.7 (H) 06/10/2022       Relevant Orders   Basic metabolic panel (Completed)   Other Visit Diagnoses     Need for immunization against influenza       Relevant Orders   Flu Vaccine QUAD High Dose(Fluad) (Completed)        Einar Pheasant, MD

## 2022-07-11 ENCOUNTER — Telehealth: Payer: Self-pay

## 2022-07-11 NOTE — Telephone Encounter (Signed)
Lvm for pt to return call in regards to lab results.  Per Dr.Scott: Notify kidney functio is stable (actually slightly improved).  Continue to stay hydrated.

## 2022-07-14 ENCOUNTER — Telehealth: Payer: Self-pay

## 2022-07-14 NOTE — Telephone Encounter (Signed)
LMOM for pt to CB in regards to labs from Dr. Nicki Reaper:    Einar Pheasant, MD  Denita Lung A, CMA Notify kidney functio is stable (actually slightly improved).  Continue to stay hydrated.

## 2022-07-14 NOTE — Telephone Encounter (Signed)
Pt called back and I read the message to her and she verbalized understanding 

## 2022-07-14 NOTE — Telephone Encounter (Signed)
LM on pts niece phone to have her call us back in regards to pts labs

## 2022-07-14 NOTE — Telephone Encounter (Signed)
Pt niece returned call

## 2022-07-15 ENCOUNTER — Ambulatory Visit (INDEPENDENT_AMBULATORY_CARE_PROVIDER_SITE_OTHER): Payer: Medicare Other

## 2022-07-15 DIAGNOSIS — E538 Deficiency of other specified B group vitamins: Secondary | ICD-10-CM

## 2022-07-15 MED ORDER — CYANOCOBALAMIN 1000 MCG/ML IJ SOLN
1000.0000 ug | Freq: Once | INTRAMUSCULAR | Status: AC
  Administered 2022-07-15: 1000 ug via INTRAMUSCULAR

## 2022-07-15 NOTE — Progress Notes (Signed)
Pt arrived for 3/3 B12 injection, given in L deltoid. Pt tolerated injection well, showed no signs of distress nor voiced any concerns.

## 2022-07-20 ENCOUNTER — Encounter: Payer: Self-pay | Admitting: Internal Medicine

## 2022-07-20 NOTE — Assessment & Plan Note (Signed)
Discussed labs. She is watching her carbohydrate intake.  Low-carb diet and exercise.  Sees [facility].  Follow met b and a1c.   Lab Results  Component Value Date   HGBA1C 6.7 (H) 06/10/2022

## 2022-07-20 NOTE — Assessment & Plan Note (Signed)
Continue amlodipine and lisinopril. Remain off hctz.  Blood pressure as outlined. Recheck by me 130/78.  Doing well.  Continue current medication.  Follow pressures.  Follow metabolic panel.

## 2022-07-20 NOTE — Assessment & Plan Note (Signed)
Continue crestor.  Continue low cholesterol diet and exercise.  Follow lipid panel and liver function tests.   Lab Results  Component Value Date   CHOL 131 06/10/2022   HDL 46.40 06/10/2022   LDLCALC 73 06/10/2022   TRIG 58.0 06/10/2022   CHOLHDL 3 06/10/2022

## 2022-07-20 NOTE — Assessment & Plan Note (Signed)
Recent check - decreased GFR.  Stay hydrated. Avoid antiinflammatories.  Follow met b.

## 2022-07-20 NOTE — Assessment & Plan Note (Signed)
S/p breast biopsy and subsequent surgery - Dr Marlou Starks.  Mammogram 06/27/21 - Birads I.  Ordered.

## 2022-07-20 NOTE — Assessment & Plan Note (Signed)
WBC 3.2 recent check.  Relatively stable.  Follow cbc.

## 2022-08-11 ENCOUNTER — Ambulatory Visit (INDEPENDENT_AMBULATORY_CARE_PROVIDER_SITE_OTHER): Payer: Medicare Other

## 2022-08-11 VITALS — Ht 63.0 in | Wt 178.0 lb

## 2022-08-11 DIAGNOSIS — Z0000 Encounter for general adult medical examination without abnormal findings: Secondary | ICD-10-CM

## 2022-08-11 NOTE — Progress Notes (Signed)
Subjective:   Sheri Cook is a 81 y.o. female who presents for Medicare Annual (Subsequent) preventive examination.  Review of Systems    No ROS.  Medicare Wellness Virtual Visit.  Visual/audio telehealth visit, UTA vital signs.   See social history for additional risk factors.   Cardiac Risk Factors include: advanced age (>46mn, >>70women);diabetes mellitus;hypertension     Objective:    Today's Vitals   08/11/22 0838  Weight: 178 lb (80.7 kg)  Height: '5\' 3"'$  (1.6 m)   Body mass index is 31.53 kg/m.     08/11/2022    8:43 AM 08/09/2021    9:54 AM 08/08/2020    9:45 AM 04/25/2020    7:16 AM 04/18/2020    2:02 PM 08/08/2019    9:42 AM 08/02/2018    9:10 AM  Advanced Directives  Does Patient Have a Medical Advance Directive? Yes Yes Yes Yes No Yes Yes  Type of AParamedicof [place] will [place] will [place] will   [place] will Living will;Healthcare Power of Attorney  Does patient want to make changes to medical advance directive? No - Patient declined No - Patient declined No - Patient declined No - Patient declined  No - Patient declined No - Patient declined  Copy of [place] Chart? No - copy requested No - copy requested Yes - validated most recent copy scanned in chart (See row information)   Yes - validated most recent copy scanned in chart (See row information) Yes  Would patient like information on creating a medical advance directive?    No - Patient declined No - Patient declined      Current Medications (verified) Outpatient Encounter Medications as of 08/11/2022  Medication Sig   amLODipine (NORVASC) 5 MG tablet Take 1 tablet (5 mg total) by mouth daily.   lisinopril (ZESTRIL) 20 MG tablet Take 1 tablet (20 mg total) by mouth daily.   rosuvastatin (CRESTOR) 10 MG tablet Take 1 tablet (10 mg total) by mouth daily.   No  facility-administered encounter medications on file as of 08/11/2022.    Allergies (verified) Patient has no known allergies.   History: Past Medical History:  Diagnosis Date   Essential hypertension    History of chicken pox    Hypercholesterolemia    Left breast mass    Past Surgical History:  Procedure Laterality Date   BREAST BIOPSY Left 02/23/2020   Affirm Biopsy- X- Clip, path pending   BREAST LUMPECTOMY WITH RADIOACTIVE SEED LOCALIZATION Left 04/25/2020   Procedure: LEFT BREAST LUMPECTOMY WITH RADIOACTIVE SEED LOCALIZATION;  Surgeon: TJovita Kussmaul MD;  [place] [facility]  Service: General;  Laterality: Left;   cataract surgery     bilateral   COLONOSCOPY WITH PROPOFOL N/A 12/03/2016   Procedure: COLONOSCOPY WITH PROPOFOL;  Surgeon: RManya Silvas MD;  [facility]  Service: Endoscopy;  Laterality: N/A;   Family History  Problem Relation Age of Onset   Hypertension Mother    Congestive Heart Failure Mother    Colon cancer Brother    Breast cancer Neg Hx    Social History   Socioeconomic History   Marital status: Single    Spouse name: Not on file   Number of children: Not on file   Years of education: Not on file   Highest education level: Not on file  Occupational History   Not on file  Tobacco  Use   Smoking status: Never   Smokeless tobacco: Never  Substance and Sexual Activity   Alcohol use: No    Alcohol/week: 0.0 standard drinks of alcohol   Drug use: No   Sexual activity: Not Currently    Birth control/protection: Post-menopausal  Other Topics Concern   Not on file  Social History Narrative   Not on file   Social Determinants of Health   Financial Resource Strain: Low Risk  (08/11/2022)   Overall Financial Resource Strain (CARDIA)    Difficulty of Paying Living Expenses: Not hard at all  Food Insecurity: No Food Insecurity (08/11/2022)   Hunger Vital Sign    Worried About Running Out of Food in the Last Year:  Never true    Ran Out of Food in the Last Year: Never true  Transportation Needs: No Transportation Needs (08/11/2022)   PRAPARE - Hydrologist (Medical): No    Lack of Transportation (Non-Medical): No  Physical Activity: Sufficiently Active (08/11/2022)   Exercise Vital Sign    Days of Exercise per Week: 3 days    Minutes of Exercise per Session: 60 min  Stress: No Stress Concern Present (08/11/2022)   [place]    Feeling of Stress : Not at all  Social Connections: Unknown (08/11/2022)   Social Connection and Isolation Panel [NHANES]    Frequency of Communication with Friends and Family: Not on file    Frequency of Social Gatherings with Friends and Family: More than three times a week    Attends Religious Services: More than 4 times per year    Active Member of [employer] or Organizations: Yes    Attends Music therapist: Not on file    Marital Status: Not on file    Tobacco Counseling Counseling given: Not Answered   Clinical Intake:  Pre-visit preparation completed: Yes        Diabetes: Yes (Followed by PCP)  Nutrition Risk Assessment: Has the patient had any N/V/D within the last 2 months?  No  Does the patient have any non-healing wounds?  No  Has the patient had any unintentional weight loss or weight gain?  No   Diabetes: Is the patient diabetic?  Yes  If diabetic, was a CBG obtained today?  No  Did the patient bring in their glucometer from home?  No How often do you monitor your CBG's? Does not monitor at home. Followed by PCP   Financial Strains and Diabetes Management: Does the patient want to be seen by Chronic Care Management for management of their diabetes?  No  Would the patient like to be referred to a Nutritionist or for Diabetic Management?  No   Diabetic Exams:  Diabetic Eye Exam: Completed 03/2022  [facility] Diabetic Foot Exam:  Completed 06/2022 PCP    How often do you need to have someone help you when you read instructions, pamphlets, or other written materials from your doctor or pharmacy?: 1 - Never   Interpreter Needed?: No      Activities of Daily Living    08/11/2022    8:43 AM  In your present state of health, do you have any difficulty performing the following activities:  Hearing? 0  Vision? 0  Difficulty concentrating or making decisions? 0  Walking or climbing stairs? 0  Dressing or bathing? 0  Doing errands, shopping? 0  Preparing Food and eating ? N  Using the Toilet?  N  In the past six months, have you accidently leaked urine? N  Do you have problems with loss of bowel control? N  Managing your Medications? N  Managing your Finances? N  Housekeeping or managing your Housekeeping? N    Patient Care Team: Einar Pheasant, MD as PCP - General (Internal Medicine)  Indicate any recent Medical Services you may have received from other than [facility] providers in the past year (date may be approximate).     Assessment:   This is a routine wellness examination for Muse.  I connected with  [name] on 08/11/22 by a audio enabled telemedicine application and verified that I am speaking with the correct person using two identifiers.  Patient Location: Home  Provider Location: Office/Clinic  I discussed the limitations of evaluation and management by telemedicine. The patient expressed understanding and agreed to proceed.   Hearing/Vision screen Hearing Screening - Comments:: Patient is able to hear conversational tones without difficulty. No issues reported. Vision Screening - Comments:: Wears corrective lenses  Cataract extraction, bilateral  No retinopathy  Glaucoma suspect; no drops in use.  They have seen their ophthalmologist in the last 6 months.   Dietary issues and exercise activities discussed: Current Exercise Habits: Home exercise routine, Type of exercise: walking,  Time (Minutes): 60, Frequency (Times/Week): 3, Weekly Exercise (Minutes/Week): 180, Intensity: Mild She tried to have a healthy low carb diet.  Good fluid intake.    Goals Addressed               This Visit's Progress     Patient Stated     I'd like to lose some weight (pt-stated)   On track     I want to walk more for exercise Portion control Weight goal 160lb       Depression Screen    08/11/2022    8:41 AM 07/10/2022    9:34 AM 02/10/2022    3:50 PM 08/09/2021    9:53 AM 08/08/2020    9:35 AM 02/09/2020    9:22 AM 08/08/2019    9:43 AM  PHQ 2/9 Scores  PHQ - 2 Score 0 0 0 0 0 0 0    Fall Risk    08/11/2022    8:43 AM 07/10/2022    9:34 AM 02/10/2022    3:50 PM 08/09/2021    9:56 AM 08/08/2020    9:39 AM  Fall Risk   Falls in the past year? 0 0 0 0 0  Number falls in past yr: 0 0   0  Injury with Fall? 0 0     Risk for fall due to : No Fall Risks No Fall Risks No Fall Risks    Follow up Falls evaluation completed Falls evaluation completed Falls evaluation completed Falls evaluation completed Falls evaluation completed    [place]: Home free of loose throw rugs in walkways, pet beds, electrical cords, etc? Yes  Adequate lighting in your home to reduce risk of falls? Yes   ASSISTIVE DEVICES UTILIZED TO PREVENT FALLS: Life alert? No  Use of a cane, walker or w/c? No  Grab bars in the bathroom? No  Shower chair or bench in shower? No  Elevated toilet seat or a handicapped toilet? No   TIMED UP AND GO: Was the test performed? No .   Cognitive Function:    07/30/2017    9:26 AM 07/28/2016    9:08 AM 07/27/2015    8:56 AM  MMSE - Mini Mental State Exam  Orientation to time '5 5 5  '$ Orientation to Place '5 5 5  '$ Registration '3 3 3  '$ Attention/ Calculation '4 5 5  '$ Attention/Calculation-comments Difficulty subtracting simple calculations    Recall '3 2 3  '$ Language- name 2 objects '2 2 2  '$ Language- repeat '1 1 1  '$ Language-  follow 3 step command '3 3 3  '$ Language- read & follow direction '1 1 1  '$ Write a sentence '1 1 1  '$ Copy design '1 1 1  '$ Total score '29 29 30        '$ 08/11/2022    8:47 AM 08/09/2021   10:03 AM 08/08/2020    9:48 AM 08/08/2019    9:49 AM 08/02/2018    9:12 AM  6CIT Screen  What Year? 0 points 0 points 0 points 0 points 0 points  What month? 0 points 0 points 0 points 0 points 0 points  What time? 0 points 0 points  0 points 0 points  Count back from 20 0 points 0 points  0 points 0 points  Months in reverse 0 points 0 points 0 points 0 points 0 points  Repeat phrase 0 points 0 points   0 points  Total Score 0 points 0 points   0 points    Immunizations Immunization History  Administered Date(s) Administered   Fluad Quad(high Dose 65+) 08/08/2019, 08/03/2020, 08/13/2021, 07/10/2022   Influenza, High Dose Seasonal PF 07/28/2016, 07/30/2017, 08/02/2018   Influenza-Unspecified 08/08/2014, 07/13/2015   Moderna SARS-COV2 Booster Vaccination 08/17/2020   Moderna Sars-Covid-2 Vaccination 01/26/2020, 02/16/2020   PFIZER(Purple Top)SARS-COV-2 Vaccination 10/04/2020   Pneumococcal Conjugate-13 07/27/2015   Pneumococcal Polysaccharide-23 12/15/2016   Zoster Recombinat (Shingrix) 09/21/2019, 12/30/2019   TDAP status: Due, Education has been provided regarding the importance of this vaccine. Advised may receive this vaccine at local pharmacy or Health Dept. Aware to provide a copy of the vaccination record if obtained from local pharmacy or Health Dept. Verbalized acceptance and understanding.  Covid-19 vaccine status: Completed vaccines x3  Screening Tests Health Maintenance  Topic Date Due   MAMMOGRAM  06/27/2022   COVID-19 Vaccine (4 - Moderna series) 08/27/2022 (Originally 11/29/2020)   TETANUS/TDAP  08/12/2023 (Originally 04/22/1960)   HEMOGLOBIN A1C  12/11/2022   OPHTHALMOLOGY EXAM  04/26/2023   Diabetic kidney evaluation - Urine ACR  06/11/2023   Diabetic kidney evaluation - GFR  measurement  07/11/2023   FOOT EXAM  07/11/2023   Pneumonia Vaccine 78+ Years old  Completed   INFLUENZA VACCINE  Completed   DEXA SCAN  Completed   Zoster Vaccines- Shingrix  Completed   HPV VACCINES  Aged Out   Health Maintenance Health Maintenance Due  Topic Date Due   MAMMOGRAM  06/27/2022   Lung Cancer Screening: (Low Dose CT Chest recommended if Age 67-80 years, 30 pack-year currently smoking OR have quit w/in 15years.) does not qualify.   Hepatitis C Screening: does not qualify.  Vision Screening: Recommended annual ophthalmology exams for early detection of glaucoma and other disorders of the eye.  Dental Screening: Recommended annual dental exams for proper oral hygiene  Community Resource Referral / Chronic Care Management: CRR required this visit?  No   CCM required this visit?  No      Plan:     I have personally reviewed and noted the following in the patient's chart:   Medical and social history Use of alcohol, tobacco or illicit drugs  Current medications and supplements including opioid prescriptions. Patient  is not currently taking opioid prescriptions. Functional ability and status Nutritional status Physical activity Advanced directives List of other physicians Hospitalizations, surgeries, and ER visits in previous 12 months Vitals Screenings to include cognitive, depression, and falls Referrals and appointments  In addition, I have reviewed and discussed with patient certain preventive protocols, quality metrics, and best practice recommendations. A written personalized care plan for preventive services as well as general preventive health recommendations were provided to patient.     Varney Biles, LPN   [id number]/83/[id number]

## 2022-08-11 NOTE — Patient Instructions (Addendum)
Sheri Cook , Thank you for taking time to come for your Medicare Wellness Visit. I appreciate your ongoing commitment to your health goals. Please review the following plan we discussed and let me know if I can assist you in the future.   These are the goals we discussed:  Goals       Patient Stated     I'd like to lose some weight (pt-stated)      I want to walk more for exercise Portion control Weight goal 160lb        This is a list of the screening recommended for you and due dates:  Health Maintenance  Topic Date Due   Mammogram  06/27/2022   COVID-19 Vaccine (4 - Moderna series) 08/27/2022*   Tetanus Vaccine  08/12/2023*   Hemoglobin A1C  12/11/2022   Eye exam for diabetics  04/26/2023   Yearly kidney health urinalysis for diabetes  06/11/2023   Yearly kidney function blood test for diabetes  07/11/2023   Complete foot exam   07/11/2023   Pneumonia Vaccine  Completed   Flu Shot  Completed   DEXA scan (bone density measurement)  Completed   Zoster (Shingles) Vaccine  Completed   HPV Vaccine  Aged Out  *Topic was postponed. The date shown is not the original due date.    Advanced directives: End of life planning; Advance aging; Advanced directives discussed.  Copy of current HCPOA/Living Will requested.    Conditions/risks identified: none new  Next appointment: Follow up in one year for your annual wellness visit    Preventive Care 65 Years and Older, Female Preventive care refers to lifestyle choices and visits with your health care provider that can promote health and wellness. What does preventive care include? A yearly physical exam. This is also called an annual well check. Dental exams once or twice a year. Routine eye exams. Ask your health care provider how often you should have your eyes checked. Personal lifestyle choices, including: Daily care of your teeth and gums. Regular physical activity. Eating a healthy diet. Avoiding tobacco and drug  use. Limiting alcohol use. Practicing safe sex. Taking low-dose aspirin every day. Taking vitamin and mineral supplements as recommended by your health care provider. What happens during an annual well check? The services and screenings done by your health care provider during your annual well check will depend on your age, overall health, lifestyle risk factors, and family history of disease. Counseling  Your health care provider may ask you questions about your: Alcohol use. Tobacco use. Drug use. Emotional well-being. Home and relationship well-being. Sexual activity. Eating habits. History of falls. Memory and ability to understand (cognition). Work and work Statistician. Reproductive health. Screening  You may have the following tests or measurements: Height, weight, and BMI. Blood pressure. Lipid and cholesterol levels. These may be checked every 5 years, or more frequently if you are over 41 years old. Skin check. Lung cancer screening. You may have this screening every year starting at age 22 if you have a 30-pack-year history of smoking and currently smoke or have quit within the past 15 years. Fecal occult blood test (FOBT) of the stool. You may have this test every year starting at age 12. Flexible sigmoidoscopy or colonoscopy. You may have a sigmoidoscopy every 5 years or a colonoscopy every 10 years starting at age 72. Hepatitis C blood test. Hepatitis B blood test. Sexually transmitted disease (STD) testing. Diabetes screening. This is done by checking your blood sugar (  glucose) after you have not eaten for a while (fasting). You may have this done every 1-3 years. Bone density scan. This is done to screen for osteoporosis. You may have this done starting at age 64. Mammogram. This may be done every 1-2 years. Talk to your health care provider about how often you should have regular mammograms. Talk with your health care provider about your test results, treatment  options, and if necessary, the need for more tests. Vaccines  Your health care provider may recommend certain vaccines, such as: Influenza vaccine. This is recommended every year. Tetanus, diphtheria, and acellular pertussis (Tdap, Td) vaccine. You may need a Td booster every 10 years. Zoster vaccine. You may need this after age 50. Pneumococcal 13-valent conjugate (PCV13) vaccine. One dose is recommended after age 11. Pneumococcal polysaccharide (PPSV23) vaccine. One dose is recommended after age 49. Talk to your health care provider about which screenings and vaccines you need and how often you need them. This information is not intended to replace advice given to you by your health care provider. Make sure you discuss any questions you have with your health care provider. Document Released: 11/09/2015 Document Revised: 07/02/2016 Document Reviewed: 08/14/2015 Elsevier Interactive Patient Education  2017 [place] Prevention in the Home Falls can cause injuries. They can happen to people of all ages. There are many things you can do to make your home safe and to help prevent falls. What can I do on the outside of my home? Regularly fix the edges of walkways and driveways and fix any cracks. Remove anything that might make you trip as you walk through a door, such as a raised step or threshold. Trim any bushes or trees on the path to your home. Use bright outdoor lighting. Clear any walking paths of anything that might make someone trip, such as rocks or tools. Regularly check to see if handrails are loose or broken. Make sure that both sides of any steps have handrails. Any raised decks and porches should have guardrails on the edges. Have any leaves, snow, or ice cleared regularly. Use sand or salt on walking paths during winter. Clean up any spills in your garage right away. This includes oil or grease spills. What can I do in the bathroom? Use night lights. Install grab  bars by the toilet and in the tub and shower. Do not use towel bars as grab bars. Use non-skid mats or decals in the tub or shower. If you need to sit down in the shower, use a plastic, non-slip stool. Keep the floor dry. Clean up any water that spills on the floor as soon as it happens. Remove soap buildup in the tub or shower regularly. Attach bath mats securely with double-sided non-slip rug tape. Do not have throw rugs and other things on the floor that can make you trip. What can I do in the bedroom? Use night lights. Make sure that you have a light by your bed that is easy to reach. Do not use any sheets or blankets that are too big for your bed. They should not hang down onto the floor. Have a firm chair that has side arms. You can use this for support while you get dressed. Do not have throw rugs and other things on the floor that can make you trip. What can I do in the kitchen? Clean up any spills right away. Avoid walking on wet floors. Keep items that you use a lot in easy-to-reach places.  If you need to reach something above you, use a strong step stool that has a grab bar. Keep electrical cords out of the way. Do not use floor polish or wax that makes floors slippery. If you must use wax, use non-skid floor wax. Do not have throw rugs and other things on the floor that can make you trip. What can I do with my stairs? Do not leave any items on the stairs. Make sure that there are handrails on both sides of the stairs and use them. Fix handrails that are broken or loose. Make sure that handrails are as long as the stairways. Check any carpeting to make sure that it is firmly attached to the stairs. Fix any carpet that is loose or worn. Avoid having throw rugs at the top or bottom of the stairs. If you do have throw rugs, attach them to the floor with carpet tape. Make sure that you have a light switch at the top of the stairs and the bottom of the stairs. If you do not have them,  ask someone to add them for you. What else can I do to help prevent falls? Wear shoes that: Do not have high heels. Have rubber bottoms. Are comfortable and fit you well. Are closed at the toe. Do not wear sandals. If you use a stepladder: Make sure that it is fully opened. Do not climb a closed stepladder. Make sure that both sides of the stepladder are locked into place. Ask someone to hold it for you, if possible. Clearly mark and make sure that you can see: Any grab bars or handrails. First and last steps. Where the edge of each step is. Use tools that help you move around (mobility aids) if they are needed. These include: Canes. Walkers. Scooters. Crutches. Turn on the lights when you go into a dark area. Replace any light bulbs as soon as they burn out. Set up your furniture so you have a clear path. Avoid moving your furniture around. If any of your floors are uneven, fix them. If there are any pets around you, be aware of where they are. Review your medicines with your doctor. Some medicines can make you feel dizzy. This can increase your chance of falling. Ask your doctor what other things that you can do to help prevent falls. This information is not intended to replace advice given to you by your health care provider. Make sure you discuss any questions you have with your health care provider. Document Released: 08/09/2009 Document Revised: 03/20/2016 Document Reviewed: 11/17/2014 Elsevier Interactive Patient Education  2017 [employer].

## 2022-08-15 ENCOUNTER — Ambulatory Visit (INDEPENDENT_AMBULATORY_CARE_PROVIDER_SITE_OTHER): Payer: Medicare Other

## 2022-08-15 DIAGNOSIS — E538 Deficiency of other specified B group vitamins: Secondary | ICD-10-CM

## 2022-08-15 MED ORDER — CYANOCOBALAMIN 1000 MCG/ML IJ SOLN
1000.0000 ug | Freq: Once | INTRAMUSCULAR | Status: AC
  Administered 2022-08-15: 1000 ug via INTRAMUSCULAR

## 2022-08-15 MED ORDER — CYANOCOBALAMIN 1000 MCG/ML IJ SOLN: 1000.0000 ug | Freq: Once | INTRAMUSCULAR | Status: DC

## 2022-08-15 NOTE — Progress Notes (Signed)
Patient arrived for B12 injection. Given B12 in Right arm. Patient tolerated B12 injection well. Patient did not show any signs of distress or voice any concerns.

## 2022-08-15 NOTE — Addendum Note (Signed)
Addended by: Jeralyn Bennett A on: 08/15/2022 09:00 AM   Modules accepted: Orders

## 2022-09-09 ENCOUNTER — Ambulatory Visit
Admission: RE | Admit: 2022-09-09 | Discharge: 2022-09-09 | Disposition: A | Discharge status: Home / Self Care | Payer: Medicare Other | Source: Ambulatory Visit | Attending: Internal Medicine | Admitting: Internal Medicine

## 2022-09-09 DIAGNOSIS — R928 Other abnormal and inconclusive findings on diagnostic imaging of breast: Secondary | ICD-10-CM

## 2022-09-09 DIAGNOSIS — I2510 Atherosclerotic heart disease of native coronary artery without angina pectoris: Secondary | ICD-10-CM | POA: Diagnosis not present

## 2022-09-09 DIAGNOSIS — Z1231 Encounter for screening mammogram for malignant neoplasm of breast: Secondary | ICD-10-CM | POA: Diagnosis not present

## 2022-09-16 ENCOUNTER — Ambulatory Visit (INDEPENDENT_AMBULATORY_CARE_PROVIDER_SITE_OTHER): Payer: Medicare Other

## 2022-09-16 DIAGNOSIS — E538 Deficiency of other specified B group vitamins: Secondary | ICD-10-CM | POA: Diagnosis not present

## 2022-09-16 MED ORDER — CYANOCOBALAMIN 1000 MCG/ML IJ SOLN
1000.0000 ug | Freq: Once | INTRAMUSCULAR | Status: AC
  Administered 2022-09-16: 1000 ug via INTRAMUSCULAR

## 2022-09-16 NOTE — Progress Notes (Signed)
Pt presented for their vitamin B12 injection. Pt was identified through two identifiers. Pt tolerated shot well in their left deltoid.  [place], CMA

## 2022-09-29 ENCOUNTER — Telehealth: Payer: Self-pay

## 2022-09-29 MED ORDER — TELMISARTAN 20 MG PO TABS: 20.0000 mg | ORAL_TABLET | Freq: Every day | ORAL | 2 refills | Status: DC

## 2022-09-29 NOTE — Telephone Encounter (Signed)
Patient states her lisinopril (ZESTRIL) 20 MG tablet is making her joints hurt.  Patient states she would like to know if there is another medication she can take.  Patient states she stopped taking the medication this past weekend and she feels a lot better.  *Patient states her preferred pharmacy is [facility] on [address].

## 2022-09-29 NOTE — Telephone Encounter (Signed)
Discussed with pt regarding symptoms.  No rash. No lip swelling or tongue swelling.  Stopped lisinopril a few days ago.  Joints feel better.  Almost resolved.  Still taking crestor and amlodipine.  Will remain off lisinopril.  Start micardis.  Rx sent in to pharmacy. Follow pressures.  Keep appt on 10/10/22.  Call with update or problems.

## 2022-10-10 ENCOUNTER — Other Ambulatory Visit: Payer: Medicare Other

## 2022-10-10 ENCOUNTER — Ambulatory Visit: Payer: Medicare Other | Admitting: Internal Medicine

## 2022-10-16 ENCOUNTER — Encounter: Payer: Self-pay | Admitting: Internal Medicine

## 2022-10-16 ENCOUNTER — Ambulatory Visit: Payer: Medicare Other

## 2022-10-16 ENCOUNTER — Ambulatory Visit (INDEPENDENT_AMBULATORY_CARE_PROVIDER_SITE_OTHER): Payer: Medicare Other | Admitting: Internal Medicine

## 2022-10-16 VITALS — BP 154/80 | HR 62 | Temp 97.6°F | Resp 18 | Ht 63.0 in | Wt 177.4 lb

## 2022-10-16 DIAGNOSIS — E7800 Pure hypercholesterolemia, unspecified: Secondary | ICD-10-CM

## 2022-10-16 DIAGNOSIS — E538 Deficiency of other specified B group vitamins: Secondary | ICD-10-CM | POA: Diagnosis not present

## 2022-10-16 DIAGNOSIS — I10 Essential (primary) hypertension: Secondary | ICD-10-CM

## 2022-10-16 DIAGNOSIS — Z800 Family history of malignant neoplasm of digestive organs: Secondary | ICD-10-CM

## 2022-10-16 DIAGNOSIS — D649 Anemia, unspecified: Secondary | ICD-10-CM | POA: Diagnosis not present

## 2022-10-16 DIAGNOSIS — E1165 Type 2 diabetes mellitus with hyperglycemia: Secondary | ICD-10-CM | POA: Diagnosis not present

## 2022-10-16 DIAGNOSIS — N1831 Chronic kidney disease, stage 3a: Secondary | ICD-10-CM

## 2022-10-16 LAB — HEPATIC FUNCTION PANEL
ALT: 26 U/L (ref 0–35)
AST: 20 U/L (ref 0–37)
Albumin: 4.1 g/dL (ref 3.5–5.2)
Alkaline Phosphatase: 82 U/L (ref 39–117)
Bilirubin, Direct: 0.1 mg/dL (ref 0.0–0.3)
Total Bilirubin: 0.5 mg/dL (ref 0.2–1.2)
Total Protein: 6.2 g/dL (ref 6.0–8.3)

## 2022-10-16 LAB — IBC + FERRITIN
Ferritin: 33.6 ng/mL (ref 10.0–291.0)
Iron: 116 ug/dL (ref 42–145)
Saturation Ratios: 26.6 % (ref 20.0–50.0)
TIBC: 436.8 ug/dL (ref 250.0–450.0)
Transferrin: 312 mg/dL (ref 212.0–360.0)

## 2022-10-16 LAB — CBC WITH DIFFERENTIAL/PLATELET
Basophils Absolute: 0 10*3/uL (ref 0.0–0.1)
Basophils Relative: 0.4 % (ref 0.0–3.0)
Eosinophils Absolute: 0.1 10*3/uL (ref 0.0–0.7)
Eosinophils Relative: 3.3 % (ref 0.0–5.0)
HCT: 40.3 % (ref 36.0–46.0)
Hemoglobin: 13.4 g/dL (ref 12.0–15.0)
Lymphocytes Relative: 31.4 % (ref 12.0–46.0)
Lymphs Abs: 1.2 10*3/uL (ref 0.7–4.0)
MCHC: 33.3 g/dL (ref 30.0–36.0)
MCV: 91.4 fl (ref 78.0–100.0)
Monocytes Absolute: 0.2 10*3/uL (ref 0.1–1.0)
Monocytes Relative: 6.6 % (ref 3.0–12.0)
Neutro Abs: 2.2 10*3/uL (ref 1.4–7.7)
Neutrophils Relative %: 58.3 % (ref 43.0–77.0)
Platelets: 257 10*3/uL (ref 150.0–400.0)
RBC: 4.41 Mil/uL (ref 3.87–5.11)
RDW: 15.2 % (ref 11.5–15.5)
WBC: 3.7 10*3/uL — ABNORMAL LOW (ref 4.0–10.5)

## 2022-10-16 LAB — BASIC METABOLIC PANEL
BUN: 22 mg/dL (ref 6–23)
CO2: 26 mEq/L (ref 19–32)
Calcium: 9.8 mg/dL (ref 8.4–10.5)
Chloride: 110 mEq/L (ref 96–112)
Creatinine, Ser: 1.01 mg/dL (ref 0.40–1.20)
GFR: 52.22 mL/min — ABNORMAL LOW (ref >60.00)
Glucose, Bld: 101 mg/dL — ABNORMAL HIGH (ref 70–99)
Potassium: 4 mEq/L (ref 3.5–5.1)
Sodium: 143 mEq/L (ref 135–145)

## 2022-10-16 LAB — LIPID PANEL
Cholesterol: 133 mg/dL (ref 0–200)
HDL: 43.6 mg/dL (ref >39.00)
LDL Cholesterol: 77 mg/dL (ref 0–99)
NonHDL: 89.2
Total CHOL/HDL Ratio: 3
Triglycerides: 61 mg/dL (ref 0.0–149.0)
VLDL: 12.2 mg/dL (ref 0.0–40.0)

## 2022-10-16 LAB — HEMOGLOBIN A1C: Hgb A1c MFr Bld: 6.7 % — ABNORMAL HIGH (ref 4.6–6.5)

## 2022-10-16 MED ORDER — CYANOCOBALAMIN 1000 MCG/ML IJ SOLN
1000.0000 ug | Freq: Once | INTRAMUSCULAR | Status: AC
  Administered 2022-10-16: 1000 ug via INTRAMUSCULAR

## 2022-10-16 MED ORDER — TELMISARTAN 40 MG PO TABS: 40.0000 mg | ORAL_TABLET | Freq: Every day | ORAL | 3 refills | Status: DC

## 2022-10-16 NOTE — Progress Notes (Signed)
[name], female    DOB: [birth year], 81 y.o.   MRN: [id number]  Patient here for  Chief Complaint  Patient presents with   Medical Management of Chronic Issues   Hypertension   Hyperlipidemia   Diabetes    HPI Here to follow up regarding her blood pressure and cholesterol.  Recently called in with concerns regarding intolerance to lisinopril - joint aching.  Was changed to micardis.  She reports since changing the lisinopril the muscle aching has stopped.  She is having no chest pain or shortness of breath.  Tolerating the Micardis.  She stays active.  Still working.  No abdominal pain.  Bowels moving.  Blood pressure has been varying on her outside checks.   Past Medical History:  Diagnosis Date   Essential hypertension    History of chicken pox    Hypercholesterolemia    Left breast mass    Past Surgical History:  Procedure Laterality Date   BREAST BIOPSY Left 02/23/2020   Affirm Biopsy- X- Clip, path pending   BREAST LUMPECTOMY WITH RADIOACTIVE SEED LOCALIZATION Left 04/25/2020   Procedure: LEFT BREAST LUMPECTOMY WITH RADIOACTIVE SEED LOCALIZATION;  Surgeon: Jovita Kussmaul, MD;  Location: [place];  Service: General;  Laterality: Left;   cataract surgery     bilateral   COLONOSCOPY WITH PROPOFOL N/A 12/03/2016   Procedure: COLONOSCOPY WITH PROPOFOL;  Surgeon: Manya Silvas, MD;  Location: [facility] ENDOSCOPY;  Service: Endoscopy;  Laterality: N/A;   Family History  Problem Relation Age of Onset   Hypertension Mother    Congestive Heart Failure Mother    Colon cancer Brother    Breast cancer Neg Hx    Social History   Socioeconomic History   Marital status: Single    Spouse name: Not on file   Number of children: Not on file   Years of education: Not on file   Highest education level: Not on file  Occupational History   Not on file  Tobacco Use   Smoking status: Never   Smokeless tobacco: Never  Substance and  Sexual Activity   Alcohol use: No    Alcohol/week: 0.0 standard drinks of alcohol   Drug use: No   Sexual activity: Not Currently    Birth control/protection: Post-menopausal  Other Topics Concern   Not on file  Social History Narrative   Not on file   Social Determinants of Health   Financial Resource Strain: Low Risk  (08/11/2022)   Overall Financial Resource Strain (CARDIA)    Difficulty of Paying Living Expenses: Not hard at all  Food Insecurity: No Food Insecurity (08/11/2022)   Hunger Vital Sign    Worried About Running Out of Food in the Last Year: Never true    [place] in the Last Year: Never true  Transportation Needs: No Transportation Needs (08/11/2022)   PRAPARE - Hydrologist (Medical): No    Lack of Transportation (Non-Medical): No  Physical Activity: Sufficiently Active (08/11/2022)   Exercise Vital Sign    Days of Exercise per Week: 3 days    Minutes of Exercise per Session: 60 min  Stress: No Stress Concern Present (08/11/2022)   [place]    Feeling of Stress : Not at all  Social Connections: Unknown (08/11/2022)   Social Connection and Isolation Panel [NHANES]    Frequency of Communication with Friends and Family:  Not on file    Frequency of Social Gatherings with Friends and Family: More than three times a week    Attends Religious Services: More than 4 times per year    Active Member of Clubs or Organizations: Yes    Attends Archivist Meetings: Not on file    Marital Status: Not on file     Review of Systems  Constitutional:  Negative for appetite change and unexpected weight change.  HENT:  Negative for congestion and sinus pressure.   Respiratory:  Negative for cough, chest tightness and shortness of breath.   Cardiovascular:  Negative for chest pain, palpitations and leg swelling.  Gastrointestinal:  Negative for abdominal pain,  diarrhea, nausea and vomiting.  Genitourinary:  Negative for difficulty urinating and dysuria.  Musculoskeletal:  Negative for joint swelling and myalgias.  Skin:  Negative for color change and rash.  Neurological:  Negative for dizziness and headaches.  Psychiatric/Behavioral:  Negative for agitation and dysphoric mood.        Objective:     BP (!) 154/80   Pulse 62   Temp 97.6 F (36.4 C) (Temporal)   Resp 18   Ht _0  (1.6 m)   Wt 177 lb 6.4 oz (80.5 kg)   SpO2 98%   BMI 31.42 kg/m  Wt Readings from Last 3 Encounters:  10/16/22 177 lb 6.4 oz (80.5 kg)  08/11/22 178 lb (80.7 kg)  07/10/22 178 lb 12.8 oz (81.1 kg)    Physical Exam Vitals reviewed.  Constitutional:      General: She is not in acute distress.    Appearance: Normal appearance.  HENT:     Head: Normocephalic and atraumatic.     Right Ear: External ear normal.     Left Ear: External ear normal.  Eyes:     General: No scleral icterus.       Right eye: No discharge.        Left eye: No discharge.     Conjunctiva/sclera: Conjunctivae normal.  Neck:     Thyroid: No thyromegaly.  Cardiovascular:     Rate and Rhythm: Normal rate and regular rhythm.  Pulmonary:     Effort: No respiratory distress.     Breath sounds: Normal breath sounds. No wheezing.  Abdominal:     General: Bowel sounds are normal.     Palpations: Abdomen is soft.     Tenderness: There is no abdominal tenderness.  Musculoskeletal:        General: No swelling or tenderness.     Cervical back: Neck supple. No tenderness.  Lymphadenopathy:     Cervical: No cervical adenopathy.  Skin:    Findings: No erythema or rash.  Neurological:     Mental Status: She is alert.  Psychiatric:        Mood and Affect: Mood normal.        Behavior: Behavior normal.      Outpatient Encounter Medications as of 10/16/2022  Medication Sig   amLODipine (NORVASC) 5 MG tablet Take 1 tablet (5 mg total) by mouth daily.   rosuvastatin (CRESTOR) 10 MG  tablet Take 1 tablet (10 mg total) by mouth daily.   telmisartan (MICARDIS) 40 MG tablet Take 1 tablet (40 mg total) by mouth daily.   [DISCONTINUED] telmisartan (MICARDIS) 20 MG tablet Take 1 tablet (20 mg total) by mouth daily.   [EXPIRED] cyanocobalamin (VITAMIN B12) injection 1,000 mcg    No facility-administered encounter medications on file as of 10/16/2022.  Lab Results  Component Value Date   WBC 3.7 (L) 10/16/2022   HGB 13.4 10/16/2022   HCT 40.3 10/16/2022   PLT 257.0 10/16/2022   GLUCOSE 101 (H) 10/16/2022   CHOL 133 10/16/2022   TRIG 61.0 10/16/2022   HDL 43.60 10/16/2022   LDLCALC 77 10/16/2022   ALT 26 10/16/2022   AST 20 10/16/2022   NA 143 10/16/2022   K 4.0 10/16/2022   CL 110 10/16/2022   CREATININE 1.01 10/16/2022   BUN 22 10/16/2022   CO2 26 10/16/2022   TSH 2.12 02/04/2022   HGBA1C 6.7 (H) 10/16/2022   MICROALBUR 3.0 (H) 06/10/2022    MM 3D SCREEN BREAST BILATERAL  Result Date: 09/10/2022 CLINICAL DATA:  Screening. EXAM: DIGITAL SCREENING BILATERAL MAMMOGRAM WITH TOMOSYNTHESIS AND CAD TECHNIQUE: Bilateral screening digital craniocaudal and mediolateral oblique mammograms were obtained. Bilateral screening digital breast tomosynthesis was performed. The images were evaluated with computer-aided detection. COMPARISON:  Previous exam(s). ACR Breast Density Category b: There are scattered areas of fibroglandular density. FINDINGS: There are no findings suspicious for malignancy. IMPRESSION: No mammographic evidence of malignancy. A result letter of this screening mammogram will be mailed directly to the patient. RECOMMENDATION: Screening mammogram in one year. (Code:SM-B-01Y) BI-RADS CATEGORY  1: Negative. Electronically Signed   By: Everlean Alstrom M.D.   On: 09/10/2022 16:47       Assessment & Plan:  Type 2 diabetes mellitus with hyperglycemia, without long-term current use of insulin ([place]) Assessment & Plan: Discussed labs. She is watching her  carbohydrate intake.  Low-carb diet and exercise.  Sees [facility].  Follow met b and a1c.   Lab Results  Component Value Date   HGBA1C 6.7 (H) 10/16/2022    Orders: -     Hemoglobin A1c  Hypertension, essential Assessment & Plan: Continue amlodipine.  Off lisinopril due to concerns regarding intolerance.  Started on micardis 21m q day.  Blood pressures remaining elevated.  Increase micardis to 437mq day.  Remain off hctz.  Follow pressures.  Follow metabolic panel.    Orders: -     Basic metabolic panel  Hypercholesterolemia Assessment & Plan: Continue crestor.  Continue low cholesterol diet and exercise.  Follow lipid panel and liver function tests.   Lab Results  Component Value Date   CHOL 133 10/16/2022   HDL 43.60 10/16/2022   LDLCALC 77 10/16/2022   TRIG 61.0 10/16/2022   CHOLHDL 3 10/16/2022    Orders: -     Lipid panel -     Hepatic function panel  Stage 3a chronic kidney disease (HCC) Assessment & Plan: Last GFR 51.  Avoid antiinflammatories.  Stay hydrated.  Recheck metabolic panel today    Anemia, unspecified type Assessment & Plan: Follow cbc.   Orders: -     CBC with Differential/Platelet -     IBC + Ferritin  B12 deficiency -     Cyanocobalamin  Family history of colon cancer Assessment & Plan: Colonoscopy 2018.    Other orders -     Telmisartan; Take 1 tablet (40 mg total) by mouth daily.  Dispense: 30 tablet; Refill: 3     ChEinar PheasantMD

## 2022-10-16 NOTE — Patient Instructions (Signed)
Increase micardis to '40mg'$  per day.

## 2022-10-26 ENCOUNTER — Encounter: Payer: Self-pay | Admitting: Internal Medicine

## 2022-10-26 NOTE — Assessment & Plan Note (Signed)
Discussed labs. She is watching her carbohydrate intake.  Low-carb diet and exercise.  Sees [facility].  Follow met b and a1c.   Lab Results  Component Value Date   HGBA1C 6.7 (H) 10/16/2022

## 2022-10-26 NOTE — Assessment & Plan Note (Signed)
Continue crestor.  Continue low cholesterol diet and exercise.  Follow lipid panel and liver function tests.   Lab Results  Component Value Date   CHOL 133 10/16/2022   HDL 43.60 10/16/2022   LDLCALC 77 10/16/2022   TRIG 61.0 10/16/2022   CHOLHDL 3 10/16/2022

## 2022-10-26 NOTE — Assessment & Plan Note (Signed)
Last GFR 51.  Avoid antiinflammatories.  Stay hydrated.  Recheck metabolic panel today

## 2022-10-26 NOTE — Assessment & Plan Note (Signed)
Colonoscopy 2018.  

## 2022-10-26 NOTE — Assessment & Plan Note (Signed)
Follow cbc.  

## 2022-10-26 NOTE — Assessment & Plan Note (Signed)
Continue amlodipine.  Off lisinopril due to concerns regarding intolerance.  Started on micardis '10mg'$  q day.  Blood pressures remaining elevated.  Increase micardis to '40mg'$  q day.  Remain off hctz.  Follow pressures.  Follow metabolic panel.

## 2022-11-20 ENCOUNTER — Encounter: Payer: Self-pay | Admitting: Internal Medicine

## 2022-11-20 ENCOUNTER — Ambulatory Visit (INDEPENDENT_AMBULATORY_CARE_PROVIDER_SITE_OTHER): Payer: Medicare Other | Admitting: Internal Medicine

## 2022-11-20 VITALS — BP 138/74 | HR 66 | Temp 97.9°F | Resp 16 | Ht 63.0 in | Wt 172.4 lb

## 2022-11-20 DIAGNOSIS — N1831 Chronic kidney disease, stage 3a: Secondary | ICD-10-CM | POA: Diagnosis not present

## 2022-11-20 DIAGNOSIS — E1165 Type 2 diabetes mellitus with hyperglycemia: Secondary | ICD-10-CM | POA: Diagnosis not present

## 2022-11-20 DIAGNOSIS — E7800 Pure hypercholesterolemia, unspecified: Secondary | ICD-10-CM | POA: Diagnosis not present

## 2022-11-20 DIAGNOSIS — I10 Essential (primary) hypertension: Secondary | ICD-10-CM

## 2022-11-20 DIAGNOSIS — D72819 Decreased white blood cell count, unspecified: Secondary | ICD-10-CM

## 2022-11-20 DIAGNOSIS — Z800 Family history of malignant neoplasm of digestive organs: Secondary | ICD-10-CM

## 2022-11-20 NOTE — Assessment & Plan Note (Signed)
WBC has been relatively stable.  Follow cbc. Recheck with next labs.

## 2022-11-20 NOTE — Assessment & Plan Note (Signed)
Last GFR 52.  Avoid antiinflammatories.  Stay hydrated.  Follow metabolic panel.

## 2022-11-20 NOTE — Assessment & Plan Note (Signed)
Continue crestor.  Continue low cholesterol diet and exercise.  Follow lipid panel and liver function tests.   Lab Results  Component Value Date   CHOL 133 10/16/2022   HDL 43.60 10/16/2022   LDLCALC 77 10/16/2022   TRIG 61.0 10/16/2022   CHOLHDL 3 10/16/2022

## 2022-11-20 NOTE — Assessment & Plan Note (Signed)
Discussed labs. She is watching her carbohydrate intake.  Low-carb diet and exercise.  Sees [facility].  Follow met b and a1c.   Lab Results  Component Value Date   HGBA1C 6.7 (H) 10/16/2022

## 2022-11-20 NOTE — Progress Notes (Signed)
[name], female    DOB: 1941-07-14, 82 y.o.   MRN: [id number]  Patient here for  Chief Complaint  Patient presents with   Medical Management of Chronic Issues    hypertension    HPI Here to follow up regarding her blood pressure and cholesterol.  Previously had concerns regarding intolerance to lisinopril - joint aching.  Was changed to micardis. Micardis dose increased to '40mg'$  last visit.  She stays active.  Still working.  No abdominal pain.  Bowels moving.  Blood pressure has been varying on her outside checks. In reviewing her medications, it appears she is still taking the '20mg'$  micardis.  Discussed increasing micardis to '40mg'$ .  Discussed diet and exercise.    Past Medical History:  Diagnosis Date   Essential hypertension    History of chicken pox    Hypercholesterolemia    Left breast mass    Past Surgical History:  Procedure Laterality Date   BREAST BIOPSY Left 02/23/2020   Affirm Biopsy- X- Clip, path pending   BREAST LUMPECTOMY WITH RADIOACTIVE SEED LOCALIZATION Left 04/25/2020   Procedure: LEFT BREAST LUMPECTOMY WITH RADIOACTIVE SEED LOCALIZATION;  Surgeon: Jovita Kussmaul, MD;  Location: [place];  Service: General;  Laterality: Left;   cataract surgery     bilateral   COLONOSCOPY WITH PROPOFOL N/A 12/03/2016   Procedure: COLONOSCOPY WITH PROPOFOL;  Surgeon: Manya Silvas, MD;  Location: [facility] ENDOSCOPY;  Service: Endoscopy;  Laterality: N/A;   Family History  Problem Relation Age of Onset   Hypertension Mother    Congestive Heart Failure Mother    Colon cancer Brother    Breast cancer Neg Hx    Social History   Socioeconomic History   Marital status: Single    Spouse name: Not on file   Number of children: Not on file   Years of education: Not on file   Highest education level: Not on file  Occupational History   Not on file  Tobacco Use   Smoking status: Never   Smokeless tobacco: Never  Substance and  Sexual Activity   Alcohol use: No    Alcohol/week: 0.0 standard drinks of alcohol   Drug use: No   Sexual activity: Not Currently    Birth control/protection: Post-menopausal  Other Topics Concern   Not on file  Social History Narrative   Not on file   Social Determinants of Health   Financial Resource Strain: Low Risk  (08/11/2022)   Overall Financial Resource Strain (CARDIA)    Difficulty of Paying Living Expenses: Not hard at all  Food Insecurity: No Food Insecurity (08/11/2022)   Hunger Vital Sign    Worried About Running Out of Food in the Last Year: Never true    [place] in the Last Year: Never true  Transportation Needs: No Transportation Needs (08/11/2022)   PRAPARE - Hydrologist (Medical): No    Lack of Transportation (Non-Medical): No  Physical Activity: Sufficiently Active (08/11/2022)   Exercise Vital Sign    Days of Exercise per Week: 3 days    Minutes of Exercise per Session: 60 min  Stress: No Stress Concern Present (08/11/2022)   [place]    Feeling of Stress : Not at all  Social Connections: Unknown (08/11/2022)   Social Connection and Isolation Panel [NHANES]    Frequency of Communication with Friends and Family: Not on  file    Frequency of Social Gatherings with Friends and Family: More than three times a week    Attends Religious Services: More than 4 times per year    Active Member of [employer] or Organizations: Yes    Attends Archivist Meetings: Not on file    Marital Status: Not on file     Review of Systems  Constitutional:  Negative for appetite change and unexpected weight change.  HENT:  Negative for congestion and sinus pressure.   Respiratory:  Negative for cough, chest tightness and shortness of breath.   Cardiovascular:  Negative for chest pain, palpitations and leg swelling.  Gastrointestinal:  Negative for abdominal pain,  diarrhea, nausea and vomiting.  Genitourinary:  Negative for difficulty urinating and dysuria.  Musculoskeletal:  Negative for joint swelling and myalgias.  Skin:  Negative for color change and rash.  Neurological:  Negative for dizziness and headaches.  Psychiatric/Behavioral:  Negative for agitation and dysphoric mood.        Objective:     BP 138/74   Pulse 66   Temp 97.9 F (36.6 C)   Resp 16   Ht '5\' 3"'$  (1.6 m)   Wt 172 lb 6.4 oz (78.2 kg)   SpO2 98%   BMI 30.54 kg/m  Wt Readings from Last 3 Encounters:  11/20/22 172 lb 6.4 oz (78.2 kg)  10/16/22 177 lb 6.4 oz (80.5 kg)  08/11/22 178 lb (80.7 kg)    Physical Exam Vitals reviewed.  Constitutional:      General: She is not in acute distress.    Appearance: Normal appearance.  HENT:     Head: Normocephalic and atraumatic.     Right Ear: External ear normal.     Left Ear: External ear normal.  Eyes:     General: No scleral icterus.       Right eye: No discharge.        Left eye: No discharge.     Conjunctiva/sclera: Conjunctivae normal.  Neck:     Thyroid: No thyromegaly.  Cardiovascular:     Rate and Rhythm: Normal rate and regular rhythm.  Pulmonary:     Effort: No respiratory distress.     Breath sounds: Normal breath sounds. No wheezing.  Abdominal:     General: Bowel sounds are normal.     Palpations: Abdomen is soft.     Tenderness: There is no abdominal tenderness.  Musculoskeletal:        General: No swelling or tenderness.     Cervical back: Neck supple. No tenderness.  Lymphadenopathy:     Cervical: No cervical adenopathy.  Skin:    Findings: No erythema or rash.  Neurological:     Mental Status: She is alert.  Psychiatric:        Mood and Affect: Mood normal.        Behavior: Behavior normal.      Outpatient Encounter Medications as of 11/20/2022  Medication Sig   amLODipine (NORVASC) 5 MG tablet Take 1 tablet (5 mg total) by mouth daily.   rosuvastatin (CRESTOR) 10 MG tablet Take 1  tablet (10 mg total) by mouth daily.   telmisartan (MICARDIS) 40 MG tablet Take 1 tablet (40 mg total) by mouth daily.   No facility-administered encounter medications on file as of 11/20/2022.     Lab Results  Component Value Date   WBC 3.7 (L) 10/16/2022   HGB 13.4 10/16/2022   HCT 40.3 10/16/2022   PLT 257.0 10/16/2022  GLUCOSE 101 (H) 10/16/2022   CHOL 133 10/16/2022   TRIG 61.0 10/16/2022   HDL 43.60 10/16/2022   LDLCALC 77 10/16/2022   ALT 26 10/16/2022   AST 20 10/16/2022   NA 143 10/16/2022   K 4.0 10/16/2022   CL 110 10/16/2022   CREATININE 1.01 10/16/2022   BUN 22 10/16/2022   CO2 26 10/16/2022   TSH 2.12 02/04/2022   HGBA1C 6.7 (H) 10/16/2022   MICROALBUR 3.0 (H) 06/10/2022    MM 3D SCREEN BREAST BILATERAL  Result Date: 09/10/2022 CLINICAL DATA:  Screening. EXAM: DIGITAL SCREENING BILATERAL MAMMOGRAM WITH TOMOSYNTHESIS AND CAD TECHNIQUE: Bilateral screening digital craniocaudal and mediolateral oblique mammograms were obtained. Bilateral screening digital breast tomosynthesis was performed. The images were evaluated with computer-aided detection. COMPARISON:  Previous exam(s). ACR Breast Density Category b: There are scattered areas of fibroglandular density. FINDINGS: There are no findings suspicious for malignancy. IMPRESSION: No mammographic evidence of malignancy. A result letter of this screening mammogram will be mailed directly to the patient. RECOMMENDATION: Screening mammogram in one year. (Code:SM-B-01Y) BI-RADS CATEGORY  1: Negative. Electronically Signed   By: Everlean Alstrom M.D.   On: 09/10/2022 16:47       Assessment & Plan:  Type 2 diabetes mellitus with hyperglycemia, without long-term current use of insulin ([place]) Assessment & Plan: Discussed labs. She is watching her carbohydrate intake.  Low-carb diet and exercise.  Sees [facility].  Follow met b and a1c.   Lab Results  Component Value Date   HGBA1C 6.7 (H) 10/16/2022     Orders: -     Hemoglobin A1c; Future  Hypertension, essential Assessment & Plan: Continue amlodipine.  Off lisinopril due to concerns regarding intolerance. Increased micardis to '40mg'$  q day last visit.  Still taking '20mg'$ .  Increase to '40mg'$ .  Remain off hctz.  Follow pressures.  Follow metabolic panel.    Orders: -     Basic metabolic panel; Future -     CBC with Differential/Platelet; Future -     TSH; Future  Hypercholesterolemia Assessment & Plan: Continue crestor.  Continue low cholesterol diet and exercise.  Follow lipid panel and liver function tests.   Lab Results  Component Value Date   CHOL 133 10/16/2022   HDL 43.60 10/16/2022   LDLCALC 77 10/16/2022   TRIG 61.0 10/16/2022   CHOLHDL 3 10/16/2022    Orders: -     Hepatic function panel; Future -     Lipid panel; Future  Stage 3a chronic kidney disease ([place]) Assessment & Plan: Last GFR 52.  Avoid antiinflammatories.  Stay hydrated.  Follow metabolic panel.    Family history of colon cancer Assessment & Plan: Colonoscopy 2018.    Leukopenia, unspecified type Assessment & Plan: WBC has been relatively stable.  Follow cbc. Recheck with next labs.        Einar Pheasant, MD

## 2022-11-20 NOTE — Assessment & Plan Note (Signed)
Colonoscopy 2018.  

## 2022-11-20 NOTE — Assessment & Plan Note (Signed)
Continue amlodipine.  Off lisinopril due to concerns regarding intolerance. Increased micardis to '40mg'$  q day last visit.  Still taking '20mg'$ .  Increase to '40mg'$ .  Remain off hctz.  Follow pressures.  Follow metabolic panel.

## 2023-02-17 ENCOUNTER — Other Ambulatory Visit (INDEPENDENT_AMBULATORY_CARE_PROVIDER_SITE_OTHER): Payer: Medicare Other

## 2023-02-17 DIAGNOSIS — I10 Essential (primary) hypertension: Secondary | ICD-10-CM

## 2023-02-17 DIAGNOSIS — E1165 Type 2 diabetes mellitus with hyperglycemia: Secondary | ICD-10-CM | POA: Diagnosis not present

## 2023-02-17 DIAGNOSIS — E7800 Pure hypercholesterolemia, unspecified: Secondary | ICD-10-CM | POA: Diagnosis not present

## 2023-02-17 LAB — LIPID PANEL
Cholesterol: 122 mg/dL (ref 0–200)
HDL: 40.9 mg/dL (ref >39.00)
LDL Cholesterol: 68 mg/dL (ref 0–99)
NonHDL: 81.42
Total CHOL/HDL Ratio: 3
Triglycerides: 66 mg/dL (ref 0.0–149.0)
VLDL: 13.2 mg/dL (ref 0.0–40.0)

## 2023-02-17 LAB — HEPATIC FUNCTION PANEL
ALT: 34 U/L (ref 0–35)
AST: 26 U/L (ref 0–37)
Albumin: 4 g/dL (ref 3.5–5.2)
Alkaline Phosphatase: 84 U/L (ref 39–117)
Bilirubin, Direct: 0.1 mg/dL (ref 0.0–0.3)
Total Bilirubin: 0.4 mg/dL (ref 0.2–1.2)
Total Protein: 5.9 g/dL — ABNORMAL LOW (ref 6.0–8.3)

## 2023-02-17 LAB — CBC WITH DIFFERENTIAL/PLATELET
Basophils Absolute: 0 10*3/uL (ref 0.0–0.1)
Basophils Relative: 0.3 % (ref 0.0–3.0)
Eosinophils Absolute: 0.1 10*3/uL (ref 0.0–0.7)
Eosinophils Relative: 3.9 % (ref 0.0–5.0)
HCT: 37.2 % (ref 36.0–46.0)
Hemoglobin: 12.3 g/dL (ref 12.0–15.0)
Lymphocytes Relative: 34.3 % (ref 12.0–46.0)
Lymphs Abs: 1.2 10*3/uL (ref 0.7–4.0)
MCHC: 33 g/dL (ref 30.0–36.0)
MCV: 92.9 fl (ref 78.0–100.0)
Monocytes Absolute: 0.2 10*3/uL (ref 0.1–1.0)
Monocytes Relative: 7 % (ref 3.0–12.0)
Neutro Abs: 1.9 10*3/uL (ref 1.4–7.7)
Neutrophils Relative %: 54.5 % (ref 43.0–77.0)
Platelets: 273 10*3/uL (ref 150.0–400.0)
RBC: 4 Mil/uL (ref 3.87–5.11)
RDW: 15.6 % — ABNORMAL HIGH (ref 11.5–15.5)
WBC: 3.4 10*3/uL — ABNORMAL LOW (ref 4.0–10.5)

## 2023-02-17 LAB — BASIC METABOLIC PANEL
BUN: 19 mg/dL (ref 6–23)
CO2: 26 mEq/L (ref 19–32)
Calcium: 9.5 mg/dL (ref 8.4–10.5)
Chloride: 110 mEq/L (ref 96–112)
Creatinine, Ser: 1.03 mg/dL (ref 0.40–1.20)
GFR: 50.88 mL/min — ABNORMAL LOW (ref >60.00)
Glucose, Bld: 100 mg/dL — ABNORMAL HIGH (ref 70–99)
Potassium: 4.1 mEq/L (ref 3.5–5.1)
Sodium: 143 mEq/L (ref 135–145)

## 2023-02-17 LAB — TSH: TSH: 1.98 u[IU]/mL (ref 0.35–5.50)

## 2023-02-17 LAB — HEMOGLOBIN A1C: Hgb A1c MFr Bld: 6.4 % (ref 4.6–6.5)

## 2023-02-18 DIAGNOSIS — H35033 Hypertensive retinopathy, bilateral: Secondary | ICD-10-CM | POA: Diagnosis not present

## 2023-02-18 DIAGNOSIS — H40023 Open angle with borderline findings, high risk, bilateral: Secondary | ICD-10-CM | POA: Diagnosis not present

## 2023-02-18 DIAGNOSIS — H43813 Vitreous degeneration, bilateral: Secondary | ICD-10-CM | POA: Diagnosis not present

## 2023-02-18 DIAGNOSIS — H5203 Hypermetropia, bilateral: Secondary | ICD-10-CM | POA: Diagnosis not present

## 2023-02-18 LAB — HM DIABETES EYE EXAM

## 2023-02-19 ENCOUNTER — Ambulatory Visit (INDEPENDENT_AMBULATORY_CARE_PROVIDER_SITE_OTHER): Payer: Medicare Other | Admitting: Internal Medicine

## 2023-02-19 ENCOUNTER — Encounter: Payer: Self-pay | Admitting: Internal Medicine

## 2023-02-19 VITALS — BP 124/76 | HR 84 | Temp 98.2°F | Resp 16 | Ht 63.0 in | Wt 181.0 lb

## 2023-02-19 DIAGNOSIS — E1165 Type 2 diabetes mellitus with hyperglycemia: Secondary | ICD-10-CM | POA: Diagnosis not present

## 2023-02-19 DIAGNOSIS — Z800 Family history of malignant neoplasm of digestive organs: Secondary | ICD-10-CM

## 2023-02-19 DIAGNOSIS — I10 Essential (primary) hypertension: Secondary | ICD-10-CM

## 2023-02-19 DIAGNOSIS — D72819 Decreased white blood cell count, unspecified: Secondary | ICD-10-CM

## 2023-02-19 DIAGNOSIS — D649 Anemia, unspecified: Secondary | ICD-10-CM

## 2023-02-19 DIAGNOSIS — R928 Other abnormal and inconclusive findings on diagnostic imaging of breast: Secondary | ICD-10-CM

## 2023-02-19 DIAGNOSIS — E7800 Pure hypercholesterolemia, unspecified: Secondary | ICD-10-CM

## 2023-02-19 DIAGNOSIS — N1831 Chronic kidney disease, stage 3a: Secondary | ICD-10-CM

## 2023-02-19 MED ORDER — TELMISARTAN 40 MG PO TABS: 40.0000 mg | ORAL_TABLET | Freq: Every day | ORAL | 3 refills | Status: DC

## 2023-02-19 MED ORDER — ROSUVASTATIN CALCIUM 10 MG PO TABS: 10.0000 mg | ORAL_TABLET | Freq: Every day | ORAL | 3 refills | Status: DC

## 2023-02-19 MED ORDER — AMLODIPINE BESYLATE 5 MG PO TABS: 5.0000 mg | ORAL_TABLET | Freq: Every day | ORAL | 3 refills | Status: DC

## 2023-02-19 NOTE — Assessment & Plan Note (Signed)
Continue amlodipine.  Off lisinopril due to concerns regarding intolerance. Increased micardis to  q day last visit.  Remain off hctz.  Follow pressures.  Follow metabolic panel.

## 2023-02-19 NOTE — Assessment & Plan Note (Signed)
Continue crestor.  Continue low cholesterol diet and exercise.  Follow lipid panel and liver function tests.   Lab Results  Component Value Date   CHOL 122 02/17/2023   HDL 40.90 02/17/2023   LDLCALC 68 02/17/2023   TRIG 66.0 02/17/2023   CHOLHDL 3 02/17/2023

## 2023-02-19 NOTE — Progress Notes (Signed)
[name], female    DOB: November 01, 1940, 82 y.o.   MRN: [id number]  Patient here for  Chief Complaint  Patient presents with   Medical Management of Chronic Issues    HPI Here to follow up regarding hypercholesterolemia, diabetes and hypertension.  On micardis.  Increased to  last visit.  Is doing well. Stays active.  Working.  No chest pain or sob reported.  No dizziness.  No abdominal pain or cramping.  Bowels moving.   Last colonoscopy 2018.   Past Medical History:  Diagnosis Date   Essential hypertension    History of chicken pox    Hypercholesterolemia    Left breast mass    Past Surgical History:  Procedure Laterality Date   BREAST BIOPSY Left 02/23/2020   Affirm Biopsy- X- Clip, path pending   BREAST LUMPECTOMY WITH RADIOACTIVE SEED LOCALIZATION Left 04/25/2020   Procedure: LEFT BREAST LUMPECTOMY WITH RADIOACTIVE SEED LOCALIZATION;  Surgeon: Griselda Miner, MD;  Location: [facility];  Service: General;  Laterality: Left;   cataract surgery     bilateral   COLONOSCOPY WITH PROPOFOL N/A 12/03/2016   Procedure: COLONOSCOPY WITH PROPOFOL;  Surgeon: Scot Jun, MD;  Location: [facility];  Service: Endoscopy;  Laterality: N/A;   Family History  Problem Relation Age of Onset   Hypertension Mother    Congestive Heart Failure Mother    Colon cancer Brother    Breast cancer Neg Hx    Social History   Socioeconomic History   Marital status: Single    Spouse name: Not on file   Number of children: Not on file   Years of education: Not on file   Highest education level: Not on file  Occupational History   Not on file  Tobacco Use   Smoking status: Never   Smokeless tobacco: Never  Substance and Sexual Activity   Alcohol use: No    Alcohol/week: 0.0 standard drinks of alcohol   Drug use: No   Sexual activity: Not Currently    Birth control/protection: Post-menopausal  Other Topics Concern   Not on file   Social History Narrative   Not on file   Social Determinants of Health   Financial Resource Strain: Low Risk  (08/11/2022)   Overall Financial Resource Strain (CARDIA)    Difficulty of Paying Living Expenses: Not hard at all  Food Insecurity: No Food Insecurity (08/11/2022)   Hunger Vital Sign    Worried About Running Out of Food in the Last Year: Never true    Ran Out of Food in the Last Year: Never true  Transportation Needs: No Transportation Needs (08/11/2022)   PRAPARE - Administrator, Civil Service (Medical): No    Lack of Transportation (Non-Medical): No  Physical Activity: Sufficiently Active (08/11/2022)   Exercise Vital Sign    Days of Exercise per Week: 3 days    Minutes of Exercise per Session: 60 min  Stress: No Stress Concern Present (08/11/2022)   [employer] of Occupational Health - Occupational Stress Questionnaire    Feeling of Stress : Not at all  Social Connections: Unknown (08/11/2022)   Social Connection and Isolation Panel [NHANES]    Frequency of Communication with Friends and Family: Not on file    Frequency of Social Gatherings with Friends and Family: More than three times a week    Attends Religious Services: More than 4 times per year    Active Member of  Clubs or Organizations: Yes    Attends Banker Meetings: Not on file    Marital Status: Not on file     Review of Systems  Constitutional:  Negative for appetite change and unexpected weight change.  HENT:  Negative for congestion and sinus pressure.   Respiratory:  Negative for cough, chest tightness and shortness of breath.   Cardiovascular:  Negative for chest pain, palpitations and leg swelling.  Gastrointestinal:  Negative for abdominal pain, diarrhea, nausea and vomiting.  Genitourinary:  Negative for difficulty urinating and dysuria.  Musculoskeletal:  Negative for joint swelling and myalgias.  Skin:  Negative for color change and rash.  Neurological:   Negative for dizziness and headaches.  Psychiatric/Behavioral:  Negative for agitation and dysphoric mood.        Objective:     BP 124/76   Pulse 84   Temp 98.2 F (36.8 C)   Resp 16   Ht 5\' 3"  (1.6 m)   Wt 181 lb (82.1 kg)   SpO2 98%   BMI 32.06 kg/m  Wt Readings from Last 3 Encounters:  02/19/23 181 lb (82.1 kg)  11/20/22 172 lb 6.4 oz (78.2 kg)  10/16/22 177 lb 6.4 oz (80.5 kg)    Physical Exam Vitals reviewed.  Constitutional:      General: She is not in acute distress.    Appearance: Normal appearance.  HENT:     Head: Normocephalic and atraumatic.     Right Ear: External ear normal.     Left Ear: External ear normal.  Eyes:     General: No scleral icterus.       Right eye: No discharge.        Left eye: No discharge.     Conjunctiva/sclera: Conjunctivae normal.  Neck:     Thyroid: No thyromegaly.  Cardiovascular:     Rate and Rhythm: Normal rate and regular rhythm.  Pulmonary:     Effort: No respiratory distress.     Breath sounds: Normal breath sounds. No wheezing.  Abdominal:     General: Bowel sounds are normal.     Palpations: Abdomen is soft.     Tenderness: There is no abdominal tenderness.  Musculoskeletal:        General: No swelling or tenderness.     Cervical back: Neck supple. No tenderness.  Lymphadenopathy:     Cervical: No cervical adenopathy.  Skin:    Findings: No erythema or rash.  Neurological:     Mental Status: She is alert.  Psychiatric:        Mood and Affect: Mood normal.        Behavior: Behavior normal.      Outpatient Encounter Medications as of 02/19/2023  Medication Sig   amLODipine (NORVASC) 5 MG tablet Take 1 tablet (5 mg total) by mouth daily.   rosuvastatin (CRESTOR) 10 MG tablet Take 1 tablet (10 mg total) by mouth daily.   telmisartan (MICARDIS) 40 MG tablet Take 1 tablet (40 mg total) by mouth daily.   [DISCONTINUED] amLODipine (NORVASC) 5 MG tablet Take 1 tablet (5 mg total) by mouth daily.    [DISCONTINUED] rosuvastatin (CRESTOR) 10 MG tablet Take 1 tablet (10 mg total) by mouth daily.   [DISCONTINUED] telmisartan (MICARDIS) 40 MG tablet Take 1 tablet (40 mg total) by mouth daily.   No facility-administered encounter medications on file as of 02/19/2023.     Lab Results  Component Value Date   WBC 3.4 (L) 02/17/2023  HGB 12.3 02/17/2023   HCT 37.2 02/17/2023   PLT 273.0 02/17/2023   GLUCOSE 100 (H) 02/17/2023   CHOL 122 02/17/2023   TRIG 66.0 02/17/2023   HDL 40.90 02/17/2023   LDLCALC 68 02/17/2023   ALT 34 02/17/2023   AST 26 02/17/2023   NA 143 02/17/2023   K 4.1 02/17/2023   CL 110 02/17/2023   CREATININE 1.03 02/17/2023   BUN 19 02/17/2023   CO2 26 02/17/2023   TSH 1.98 02/17/2023   HGBA1C 6.4 02/17/2023   MICROALBUR 3.0 (H) 06/10/2022    MM 3D SCREEN BREAST BILATERAL  Result Date: 09/10/2022 CLINICAL DATA:  Screening. EXAM: DIGITAL SCREENING BILATERAL MAMMOGRAM WITH TOMOSYNTHESIS AND CAD TECHNIQUE: Bilateral screening digital craniocaudal and mediolateral oblique mammograms were obtained. Bilateral screening digital breast tomosynthesis was performed. The images were evaluated with computer-aided detection. COMPARISON:  Previous exam(s). ACR Breast Density Category b: There are scattered areas of fibroglandular density. FINDINGS: There are no findings suspicious for malignancy. IMPRESSION: No mammographic evidence of malignancy. A result letter of this screening mammogram will be mailed directly to the patient. RECOMMENDATION: Screening mammogram in one year. (Code:SM-B-01Y) BI-RADS CATEGORY  1: Negative. Electronically Signed   By: Edwin Cap M.D.   On: 09/10/2022 16:47       Assessment & Plan:  Type 2 diabetes mellitus with hyperglycemia, without long-term current use of insulin Assessment & Plan: Discussed labs. She is watching her carbohydrate intake.  Low-carb diet and exercise.  Sees [facility].  Had eye exam.  Negative retinopathy.  Follow met b and a1c.   Lab Results  Component Value Date   HGBA1C 6.4 02/17/2023    Orders: -     Hemoglobin A1c; Future  Hypercholesterolemia Assessment & Plan: Continue crestor.  Continue low cholesterol diet and exercise.  Follow lipid panel and liver function tests.   Lab Results  Component Value Date   CHOL 122 02/17/2023   HDL 40.90 02/17/2023   LDLCALC 68 02/17/2023   TRIG 66.0 02/17/2023   CHOLHDL 3 02/17/2023    Orders: -     Lipid panel; Future -     Hepatic function panel; Future -     Basic metabolic panel; Future  Abnormal mammogram Assessment & Plan: S/p breast biopsy and subsequent surgery - Dr Carolynne Edouard.  Mammogram 06/27/21 - Birads I.  Mammogram 09/09/22 - birads I.    Anemia, unspecified type Assessment & Plan: Follow cbc.    Stage 3a chronic kidney disease Assessment & Plan: Avoid antiinflammatories.  Stay hydrated.  Follow metabolic panel. GFR stable.    Family history of colon cancer Assessment & Plan: Colonoscopy 2018.  Per GI, no repeat needed.     Hypertension, essential Assessment & Plan: Continue amlodipine.  Off lisinopril due to concerns regarding intolerance. Increased micardis to  q day last visit.  Remain off hctz.  Follow pressures.  Follow metabolic panel.     Leukopenia, unspecified type Assessment & Plan: WBC has been relatively stable.  Follow cbc.    Other orders -     amLODIPine Besylate; Take 1 tablet (5 mg total) by mouth daily.  Dispense: 90 tablet; Refill: 3 -     Rosuvastatin Calcium; Take 1 tablet (10 mg total) by mouth daily.  Dispense: 90 tablet; Refill: 3 -     Telmisartan; Take 1 tablet (40 mg total) by mouth daily.  Dispense: 90 tablet; Refill: 3     Dale Rough and Ready, MD

## 2023-02-19 NOTE — Assessment & Plan Note (Signed)
Avoid antiinflammatories.  Stay hydrated.  Follow metabolic panel. GFR stable.

## 2023-02-19 NOTE — Assessment & Plan Note (Signed)
Colonoscopy 2018.  Per GI, no repeat needed.

## 2023-02-19 NOTE — Assessment & Plan Note (Signed)
WBC has been relatively stable.  Follow cbc.

## 2023-02-19 NOTE — Assessment & Plan Note (Signed)
S/p breast biopsy and subsequent surgery - Dr Carolynne Edouard.  Mammogram 06/27/21 - Birads I.  Mammogram 09/09/22 - birads I.

## 2023-02-19 NOTE — Assessment & Plan Note (Signed)
Follow cbc.  

## 2023-02-19 NOTE — Assessment & Plan Note (Signed)
Discussed labs. She is watching her carbohydrate intake.  Low-carb diet and exercise.  Sees [facility].  Had eye exam.  Negative retinopathy. Follow met b and a1c.   Lab Results  Component Value Date   HGBA1C 6.4 02/17/2023

## 2023-03-24 DIAGNOSIS — R058 Other specified cough: Secondary | ICD-10-CM | POA: Diagnosis not present

## 2023-06-18 ENCOUNTER — Other Ambulatory Visit (INDEPENDENT_AMBULATORY_CARE_PROVIDER_SITE_OTHER): Payer: Medicare Other

## 2023-06-18 DIAGNOSIS — E1165 Type 2 diabetes mellitus with hyperglycemia: Secondary | ICD-10-CM | POA: Diagnosis not present

## 2023-06-18 DIAGNOSIS — E7800 Pure hypercholesterolemia, unspecified: Secondary | ICD-10-CM | POA: Diagnosis not present

## 2023-06-18 LAB — LIPID PANEL
Cholesterol: 131 mg/dL (ref 0–200)
HDL: 39.3 mg/dL (ref >39.00)
LDL Cholesterol: 78 mg/dL (ref 0–99)
NonHDL: 91.46
Total CHOL/HDL Ratio: 3
Triglycerides: 67 mg/dL (ref 0.0–149.0)
VLDL: 13.4 mg/dL (ref 0.0–40.0)

## 2023-06-18 LAB — BASIC METABOLIC PANEL
BUN: 21 mg/dL (ref 6–23)
CO2: 25 mEq/L (ref 19–32)
Calcium: 9.8 mg/dL (ref 8.4–10.5)
Chloride: 110 mEq/L (ref 96–112)
Creatinine, Ser: 1.03 mg/dL (ref 0.40–1.20)
GFR: 50.76 mL/min — ABNORMAL LOW (ref >60.00)
Glucose, Bld: 98 mg/dL (ref 70–99)
Potassium: 3.9 mEq/L (ref 3.5–5.1)
Sodium: 143 mEq/L (ref 135–145)

## 2023-06-18 LAB — HEMOGLOBIN A1C: Hgb A1c MFr Bld: 6.6 % — ABNORMAL HIGH (ref 4.6–6.5)

## 2023-06-18 LAB — HEPATIC FUNCTION PANEL
ALT: 27 U/L (ref 0–35)
AST: 23 U/L (ref 0–37)
Albumin: 4.1 g/dL (ref 3.5–5.2)
Alkaline Phosphatase: 71 U/L (ref 39–117)
Bilirubin, Direct: 0.1 mg/dL (ref 0.0–0.3)
Total Bilirubin: 0.5 mg/dL (ref 0.2–1.2)
Total Protein: 6.6 g/dL (ref 6.0–8.3)

## 2023-06-24 ENCOUNTER — Ambulatory Visit (INDEPENDENT_AMBULATORY_CARE_PROVIDER_SITE_OTHER): Payer: Medicare Other | Admitting: Internal Medicine

## 2023-06-24 ENCOUNTER — Encounter: Payer: Self-pay | Admitting: Internal Medicine

## 2023-06-24 VITALS — BP 138/74 | HR 66 | Temp 98.0°F | Resp 16 | Ht 63.0 in | Wt 182.2 lb

## 2023-06-24 DIAGNOSIS — D72819 Decreased white blood cell count, unspecified: Secondary | ICD-10-CM | POA: Diagnosis not present

## 2023-06-24 DIAGNOSIS — E7800 Pure hypercholesterolemia, unspecified: Secondary | ICD-10-CM

## 2023-06-24 DIAGNOSIS — N1831 Chronic kidney disease, stage 3a: Secondary | ICD-10-CM

## 2023-06-24 DIAGNOSIS — Z23 Encounter for immunization: Secondary | ICD-10-CM

## 2023-06-24 DIAGNOSIS — E1165 Type 2 diabetes mellitus with hyperglycemia: Secondary | ICD-10-CM

## 2023-06-24 DIAGNOSIS — Z800 Family history of malignant neoplasm of digestive organs: Secondary | ICD-10-CM

## 2023-06-24 DIAGNOSIS — Z1231 Encounter for screening mammogram for malignant neoplasm of breast: Secondary | ICD-10-CM

## 2023-06-24 DIAGNOSIS — I10 Essential (primary) hypertension: Secondary | ICD-10-CM

## 2023-06-24 LAB — HM DIABETES FOOT EXAM

## 2023-06-24 NOTE — Assessment & Plan Note (Signed)
WBC has been relatively stable.  Follow cbc.

## 2023-06-24 NOTE — Assessment & Plan Note (Signed)
Continue crestor.  Continue low cholesterol diet and exercise.  Follow lipid panel and liver function tests.   Lab Results  Component Value Date   CHOL 131 06/18/2023   HDL 39.30 06/18/2023   LDLCALC 78 06/18/2023   TRIG 67.0 06/18/2023   CHOLHDL 3 06/18/2023

## 2023-06-24 NOTE — Assessment & Plan Note (Signed)
Continue amlodipine.  Off lisinopril due to concerns regarding intolerance. On micardis 40mg  q day. Remain off hctz.  Follow pressures.  Follow metabolic panel.

## 2023-06-24 NOTE — Assessment & Plan Note (Signed)
Discussed labs. She is watching her carbohydrate intake.  Low-carb diet and exercise.  Sees [facility].  Had eye exam.  Negative retinopathy. Follow met b and a1c.   Lab Results  Component Value Date   HGBA1C 6.6 (H) 06/18/2023

## 2023-06-24 NOTE — Assessment & Plan Note (Signed)
Avoid antiinflammatories.  Stay hydrated.  Follow metabolic panel. GFR stable.

## 2023-06-24 NOTE — Assessment & Plan Note (Signed)
Colonoscopy 2018.  Per GI, no repeat needed.

## 2023-06-24 NOTE — Progress Notes (Signed)
[name], female    DOB: 10-05-1941, 82 y.o.   MRN: [id number]  Patient here for  Chief Complaint  Patient presents with   Medical Management of Chronic Issues    HPI Here to follow up regarding hypercholesterolemia, diabetes and hypertension. On micardis.  Has been doing well.  Feels good.  Stays active.  No chest pain or sob reported.  No cough or congestion.  No abdominal pain or bowel change reported.  Discussed labs.  Renal function stable.  A1c 6.6.  discussed low carb diet and exercise.    Past Medical History:  Diagnosis Date   Essential hypertension    History of chicken pox    Hypercholesterolemia    Left breast mass    Past Surgical History:  Procedure Laterality Date   BREAST BIOPSY Left 02/23/2020   Affirm Biopsy- X- Clip, path pending   BREAST LUMPECTOMY WITH RADIOACTIVE SEED LOCALIZATION Left 04/25/2020   Procedure: LEFT BREAST LUMPECTOMY WITH RADIOACTIVE SEED LOCALIZATION;  Surgeon: Griselda Miner, MD;  Location: [facility];  Service: General;  Laterality: Left;   cataract surgery     bilateral   COLONOSCOPY WITH PROPOFOL N/A 12/03/2016   Procedure: COLONOSCOPY WITH PROPOFOL;  Surgeon: Scot Jun, MD;  Location: [facility];  Service: Endoscopy;  Laterality: N/A;   Family History  Problem Relation Age of Onset   Hypertension Mother    Congestive Heart Failure Mother    Colon cancer Brother    Breast cancer Neg Hx    Social History   Socioeconomic History   Marital status: Single    Spouse name: Not on file   Number of children: Not on file   Years of education: Not on file   Highest education level: Not on file  Occupational History   Not on file  Tobacco Use   Smoking status: Never   Smokeless tobacco: Never  Substance and Sexual Activity   Alcohol use: No    Alcohol/week: 0.0 standard drinks of alcohol   Drug use: No   Sexual activity: Not Currently    Birth control/protection:  Post-menopausal  Other Topics Concern   Not on file  Social History Narrative   Not on file   Social Determinants of Health   Financial Resource Strain: Low Risk  (08/11/2022)   Overall Financial Resource Strain (CARDIA)    Difficulty of Paying Living Expenses: Not hard at all  Food Insecurity: No Food Insecurity (08/11/2022)   Hunger Vital Sign    Worried About Running Out of Food in the Last Year: Never true    Ran Out of Food in the Last Year: Never true  Transportation Needs: No Transportation Needs (08/11/2022)   PRAPARE - Administrator, Civil Service (Medical): No    Lack of Transportation (Non-Medical): No  Physical Activity: Sufficiently Active (08/11/2022)   Exercise Vital Sign    Days of Exercise per Week: 3 days    Minutes of Exercise per Session: 60 min  Stress: No Stress Concern Present (08/11/2022)   [employer] of Occupational Health - Occupational Stress Questionnaire    Feeling of Stress : Not at all  Social Connections: Unknown (08/11/2022)   Social Connection and Isolation Panel [NHANES]    Frequency of Communication with Friends and Family: Not on file    Frequency of Social Gatherings with Friends and Family: More than three times a week    Attends Religious Services: More than  4 times per year    Active Member of Clubs or Organizations: Yes    Attends Banker Meetings: Not on file    Marital Status: Not on file     Review of Systems  Constitutional:  Negative for appetite change and unexpected weight change.  HENT:  Negative for congestion and sinus pressure.   Respiratory:  Negative for cough, chest tightness and shortness of breath.   Cardiovascular:  Negative for chest pain, palpitations and leg swelling.  Gastrointestinal:  Negative for abdominal pain, diarrhea, nausea and vomiting.  Genitourinary:  Negative for difficulty urinating and dysuria.  Musculoskeletal:  Negative for gait problem and joint swelling.  Skin:   Negative for color change and rash.  Neurological:  Negative for dizziness and headaches.  Psychiatric/Behavioral:  Negative for agitation and dysphoric mood.        Objective:     BP 138/74   Pulse 66   Temp 98 F (36.7 C)   Resp 16   Ht 5\' 3"  (1.6 m)   Wt 182 lb 3.2 oz (82.6 kg)   SpO2 98%   BMI 32.28 kg/m  Wt Readings from Last 3 Encounters:  06/24/23 182 lb 3.2 oz (82.6 kg)  02/19/23 181 lb (82.1 kg)  11/20/22 172 lb 6.4 oz (78.2 kg)    Physical Exam Vitals reviewed.  Constitutional:      General: She is not in acute distress.    Appearance: Normal appearance.  HENT:     Head: Normocephalic and atraumatic.     Right Ear: External ear normal.     Left Ear: External ear normal.  Eyes:     General: No scleral icterus.       Right eye: No discharge.        Left eye: No discharge.     Conjunctiva/sclera: Conjunctivae normal.  Neck:     Thyroid: No thyromegaly.  Cardiovascular:     Rate and Rhythm: Normal rate and regular rhythm.  Pulmonary:     Effort: No respiratory distress.     Breath sounds: Normal breath sounds. No wheezing.  Abdominal:     General: Bowel sounds are normal.     Palpations: Abdomen is soft.     Tenderness: There is no abdominal tenderness.  Musculoskeletal:        General: No swelling or tenderness.     Cervical back: Neck supple. No tenderness.  Lymphadenopathy:     Cervical: No cervical adenopathy.  Skin:    Findings: No erythema or rash.  Neurological:     Mental Status: She is alert.  Psychiatric:        Mood and Affect: Mood normal.        Behavior: Behavior normal.      Outpatient Encounter Medications as of 06/24/2023  Medication Sig   amLODipine (NORVASC) 5 MG tablet Take 1 tablet (5 mg total) by mouth daily.   rosuvastatin (CRESTOR) 10 MG tablet Take 1 tablet (10 mg total) by mouth daily.   telmisartan (MICARDIS) 40 MG tablet Take 1 tablet (40 mg total) by mouth daily.   No facility-administered encounter medications  on file as of 06/24/2023.     Lab Results  Component Value Date   WBC 3.4 (L) 02/17/2023   HGB 12.3 02/17/2023   HCT 37.2 02/17/2023   PLT 273.0 02/17/2023   GLUCOSE 98 06/18/2023   CHOL 131 06/18/2023   TRIG 67.0 06/18/2023   HDL 39.30 06/18/2023   LDLCALC 78 06/18/2023  ALT 27 06/18/2023   AST 23 06/18/2023   NA 143 06/18/2023   K 3.9 06/18/2023   CL 110 06/18/2023   CREATININE 1.03 06/18/2023   BUN 21 06/18/2023   CO2 25 06/18/2023   TSH 1.98 02/17/2023   HGBA1C 6.6 (H) 06/18/2023   MICROALBUR 3.0 (H) 06/10/2022    MM 3D SCREEN BREAST BILATERAL  Result Date: 09/10/2022 CLINICAL DATA:  Screening. EXAM: DIGITAL SCREENING BILATERAL MAMMOGRAM WITH TOMOSYNTHESIS AND CAD TECHNIQUE: Bilateral screening digital craniocaudal and mediolateral oblique mammograms were obtained. Bilateral screening digital breast tomosynthesis was performed. The images were evaluated with computer-aided detection. COMPARISON:  Previous exam(s). ACR Breast Density Category b: There are scattered areas of fibroglandular density. FINDINGS: There are no findings suspicious for malignancy. IMPRESSION: No mammographic evidence of malignancy. A result letter of this screening mammogram will be mailed directly to the patient. RECOMMENDATION: Screening mammogram in one year. (Code:SM-B-01Y) BI-RADS CATEGORY  1: Negative. Electronically Signed   By: Edwin Cap M.D.   On: 09/10/2022 16:47       Assessment & Plan:  Type 2 diabetes mellitus with hyperglycemia, without long-term current use of insulin (HCC) Assessment & Plan: Discussed labs. She is watching her carbohydrate intake.  Low-carb diet and exercise.  Sees [facility].  Had eye exam.  Negative retinopathy. Follow met b and a1c.   Lab Results  Component Value Date   HGBA1C 6.6 (H) 06/18/2023    Orders: -     Hemoglobin A1c; Future -     Microalbumin / creatinine urine ratio; Future  Hypercholesterolemia Assessment & Plan: Continue  crestor.  Continue low cholesterol diet and exercise.  Follow lipid panel and liver function tests.   Lab Results  Component Value Date   CHOL 131 06/18/2023   HDL 39.30 06/18/2023   LDLCALC 78 06/18/2023   TRIG 67.0 06/18/2023   CHOLHDL 3 06/18/2023    Orders: -     Lipid panel; Future -     Hepatic function panel; Future -     Basic metabolic panel; Future  Visit for screening mammogram -     3D Screening Mammogram, Left and Right; Future  Leukopenia, unspecified type Assessment & Plan: WBC has been relatively stable.  Follow cbc.    Hypertension, essential Assessment & Plan: Continue amlodipine.  Off lisinopril due to concerns regarding intolerance. On micardis 40mg  q day. Remain off hctz.  Follow pressures.  Follow metabolic panel.     Family history of colon cancer Assessment & Plan: Colonoscopy 2018.  Per GI, no repeat needed.     Stage 3a chronic kidney disease (HCC) Assessment & Plan: Avoid antiinflammatories.  Stay hydrated.  Follow metabolic panel. GFR stable.       Dale Ward, MD

## 2023-08-11 ENCOUNTER — Telehealth: Payer: Self-pay | Admitting: Internal Medicine

## 2023-08-11 NOTE — Telephone Encounter (Signed)
Copied from CRM [id number]. Topic: Medicare AWV >> Aug 11, 2023  9:15 AM Payton Doughty wrote: Reason for CRM: Called LVM 08/11/2023 to schedule Annual Wellness Visit  Verlee Rossetti; Care Guide Ambulatory Clinical Support [place] [facility] Direct Dial: [phone]

## 2023-08-19 DIAGNOSIS — H40023 Open angle with borderline findings, high risk, bilateral: Secondary | ICD-10-CM | POA: Diagnosis not present

## 2023-08-25 ENCOUNTER — Telehealth: Payer: Self-pay | Admitting: Internal Medicine

## 2023-08-25 NOTE — Telephone Encounter (Signed)
Copied from CRM [id number]. Topic: Medicare AWV >> Aug 25, 2023  9:48 AM Payton Doughty wrote: Reason for CRM: Called LVM 08/25/2023 to schedule Annual Wellness Visit  Verlee Rossetti; Care Guide Ambulatory Clinical Support [place] [facility] Direct Dial: [phone]

## 2023-09-02 ENCOUNTER — Ambulatory Visit: Payer: Medicare Other | Admitting: *Deleted

## 2023-09-02 VITALS — Ht 63.0 in | Wt 181.0 lb

## 2023-09-02 DIAGNOSIS — Z0000 Encounter for general adult medical examination without abnormal findings: Secondary | ICD-10-CM

## 2023-09-02 NOTE — Patient Instructions (Signed)
Sheri Cook , Thank you for taking time to come for your Medicare Wellness Visit. I appreciate your ongoing commitment to your health goals. Please review the following plan we discussed and let me know if I can assist you in the future.   Referrals/Orders/Follow-Ups/Clinician Recommendations: None  This is a list of the screening recommended for you and due dates:  Health Maintenance  Topic Date Due   Yearly kidney health urinalysis for diabetes  06/11/2023   COVID-19 Vaccine (5 - 2023-24 season) 06/28/2023   Mammogram  09/10/2023   Hemoglobin A1C  12/19/2023   Eye exam for diabetics  02/18/2024   Yearly kidney function blood test for diabetes  06/17/2024   Complete foot exam   06/23/2024   Medicare Annual Wellness Visit  09/01/2024   Pneumonia Vaccine  Completed   Flu Shot  Completed   DEXA scan (bone density measurement)  Completed   Zoster (Shingles) Vaccine  Completed   HPV Vaccine  Aged Out   DTaP/Tdap/Td vaccine  Discontinued    Advanced directives: (Copy Requested) Please bring a copy of your health care power of attorney and living will to the office to be added to your chart at your convenience.  Next Medicare Annual Wellness Visit scheduled for next year: Yes 09/06/24 @ 8:15

## 2023-09-02 NOTE — Progress Notes (Signed)
Subjective:   Lurena Naeve is a 82 y.o. female who presents for Medicare Annual (Subsequent) preventive examination.  Visit Complete: Virtual I connected with  Mason Jim on 09/02/23 by a audio enabled telemedicine application and verified that I am speaking with the correct person using two identifiers.  Patient Location: Home  Provider Location: Office/Clinic  I discussed the limitations of evaluation and management by telemedicine. The patient expressed understanding and agreed to proceed.  Vital Signs: Because this visit was a virtual/telehealth visit, some criteria may be missing or patient reported. Any vitals not documented were not able to be obtained and vitals that have been documented are patient reported.   Cardiac Risk Factors include: diabetes mellitus;advanced age (>21men, >23 women);dyslipidemia;hypertension;obesity (BMI >30kg/m2)     Objective:    Today's Vitals   09/02/23 0816  Weight: 181 lb (82.1 kg)  Height: 5\' 3"  (1.6 m)   Body mass index is 32.06 kg/m.     09/02/2023    8:27 AM 08/11/2022    8:43 AM 08/09/2021    9:54 AM 08/08/2020    9:45 AM 04/25/2020    7:16 AM 04/18/2020    2:02 PM 08/08/2019    9:42 AM  Advanced Directives  Does Patient Have a Medical Advance Directive? Yes Yes Yes Yes Yes No Yes  Type of Estate agent of [place];Living will Healthcare Power of [place];Living will Healthcare Power of [place];Living will Healthcare Power of [place];Living will   Healthcare Power of [place];Living will  Does patient want to make changes to medical advance directive?  No - Patient declined No - Patient declined No - Patient declined No - Patient declined  No - Patient declined  Copy of Healthcare Power of Attorney in Chart? No - copy requested No - copy requested No - copy requested Yes - validated most recent copy scanned in chart (See row information)   Yes - validated most recent copy scanned in chart (See row  information)  Would patient like information on creating a medical advance directive?     No - Patient declined No - Patient declined     Current Medications (verified) Outpatient Encounter Medications as of 09/02/2023  Medication Sig   amLODipine (NORVASC) 5 MG tablet Take 1 tablet (5 mg total) by mouth daily.   rosuvastatin (CRESTOR) 10 MG tablet Take 1 tablet (10 mg total) by mouth daily.   telmisartan (MICARDIS) 40 MG tablet Take 1 tablet (40 mg total) by mouth daily.   No facility-administered encounter medications on file as of 09/02/2023.    Allergies (verified) Patient has no known allergies.   History: Past Medical History:  Diagnosis Date   Essential hypertension    History of chicken pox    Hypercholesterolemia    Left breast mass    Past Surgical History:  Procedure Laterality Date   BREAST BIOPSY Left 02/23/2020   Affirm Biopsy- X- Clip, path pending   BREAST LUMPECTOMY WITH RADIOACTIVE SEED LOCALIZATION Left 04/25/2020   Procedure: LEFT BREAST LUMPECTOMY WITH RADIOACTIVE SEED LOCALIZATION;  Surgeon: Griselda Miner, MD;  Location: [facility];  Service: General;  Laterality: Left;   cataract surgery     bilateral   COLONOSCOPY WITH PROPOFOL N/A 12/03/2016   Procedure: COLONOSCOPY WITH PROPOFOL;  Surgeon: Scot Jun, MD;  Location: [facility];  Service: Endoscopy;  Laterality: N/A;   Family History  Problem Relation Age of Onset   Hypertension Mother    Congestive Heart Failure Mother  Colon cancer Brother    Breast cancer Neg Hx    Social History   Socioeconomic History   Marital status: Single    Spouse name: Not on file   Number of children: Not on file   Years of education: Not on file   Highest education level: Not on file  Occupational History   Not on file  Tobacco Use   Smoking status: Never   Smokeless tobacco: Never  Substance and Sexual Activity   Alcohol use: No    Alcohol/week: 0.0 standard drinks of alcohol    Drug use: No   Sexual activity: Not Currently    Birth control/protection: Post-menopausal  Other Topics Concern   Not on file  Social History Narrative   Never married   Social Determinants of Health   Financial Resource Strain: Low Risk  (09/02/2023)   Overall Financial Resource Strain (CARDIA)    Difficulty of Paying Living Expenses: Not hard at all  Food Insecurity: No Food Insecurity (09/02/2023)   Hunger Vital Sign    Worried About Running Out of Food in the Last Year: Never true    Ran Out of Food in the Last Year: Never true  Transportation Needs: No Transportation Needs (09/02/2023)   PRAPARE - Administrator, Civil Service (Medical): No    Lack of Transportation (Non-Medical): No  Physical Activity: Inactive (09/02/2023)   Exercise Vital Sign    Days of Exercise per Week: 0 days    Minutes of Exercise per Session: 0 min  Stress: No Stress Concern Present (09/02/2023)   [employer] of Occupational Health - Occupational Stress Questionnaire    Feeling of Stress : Not at all  Social Connections: Moderately Integrated (09/02/2023)   Social Connection and Isolation Panel [NHANES]    Frequency of Communication with Friends and Family: More than three times a week    Frequency of Social Gatherings with Friends and Family: Twice a week    Attends Religious Services: More than 4 times per year    Active Member of [employer] or Organizations: Yes    Attends Engineer, structural: More than 4 times per year    Marital Status: Never married    Tobacco Counseling Counseling given: Not Answered   Clinical Intake:  Pre-visit preparation completed: No  Pain : No/denies pain     BMI - recorded: 32.06 Nutritional Risks: None Diabetes: No  How often do you need to have someone help you when you read instructions, pamphlets, or other written materials from your doctor or pharmacy?: 1 - Never  Interpreter Needed?: No  Information entered by :: R. Tameah Mihalko  LPN   Activities of Daily Living    09/02/2023    8:18 AM  In your present state of health, do you have any difficulty performing the following activities:  Hearing? 0  Vision? 0  Difficulty concentrating or making decisions? 0  Comment readers  Walking or climbing stairs? 0  Dressing or bathing? 0  Doing errands, shopping? 0  Preparing Food and eating ? N  Using the Toilet? N  In the past six months, have you accidently leaked urine? N  Do you have problems with loss of bowel control? N  Managing your Medications? N  Managing your Finances? N  Housekeeping or managing your Housekeeping? N    Patient Care Team: Dale [place], MD as PCP - General (Internal Medicine)  Indicate any recent Medical Services you may have received from other than [facility] providers  in the past year (date may be approximate).     Assessment:   This is a routine wellness examination for [place].  Hearing/Vision screen Hearing Screening - Comments:: No issues Vision Screening - Comments:: readers   Goals Addressed             This Visit's Progress    Patient Stated       Wants to lose weight and start back exercising       Depression Screen    09/02/2023    8:22 AM 10/16/2022    7:09 AM 08/11/2022    8:41 AM 07/10/2022    9:34 AM 02/10/2022    3:50 PM 08/09/2021    9:53 AM 08/08/2020    9:35 AM  PHQ 2/9 Scores  PHQ - 2 Score 0 0 0 0 0 0 0  PHQ- 9 Score 0          Fall Risk    09/02/2023    8:20 AM 10/16/2022    7:09 AM 08/11/2022    8:43 AM 07/10/2022    9:34 AM 02/10/2022    3:50 PM  Fall Risk   Falls in the past year? 0 0 0 0 0  Number falls in past yr: 0 0 0 0   Injury with Fall? 0 0 0 0   Risk for fall due to : No Fall Risks No Fall Risks No Fall Risks No Fall Risks No Fall Risks  Follow up Falls prevention discussed;Falls evaluation completed Falls evaluation completed Falls evaluation completed Falls evaluation completed Falls evaluation completed    MEDICARE RISK AT  HOME: Medicare Risk at Home Any stairs in or around the home?: No If so, are there any without handrails?: No Home free of loose throw rugs in walkways, pet beds, electrical cords, etc?: Yes Adequate lighting in your home to reduce risk of falls?: Yes Life alert?: No Use of a cane, walker or w/c?: No Grab bars in the bathroom?: No Shower chair or bench in shower?: No Elevated toilet seat or a handicapped toilet?: No     Cognitive Function:    07/30/2017    9:26 AM 07/28/2016    9:08 AM 07/27/2015    8:56 AM  MMSE - Mini Mental State Exam  Orientation to time 5 5 5   Orientation to Place 5 5 5   Registration 3 3 3   Attention/ Calculation 4 5 5   Attention/Calculation-comments Difficulty subtracting simple calculations    Recall 3 2 3   Language- name 2 objects 2 2 2   Language- repeat 1 1 1   Language- follow 3 step command 3 3 3   Language- read & follow direction 1 1 1   Write a sentence 1 1 1   Copy design 1 1 1   Total score 29 29 30         09/02/2023    8:28 AM 08/11/2022    8:47 AM 08/09/2021   10:03 AM 08/08/2020    9:48 AM 08/08/2019    9:49 AM  6CIT Screen  What Year? 0 points 0 points 0 points 0 points 0 points  What month? 0 points 0 points 0 points 0 points 0 points  What time? 0 points 0 points 0 points  0 points  Count back from 20 0 points 0 points 0 points  0 points  Months in reverse 0 points 0 points 0 points 0 points 0 points  Repeat phrase 0 points 0 points 0 points    Total Score 0 points 0  points 0 points      Immunizations Immunization History  Administered Date(s) Administered   Fluad Quad(high Dose 65+) 08/08/2019, 08/03/2020, 08/13/2021, 07/10/2022   Fluad Trivalent(High Dose 65+) 06/24/2023   Influenza, High Dose Seasonal PF 07/28/2016, 07/30/2017, 08/02/2018   Influenza-Unspecified 08/08/2014, 07/13/2015   Moderna SARS-COV2 Booster Vaccination 08/17/2020   Moderna Sars-Covid-2 Vaccination 01/26/2020, 02/16/2020   PFIZER(Purple Top)SARS-COV-2  Vaccination 10/04/2020   Pneumococcal Conjugate-13 07/27/2015   Pneumococcal Polysaccharide-23 12/15/2016   Zoster Recombinant(Shingrix) 09/21/2019, 12/30/2019    TDAP status: Due, Education has been provided regarding the importance of this vaccine. Advised may receive this vaccine at local pharmacy or Health Dept. Aware to provide a copy of the vaccination record if obtained from local pharmacy or Health Dept. Verbalized acceptance and understanding.  Flu Vaccine status: Up to date  Pneumococcal vaccine status: Up to date  Covid-19 vaccine status: Information provided on how to obtain vaccines.   Qualifies for Shingles Vaccine? Yes   Zostavax completed No   Shingrix Completed?: Yes  Screening Tests Health Maintenance  Topic Date Due   Diabetic kidney evaluation - Urine ACR  06/11/2023   COVID-19 Vaccine (5 - 2023-24 season) 06/28/2023   MAMMOGRAM  09/10/2023   HEMOGLOBIN A1C  12/19/2023   OPHTHALMOLOGY EXAM  02/18/2024   Diabetic kidney evaluation - eGFR measurement  06/17/2024   FOOT EXAM  06/23/2024   Medicare Annual Wellness (AWV)  09/01/2024   Pneumonia Vaccine 56+ Years old  Completed   INFLUENZA VACCINE  Completed   DEXA SCAN  Completed   Zoster Vaccines- Shingrix  Completed   HPV VACCINES  Aged Out   DTaP/Tdap/Td  Discontinued    Health Maintenance  Health Maintenance Due  Topic Date Due   Diabetic kidney evaluation - Urine ACR  06/11/2023   COVID-19 Vaccine (5 - 2023-24 season) 06/28/2023    Colorectal cancer screening: No longer required.   Mammogram status: Completed 08/2022. Repeat every year Appointment scheduled 09/11/23   Bone Density status Declines at this time wants to think about it  Lung Cancer Screening: (Low Dose CT Chest recommended if Age 62-80 years, 20 pack-year currently smoking OR have quit w/in 15years.) does not qualify.     Additional Screening:  Hepatitis C Screening: does not qualify; Completed NA age  Vision Screening:  Recommended annual ophthalmology exams for early detection of glaucoma and other disorders of the eye. Is the patient up to date with their annual eye exam?  Yes  Who is the provider or what is the name of the office in which the patient attends annual eye exams? [facility] If pt is not established with a provider, would they like to be referred to a provider to establish care? No .   Dental Screening: Recommended annual dental exams for proper oral hygiene  Diabetic Foot Exam: Diabetic Foot Exam: Completed 05/2023  Community Resource Referral / Chronic Care Management: CRR required this visit?  No   CCM required this visit?  No     Plan:     I have personally reviewed and noted the following in the patient's chart:   Medical and social history Use of alcohol, tobacco or illicit drugs  Current medications and supplements including opioid prescriptions. Patient is not currently taking opioid prescriptions. Functional ability and status Nutritional status Physical activity Advanced directives List of other physicians Hospitalizations, surgeries, and ER visits in previous 12 months Vitals Screenings to include cognitive, depression, and falls Referrals and appointments  In addition, I have reviewed  and discussed with patient certain preventive protocols, quality metrics, and best practice recommendations. A written personalized care plan for preventive services as well as general preventive health recommendations were provided to patient.     Sydell Axon, LPN   [id number]   After Visit Summary: (Declined) Due to this being a telephonic visit, with patients personalized plan was offered to patient but patient Declined AVS at this time   Nurse Notes: None

## 2023-09-11 ENCOUNTER — Ambulatory Visit
Admission: RE | Admit: 2023-09-11 | Discharge: 2023-09-11 | Disposition: A | Discharge status: Home / Self Care | Payer: Medicare Other | Source: Ambulatory Visit | Attending: Internal Medicine | Admitting: Internal Medicine

## 2023-09-11 DIAGNOSIS — Z1231 Encounter for screening mammogram for malignant neoplasm of breast: Secondary | ICD-10-CM | POA: Insufficient documentation

## 2023-09-14 ENCOUNTER — Other Ambulatory Visit: Payer: Self-pay | Admitting: Internal Medicine

## 2023-09-14 DIAGNOSIS — R928 Other abnormal and inconclusive findings on diagnostic imaging of breast: Secondary | ICD-10-CM

## 2023-09-15 ENCOUNTER — Telehealth: Payer: Self-pay

## 2023-09-15 NOTE — Telephone Encounter (Signed)
-----   Message from Ramah sent at 09/14/2023  9:07 PM EST ----- Please call and notify - I reviewed her mammogram results and radiology is recommending a f/u mammogram and ultrasound of the right breast.  Orders placed.  Someone should be contacting her with an appt date and time.  Let us know if not scheduled.

## 2023-09-15 NOTE — Telephone Encounter (Signed)
Lvm for pt to give office a call back

## 2023-09-16 NOTE — Telephone Encounter (Signed)
Patient walked in and I read her the message. I told her someone will be contacting her to schedule a follow up appointment.

## 2023-09-17 NOTE — Telephone Encounter (Signed)
Noted  

## 2023-09-29 ENCOUNTER — Ambulatory Visit
Admission: RE | Admit: 2023-09-29 | Discharge: 2023-09-29 | Disposition: A | Discharge status: Home / Self Care | Payer: Medicare Other | Source: Ambulatory Visit | Attending: Internal Medicine | Admitting: Internal Medicine

## 2023-09-29 DIAGNOSIS — R92333 Mammographic heterogeneous density, bilateral breasts: Secondary | ICD-10-CM | POA: Diagnosis not present

## 2023-09-29 DIAGNOSIS — R928 Other abnormal and inconclusive findings on diagnostic imaging of breast: Secondary | ICD-10-CM | POA: Diagnosis not present

## 2023-09-30 ENCOUNTER — Telehealth: Payer: Self-pay

## 2023-09-30 NOTE — Telephone Encounter (Signed)
Lvm for pt to give office a call back in regards to lab results

## 2023-09-30 NOTE — Telephone Encounter (Signed)
-----   Message from [place] sent at 09/29/2023  9:31 PM EST ----- Please call and notify Sheri Cook that I reviewed her f/u right breast mammogram and ultrasound results and radiology felt everything is ok.  Recommended to continue yearly screening mammogram.

## 2023-10-01 ENCOUNTER — Telehealth: Payer: Self-pay

## 2023-10-01 NOTE — Telephone Encounter (Signed)
Lvm for pt to give office a call back (2nd attempt)

## 2023-10-01 NOTE — Telephone Encounter (Signed)
-----   Message from [place] sent at 09/29/2023  9:31 PM EST ----- Please call and notify Sheri Cook that I reviewed her f/u right breast mammogram and ultrasound results and radiology felt everything is ok.  Recommended to continue yearly screening mammogram.

## 2023-10-05 NOTE — Telephone Encounter (Signed)
Patient states she is returning our call.  I read Dr. Randell Patient Scott's message to patient.

## 2023-10-05 NOTE — Telephone Encounter (Signed)
Noted  

## 2023-10-27 ENCOUNTER — Other Ambulatory Visit (INDEPENDENT_AMBULATORY_CARE_PROVIDER_SITE_OTHER): Payer: Medicare Other

## 2023-10-27 DIAGNOSIS — E7800 Pure hypercholesterolemia, unspecified: Secondary | ICD-10-CM | POA: Diagnosis not present

## 2023-10-27 DIAGNOSIS — E1165 Type 2 diabetes mellitus with hyperglycemia: Secondary | ICD-10-CM

## 2023-10-27 LAB — BASIC METABOLIC PANEL
BUN: 23 mg/dL (ref 6–23)
CO2: 25 meq/L (ref 19–32)
Calcium: 10 mg/dL (ref 8.4–10.5)
Chloride: 109 meq/L (ref 96–112)
Creatinine, Ser: 1.15 mg/dL (ref 0.40–1.20)
GFR: 44.36 mL/min — ABNORMAL LOW (ref >60.00)
Glucose, Bld: 96 mg/dL (ref 70–99)
Potassium: 3.7 meq/L (ref 3.5–5.1)
Sodium: 142 meq/L (ref 135–145)

## 2023-10-27 LAB — HEPATIC FUNCTION PANEL
ALT: 21 U/L (ref 0–35)
AST: 18 U/L (ref 0–37)
Albumin: 4.3 g/dL (ref 3.5–5.2)
Alkaline Phosphatase: 95 U/L (ref 39–117)
Bilirubin, Direct: 0.1 mg/dL (ref 0.0–0.3)
Total Bilirubin: 0.6 mg/dL (ref 0.2–1.2)
Total Protein: 6.9 g/dL (ref 6.0–8.3)

## 2023-10-27 LAB — LIPID PANEL
Cholesterol: 206 mg/dL — ABNORMAL HIGH (ref 0–200)
HDL: 45.8 mg/dL (ref >39.00)
LDL Cholesterol: 139 mg/dL — ABNORMAL HIGH (ref 0–99)
NonHDL: 160.38
Total CHOL/HDL Ratio: 5
Triglycerides: 107 mg/dL (ref 0.0–149.0)
VLDL: 21.4 mg/dL (ref 0.0–40.0)

## 2023-10-27 LAB — HEMOGLOBIN A1C: Hgb A1c MFr Bld: 6.6 % — ABNORMAL HIGH (ref 4.6–6.5)

## 2023-10-27 LAB — MICROALBUMIN / CREATININE URINE RATIO
Creatinine,U: 66.4 mg/dL
Microalb Creat Ratio: 4.1 mg/g (ref 0.0–30.0)
Microalb, Ur: 2.7 mg/dL — ABNORMAL HIGH (ref 0.0–1.9)

## 2023-11-02 NOTE — Progress Notes (Signed)
 [name], female    DOB: 1941-02-24, 83 y.o.   MRN: [id number]  Patient here for  Chief Complaint  Patient presents with   Annual Exam    HPI Here for a physical exam.  She reports she is doing well. Stays active. No chest pain or sob reported. No abdominal pain or bowel change reported. Overall feels good. Discussed bone density. Wants to wait until spring. Will schedule at next appt.  Discussed labs.  Stay hydrated.    Past Medical History:  Diagnosis Date   Essential hypertension    History of chicken pox    Hypercholesterolemia    Left breast mass    Past Surgical History:  Procedure Laterality Date   BREAST BIOPSY Left 02/23/2020   Affirm Biopsy- X- Clip, path pending   BREAST LUMPECTOMY WITH RADIOACTIVE SEED LOCALIZATION Left 04/25/2020   Procedure: LEFT BREAST LUMPECTOMY WITH RADIOACTIVE SEED LOCALIZATION;  Surgeon: Curvin Deward MOULD, MD;  Location: [facility];  Service: General;  Laterality: Left;   cataract surgery     bilateral   COLONOSCOPY WITH PROPOFOL  N/A 12/03/2016   Procedure: COLONOSCOPY WITH PROPOFOL ;  Surgeon: Lamar ONEIDA Holmes, MD;  Location: [facility] ENDOSCOPY;  Service: Endoscopy;  Laterality: N/A;   Family History  Problem Relation Age of Onset   Hypertension Mother    Congestive Heart Failure Mother    Colon cancer Brother    Breast cancer Neg Hx    Social History   Socioeconomic History   Marital status: Single    Spouse name: Not on file   Number of children: Not on file   Years of education: Not on file   Highest education level: Not on file  Occupational History   Not on file  Tobacco Use   Smoking status: Never   Smokeless tobacco: Never  Substance and Sexual Activity   Alcohol use: No    Alcohol/week: 0.0 standard drinks of alcohol   Drug use: No   Sexual activity: Not Currently    Birth control/protection: Post-menopausal  Other Topics Concern   Not on file  Social History Narrative    Never married   Social Drivers of Corporate Investment Banker Strain: Low Risk  (09/02/2023)   Overall Financial Resource Strain (CARDIA)    Difficulty of Paying Living Expenses: Not hard at all  Food Insecurity: No Food Insecurity (09/02/2023)   Hunger Vital Sign    Worried About Running Out of Food in the Last Year: Never true    Ran Out of Food in the Last Year: Never true  Transportation Needs: No Transportation Needs (09/02/2023)   PRAPARE - Administrator, Civil Service (Medical): No    Lack of Transportation (Non-Medical): No  Physical Activity: Inactive (09/02/2023)   Exercise Vital Sign    Days of Exercise per Week: 0 days    Minutes of Exercise per Session: 0 min  Stress: No Stress Concern Present (09/02/2023)   [employer] of Occupational Health - Occupational Stress Questionnaire    Feeling of Stress : Not at all  Social Connections: Moderately Integrated (09/02/2023)   Social Connection and Isolation Panel [NHANES]    Frequency of Communication with Friends and Family: More than three times a week    Frequency of Social Gatherings with Friends and Family: Twice a week    Attends Religious Services: More than 4 times per year    Active Member of [employer] or Organizations: Yes  Attends Banker Meetings: More than 4 times per year    Marital Status: Never married     Review of Systems  Constitutional:  Negative for appetite change and unexpected weight change.  HENT:  Negative for congestion, sinus pressure and sore throat.   Eyes:  Negative for pain and visual disturbance.  Respiratory:  Negative for cough, chest tightness and shortness of breath.   Cardiovascular:  Negative for chest pain, palpitations and leg swelling.  Gastrointestinal:  Negative for abdominal pain, diarrhea, nausea and vomiting.  Genitourinary:  Negative for difficulty urinating and dysuria.  Musculoskeletal:  Negative for joint swelling and myalgias.  Skin:  Negative  for color change and rash.  Neurological:  Negative for dizziness and headaches.  Hematological:  Negative for adenopathy. Does not bruise/bleed easily.  Psychiatric/Behavioral:  Negative for agitation and dysphoric mood.        Objective:     BP 130/70   Pulse 74   Temp 98 F (36.7 C)   Resp 16   Ht 5' 3 (1.6 m)   Wt 181 lb (82.1 kg)   SpO2 98%   BMI 32.06 kg/m  Wt Readings from Last 3 Encounters:  11/03/23 181 lb (82.1 kg)  09/02/23 181 lb (82.1 kg)  06/24/23 182 lb 3.2 oz (82.6 kg)    Physical Exam Vitals reviewed.  Constitutional:      General: She is not in acute distress.    Appearance: Normal appearance. She is well-developed.  HENT:     Head: Normocephalic and atraumatic.     Right Ear: External ear normal.     Left Ear: External ear normal.     Mouth/Throat:     Pharynx: No oropharyngeal exudate or posterior oropharyngeal erythema.  Eyes:     General: No scleral icterus.       Right eye: No discharge.        Left eye: No discharge.     Conjunctiva/sclera: Conjunctivae normal.  Neck:     Thyroid : No thyromegaly.  Cardiovascular:     Rate and Rhythm: Normal rate and regular rhythm.  Pulmonary:     Effort: No tachypnea, accessory muscle usage or respiratory distress.     Breath sounds: Normal breath sounds. No decreased breath sounds or wheezing.  Chest:  Breasts:    Right: No inverted nipple, mass, nipple discharge or tenderness (no axillary adenopathy).     Left: No inverted nipple, mass, nipple discharge or tenderness (no axilarry adenopathy).  Abdominal:     General: Bowel sounds are normal.     Palpations: Abdomen is soft.     Tenderness: There is no abdominal tenderness.  Musculoskeletal:        General: No swelling or tenderness.     Cervical back: Neck supple.  Lymphadenopathy:     Cervical: No cervical adenopathy.  Skin:    Findings: No erythema or rash.  Neurological:     Mental Status: She is alert and oriented to person, place, and  time.  Psychiatric:        Mood and Affect: Mood normal.        Behavior: Behavior normal.      Outpatient Encounter Medications as of 11/03/2023  Medication Sig   [DISCONTINUED] amLODipine  (NORVASC ) 5 MG tablet Take 1 tablet (5 mg total) by mouth daily.   [DISCONTINUED] rosuvastatin  (CRESTOR ) 10 MG tablet Take 1 tablet (10 mg total) by mouth daily.   [DISCONTINUED] telmisartan  (MICARDIS ) 40 MG tablet Take 1 tablet (  40 mg total) by mouth daily.   amLODipine  (NORVASC ) 5 MG tablet Take 1 tablet (5 mg total) by mouth daily.   rosuvastatin  (CRESTOR ) 10 MG tablet Take 1 tablet (10 mg total) by mouth daily.   telmisartan  (MICARDIS ) 40 MG tablet Take 1 tablet (40 mg total) by mouth daily.   No facility-administered encounter medications on file as of 11/03/2023.     Lab Results  Component Value Date   WBC 3.4 (L) 02/17/2023   HGB 12.3 02/17/2023   HCT 37.2 02/17/2023   PLT 273.0 02/17/2023   GLUCOSE 96 10/27/2023   CHOL 206 (H) 10/27/2023   TRIG 107.0 10/27/2023   HDL 45.80 10/27/2023   LDLCALC 139 (H) 10/27/2023   ALT 21 10/27/2023   AST 18 10/27/2023   NA 142 10/27/2023   K 3.7 10/27/2023   CL 109 10/27/2023   CREATININE 1.15 10/27/2023   BUN 23 10/27/2023   CO2 25 10/27/2023   TSH 1.98 02/17/2023   HGBA1C 6.6 (H) 10/27/2023   MICROALBUR 2.7 (H) 10/27/2023    MM 3D DIAGNOSTIC MAMMOGRAM UNILATERAL RIGHT BREAST Result Date: 09/29/2023 CLINICAL DATA:  RIGHT breast callback EXAM: DIGITAL DIAGNOSTIC UNILATERAL RIGHT MAMMOGRAM WITH TOMOSYNTHESIS AND CAD; ULTRASOUND RIGHT BREAST LIMITED TECHNIQUE: Right digital diagnostic mammography and breast tomosynthesis was performed. The images were evaluated with computer-aided detection. ; Targeted ultrasound examination of the right breast was performed COMPARISON:  Previous exam(s). ACR Breast Density Category c: The breasts are heterogeneously dense, which may obscure small masses. FINDINGS: The previously described finding does not persist  with additional views, consistent with superimposed tissue. No suspicious mass, microcalcification, or other finding is identified. Targeted ultrasound was performed of the RIGHT axilla and upper outer breast. No suspicious cystic or solid mass is seen at the site of mammographic concern. IMPRESSION: No mammographic or sonographic evidence of malignancy at the site of screening mammographic concern. RECOMMENDATION: Screening mammogram in one year.(Code:SM-B-01Y) I have discussed the findings and recommendations with the patient. If applicable, a reminder letter will be sent to the patient regarding the next appointment. BI-RADS CATEGORY  1: Negative. Electronically Signed   By: Corean Salter M.D.   On: 09/29/2023 16:19   US  LIMITED ULTRASOUND INCLUDING AXILLA RIGHT BREAST Result Date: 09/29/2023 CLINICAL DATA:  RIGHT breast callback EXAM: DIGITAL DIAGNOSTIC UNILATERAL RIGHT MAMMOGRAM WITH TOMOSYNTHESIS AND CAD; ULTRASOUND RIGHT BREAST LIMITED TECHNIQUE: Right digital diagnostic mammography and breast tomosynthesis was performed. The images were evaluated with computer-aided detection. ; Targeted ultrasound examination of the right breast was performed COMPARISON:  Previous exam(s). ACR Breast Density Category c: The breasts are heterogeneously dense, which may obscure small masses. FINDINGS: The previously described finding does not persist with additional views, consistent with superimposed tissue. No suspicious mass, microcalcification, or other finding is identified. Targeted ultrasound was performed of the RIGHT axilla and upper outer breast. No suspicious cystic or solid mass is seen at the site of mammographic concern. IMPRESSION: No mammographic or sonographic evidence of malignancy at the site of screening mammographic concern. RECOMMENDATION: Screening mammogram in one year.(Code:SM-B-01Y) I have discussed the findings and recommendations with the patient. If applicable, a reminder letter will be  sent to the patient regarding the next appointment. BI-RADS CATEGORY  1: Negative. Electronically Signed   By: Corean Salter M.D.   On: 09/29/2023 16:19       Assessment & Plan:  Routine general medical examination at a health care facility  Health care maintenance Assessment & Plan: Physical today 11/02/23.  Mammogram  08/1823 - Birads 0. F/u right breast mammogram and ultrasound - Birads I. Recommended annual screening mammogram. Colonoscopy 2018- diverticulosis. Discussed bone density. Wants to wait until spring. Wants to schedule at next appt.    Hypercholesterolemia Assessment & Plan: Discussed labs and elevated LDL compared to last. States she has missed doses. Discussed importance of taking regularly. Will continue crestor  10mg .  Take daily. Continue low cholesterol diet and exercise.  Follow lipid panel and liver function tests.   Lab Results  Component Value Date   CHOL 206 (H) 10/27/2023   HDL 45.80 10/27/2023   LDLCALC 139 (H) 10/27/2023   TRIG 107.0 10/27/2023   CHOLHDL 5 10/27/2023    Orders: -     Lipid panel; Future -     Hepatic function panel; Future  Type 2 diabetes mellitus with hyperglycemia, without long-term current use of insulin (HCC) Assessment & Plan: Discussed labs. She is watching her diet.  Low-carb diet and exercise.  Sees [facility].  Had eye exam.  Negative retinopathy. Follow met b and a1c.   Lab Results  Component Value Date   HGBA1C 6.6 (H) 10/27/2023    Orders: -     Hemoglobin A1c; Future -     Basic metabolic panel; Future  Hypertension, essential Assessment & Plan: Continue amlodipine .  Off lisinopril  due to concerns regarding intolerance. On micardis  40mg  q day. Remain off hctz.  Follow pressures.  Follow metabolic panel.  Discussed increasing amlodipine  given some elevated pressures on outside readings. She declines.  Wants to monitor.  Will notify me if persistent elevation.   Orders: -     Basic metabolic panel;  Future -     CBC with Differential/Platelet; Future -     TSH; Future -     Basic metabolic panel; Future  Stage 3a chronic kidney disease (HCC) Assessment & Plan: Avoid antiinflammatories.  Stay hydrated.  Follow metabolic panel. GFR slightly decreased on recent check compared to previous (GFR 44). Recheck met b in several weeks to confirm stable.    Anemia, unspecified type Assessment & Plan: Follow cbc.    Other orders -     amLODIPine  Besylate; Take 1 tablet (5 mg total) by mouth daily.  Dispense: 90 tablet; Refill: 3 -     Rosuvastatin  Calcium ; Take 1 tablet (10 mg total) by mouth daily.  Dispense: 90 tablet; Refill: 3 -     Telmisartan ; Take 1 tablet (40 mg total) by mouth daily.  Dispense: 90 tablet; Refill: 3     Allena Hamilton, MD

## 2023-11-02 NOTE — Assessment & Plan Note (Addendum)
 Physical today 11/02/23.  Mammogram 08/1823 - Birads 0. F/u right breast mammogram and ultrasound - Birads I. Recommended annual screening mammogram. Colonoscopy 2018- diverticulosis. Discussed bone density. Wants to wait until spring. Wants to schedule at next appt.

## 2023-11-03 ENCOUNTER — Encounter: Payer: Self-pay | Admitting: Internal Medicine

## 2023-11-03 ENCOUNTER — Ambulatory Visit (INDEPENDENT_AMBULATORY_CARE_PROVIDER_SITE_OTHER): Payer: Medicare Other | Admitting: Internal Medicine

## 2023-11-03 VITALS — BP 130/70 | HR 74 | Temp 98.0°F | Resp 16 | Ht 63.0 in | Wt 181.0 lb

## 2023-11-03 DIAGNOSIS — E1165 Type 2 diabetes mellitus with hyperglycemia: Secondary | ICD-10-CM

## 2023-11-03 DIAGNOSIS — N1831 Chronic kidney disease, stage 3a: Secondary | ICD-10-CM

## 2023-11-03 DIAGNOSIS — Z0000 Encounter for general adult medical examination without abnormal findings: Secondary | ICD-10-CM

## 2023-11-03 DIAGNOSIS — E7800 Pure hypercholesterolemia, unspecified: Secondary | ICD-10-CM | POA: Diagnosis not present

## 2023-11-03 DIAGNOSIS — I10 Essential (primary) hypertension: Secondary | ICD-10-CM | POA: Diagnosis not present

## 2023-11-03 DIAGNOSIS — D649 Anemia, unspecified: Secondary | ICD-10-CM

## 2023-11-03 MED ORDER — AMLODIPINE BESYLATE 5 MG PO TABS: 5.0000 mg | ORAL_TABLET | Freq: Every day | ORAL | 3 refills | Status: DC

## 2023-11-03 MED ORDER — ROSUVASTATIN CALCIUM 10 MG PO TABS: 10.0000 mg | ORAL_TABLET | Freq: Every day | ORAL | 3 refills | Status: DC

## 2023-11-03 MED ORDER — TELMISARTAN 40 MG PO TABS: 40.0000 mg | ORAL_TABLET | Freq: Every day | ORAL | 3 refills | Status: DC

## 2023-11-03 NOTE — Assessment & Plan Note (Signed)
 Discussed labs and elevated LDL compared to last. States she has missed doses. Discussed importance of taking regularly. Will continue crestor  10mg .  Take daily. Continue low cholesterol diet and exercise.  Follow lipid panel and liver function tests.   Lab Results  Component Value Date   CHOL 206 (H) 10/27/2023   HDL 45.80 10/27/2023   LDLCALC 139 (H) 10/27/2023   TRIG 107.0 10/27/2023   CHOLHDL 5 10/27/2023

## 2023-11-03 NOTE — Assessment & Plan Note (Signed)
 Discussed labs. She is watching her diet.  Low-carb diet and exercise.  Sees [facility].  Had eye exam.  Negative retinopathy. Follow met b and a1c.   Lab Results  Component Value Date   HGBA1C 6.6 (H) 10/27/2023

## 2023-11-03 NOTE — Assessment & Plan Note (Signed)
 Avoid antiinflammatories.  Stay hydrated.  Follow metabolic panel. GFR slightly decreased on recent check compared to previous (GFR 44). Recheck met b in several weeks to confirm stable.

## 2023-11-03 NOTE — Assessment & Plan Note (Signed)
 Follow cbc.

## 2023-11-03 NOTE — Assessment & Plan Note (Signed)
 Continue amlodipine .  Off lisinopril  due to concerns regarding intolerance. On micardis  40mg  q day. Remain off hctz.  Follow pressures.  Follow metabolic panel.  Discussed increasing amlodipine  given some elevated pressures on outside readings. She declines.  Wants to monitor.  Will notify me if persistent elevation.

## 2023-12-01 ENCOUNTER — Other Ambulatory Visit (INDEPENDENT_AMBULATORY_CARE_PROVIDER_SITE_OTHER): Payer: Medicare Other

## 2023-12-01 DIAGNOSIS — E1165 Type 2 diabetes mellitus with hyperglycemia: Secondary | ICD-10-CM

## 2023-12-01 DIAGNOSIS — I10 Essential (primary) hypertension: Secondary | ICD-10-CM

## 2023-12-01 DIAGNOSIS — E7800 Pure hypercholesterolemia, unspecified: Secondary | ICD-10-CM

## 2023-12-01 LAB — BASIC METABOLIC PANEL
BUN: 24 mg/dL — ABNORMAL HIGH (ref 6–23)
CO2: 24 meq/L (ref 19–32)
Calcium: 9.3 mg/dL (ref 8.4–10.5)
Chloride: 111 meq/L (ref 96–112)
Creatinine, Ser: 1.33 mg/dL — ABNORMAL HIGH (ref 0.40–1.20)
GFR: 37.23 mL/min — ABNORMAL LOW (ref >60.00)
Glucose, Bld: 96 mg/dL (ref 70–99)
Potassium: 4.4 meq/L (ref 3.5–5.1)
Sodium: 142 meq/L (ref 135–145)

## 2023-12-01 NOTE — Addendum Note (Signed)
Addended by: Jarvis Morgan D on: 12/01/2023 07:38 AM   Modules accepted: Orders

## 2023-12-01 NOTE — Addendum Note (Signed)
Addended by: Jarvis Morgan D on: 12/01/2023 07:37 AM   Modules accepted: Orders

## 2023-12-08 ENCOUNTER — Telehealth: Payer: Self-pay

## 2023-12-08 NOTE — Telephone Encounter (Signed)
Copied from CRM [id number]. Topic: General - Billing Inquiry >> Dec 08, 2023  3:15 PM Corin V wrote: Reason for CRM: Patient returned call to office. Lab results were given to patient. She does not want to schedule labs or a renal ultrasound until she know what insurance will pay and what she would owe out of pocket. She does not want to do anything if insurance will not pay for it.  Phone: [phone]

## 2023-12-09 NOTE — Telephone Encounter (Signed)
LM to discuss with patient.

## 2023-12-10 ENCOUNTER — Telehealth: Payer: Self-pay

## 2023-12-10 NOTE — Telephone Encounter (Signed)
Copied from CRM [id number]. Topic: General - Call Back - No Documentation >> Dec 10, 2023  3:15 PM Fredrich Romans wrote: Reason for CRM: Patient returned call to [place] regarding [place].She would like a return call

## 2023-12-10 NOTE — Telephone Encounter (Signed)
See other note

## 2023-12-15 NOTE — Telephone Encounter (Signed)
LM for patient. Please relay: the labs Dr Lorin Picket wants to repeat are typically covered by insurance. The ultrasound that Dr Lorin Picket is wanting to order- radiology should be able to tell her what her out of pocket cost prior to her having scan done.

## 2023-12-22 NOTE — Telephone Encounter (Signed)
Called patient. Unable to leave message.

## 2024-01-06 DIAGNOSIS — K080 Exfoliation of teeth due to systemic causes: Secondary | ICD-10-CM | POA: Diagnosis not present

## 2024-01-06 NOTE — Telephone Encounter (Signed)
Unable to reach letter mailed

## 2024-02-23 DIAGNOSIS — H524 Presbyopia: Secondary | ICD-10-CM | POA: Diagnosis not present

## 2024-02-23 DIAGNOSIS — H35033 Hypertensive retinopathy, bilateral: Secondary | ICD-10-CM | POA: Diagnosis not present

## 2024-03-03 ENCOUNTER — Other Ambulatory Visit: Payer: Medicare Other

## 2024-03-03 DIAGNOSIS — E1165 Type 2 diabetes mellitus with hyperglycemia: Secondary | ICD-10-CM

## 2024-03-03 DIAGNOSIS — I10 Essential (primary) hypertension: Secondary | ICD-10-CM | POA: Diagnosis not present

## 2024-03-03 DIAGNOSIS — E7800 Pure hypercholesterolemia, unspecified: Secondary | ICD-10-CM | POA: Diagnosis not present

## 2024-03-03 LAB — HEPATIC FUNCTION PANEL
ALT: 20 U/L (ref 0–35)
AST: 18 U/L (ref 0–37)
Albumin: 4 g/dL (ref 3.5–5.2)
Alkaline Phosphatase: 82 U/L (ref 39–117)
Bilirubin, Direct: 0.1 mg/dL (ref 0.0–0.3)
Total Bilirubin: 0.5 mg/dL (ref 0.2–1.2)
Total Protein: 6.5 g/dL (ref 6.0–8.3)

## 2024-03-03 LAB — CBC WITH DIFFERENTIAL/PLATELET
Basophils Absolute: 0 10*3/uL (ref 0.0–0.1)
Basophils Relative: 0.3 % (ref 0.0–3.0)
Eosinophils Absolute: 0.1 10*3/uL (ref 0.0–0.7)
Eosinophils Relative: 2.8 % (ref 0.0–5.0)
HCT: 38 % (ref 36.0–46.0)
Hemoglobin: 12.5 g/dL (ref 12.0–15.0)
Lymphocytes Relative: 31.6 % (ref 12.0–46.0)
Lymphs Abs: 1.3 10*3/uL (ref 0.7–4.0)
MCHC: 33 g/dL (ref 30.0–36.0)
MCV: 92 fl (ref 78.0–100.0)
Monocytes Absolute: 0.3 10*3/uL (ref 0.1–1.0)
Monocytes Relative: 6.9 % (ref 3.0–12.0)
Neutro Abs: 2.4 10*3/uL (ref 1.4–7.7)
Neutrophils Relative %: 58.4 % (ref 43.0–77.0)
Platelets: 255 10*3/uL (ref 150.0–400.0)
RBC: 4.13 Mil/uL (ref 3.87–5.11)
RDW: 15.4 % (ref 11.5–15.5)
WBC: 4.1 10*3/uL (ref 4.0–10.5)

## 2024-03-03 LAB — BASIC METABOLIC PANEL WITH GFR
BUN: 21 mg/dL (ref 6–23)
CO2: 26 meq/L (ref 19–32)
Calcium: 9.7 mg/dL (ref 8.4–10.5)
Chloride: 110 meq/L (ref 96–112)
Creatinine, Ser: 1.09 mg/dL (ref 0.40–1.20)
GFR: 47.19 mL/min — ABNORMAL LOW (ref >60.00)
Glucose, Bld: 100 mg/dL — ABNORMAL HIGH (ref 70–99)
Potassium: 4 meq/L (ref 3.5–5.1)
Sodium: 142 meq/L (ref 135–145)

## 2024-03-03 LAB — LIPID PANEL
Cholesterol: 137 mg/dL (ref 0–200)
HDL: 41 mg/dL (ref >39.00)
LDL Cholesterol: 81 mg/dL (ref 0–99)
NonHDL: 95.65
Total CHOL/HDL Ratio: 3
Triglycerides: 74 mg/dL (ref 0.0–149.0)
VLDL: 14.8 mg/dL (ref 0.0–40.0)

## 2024-03-03 LAB — TSH: TSH: 2.9 u[IU]/mL (ref 0.35–5.50)

## 2024-03-03 LAB — HEMOGLOBIN A1C: Hgb A1c MFr Bld: 6.4 % (ref 4.6–6.5)

## 2024-03-07 ENCOUNTER — Ambulatory Visit: Payer: Medicare Other | Admitting: Internal Medicine

## 2024-03-07 ENCOUNTER — Encounter: Payer: Self-pay | Admitting: Internal Medicine

## 2024-03-07 VITALS — BP 130/72 | HR 65 | Temp 98.0°F | Resp 16 | Ht 63.0 in | Wt 182.8 lb

## 2024-03-07 DIAGNOSIS — E7800 Pure hypercholesterolemia, unspecified: Secondary | ICD-10-CM | POA: Diagnosis not present

## 2024-03-07 DIAGNOSIS — Z800 Family history of malignant neoplasm of digestive organs: Secondary | ICD-10-CM

## 2024-03-07 DIAGNOSIS — E1165 Type 2 diabetes mellitus with hyperglycemia: Secondary | ICD-10-CM

## 2024-03-07 DIAGNOSIS — N1831 Chronic kidney disease, stage 3a: Secondary | ICD-10-CM

## 2024-03-07 DIAGNOSIS — I10 Essential (primary) hypertension: Secondary | ICD-10-CM | POA: Diagnosis not present

## 2024-03-07 NOTE — Assessment & Plan Note (Signed)
 Cholesterol improved. Will continue crestor  10mg .  Take daily. Continue low cholesterol diet and exercise.  Follow lipid panel and liver function tests.  No changes today.  Lab Results  Component Value Date   CHOL 137 03/03/2024   HDL 41.00 03/03/2024   LDLCALC 81 03/03/2024   TRIG 74.0 03/03/2024   CHOLHDL 3 03/03/2024

## 2024-03-07 NOTE — Assessment & Plan Note (Signed)
 Continue low carb diet and exercise. A1c improved 6.4. sees [facility]. Records requested.

## 2024-03-07 NOTE — Assessment & Plan Note (Signed)
 Continue amlodipine .  Off lisinopril  due to concerns regarding intolerance. On micardis  40mg  q day. Remain off hctz.  Follow pressures.  Follow metabolic panel.  Blood pressure as outlined. No changes in medication today.

## 2024-03-07 NOTE — Assessment & Plan Note (Signed)
Colonoscopy 2018.  Per GI, no repeat needed.

## 2024-03-07 NOTE — Assessment & Plan Note (Signed)
 Avoid antiinflammatories.  Stay hydrated.  Trying to stay hydrated. Discussed. Recent GFR improved.   Lab Results  Component Value Date   CREATININE 1.09 03/03/2024

## 2024-03-07 NOTE — Progress Notes (Signed)
 [name], female    DOB: [birth year], 83 y.o.   MRN: [id number]  Patient here for  Chief Complaint  Patient presents with   Medical Management of Chronic Issues    HPI Here for a scheduled follow up - follow up regarding hypercholesterolemia, CKD and hypertension. She reports she is doing well. Staying active. No chest pain or sob reported. No abdominal pain or bowel change reported.  Reviewed labs with her.    Past Medical History:  Diagnosis Date   Essential hypertension    History of chicken pox    Hypercholesterolemia    Left breast mass    Past Surgical History:  Procedure Laterality Date   BREAST BIOPSY Left 02/23/2020   Affirm Biopsy- X- Clip, path pending   BREAST LUMPECTOMY WITH RADIOACTIVE SEED LOCALIZATION Left 04/25/2020   Procedure: LEFT BREAST LUMPECTOMY WITH RADIOACTIVE SEED LOCALIZATION;  Surgeon: Caralyn Chandler, MD;  Location: [facility];  Service: General;  Laterality: Left;   cataract surgery     bilateral   COLONOSCOPY WITH PROPOFOL  N/A 12/03/2016   Procedure: COLONOSCOPY WITH PROPOFOL ;  Surgeon: Cassie Click, MD;  Location: [facility];  Service: Endoscopy;  Laterality: N/A;   Family History  Problem Relation Age of Onset   Hypertension Mother    Congestive Heart Failure Mother    Colon cancer Brother    Breast cancer Neg Hx    Social History   Socioeconomic History   Marital status: Single    Spouse name: Not on file   Number of children: Not on file   Years of education: Not on file   Highest education level: Not on file  Occupational History   Not on file  Tobacco Use   Smoking status: Never   Smokeless tobacco: Never  Substance and Sexual Activity   Alcohol use: No    Alcohol/week: 0.0 standard drinks of alcohol   Drug use: No   Sexual activity: Not Currently    Birth control/protection: Post-menopausal  Other Topics Concern   Not on file  Social History Narrative   Never  married   Social Drivers of Corporate investment banker Strain: Low Risk  (09/02/2023)   Overall Financial Resource Strain (CARDIA)    Difficulty of Paying Living Expenses: Not hard at all  Food Insecurity: No Food Insecurity (09/02/2023)   Hunger Vital Sign    Worried About Running Out of Food in the Last Year: Never true    Ran Out of Food in the Last Year: Never true  Transportation Needs: No Transportation Needs (09/02/2023)   PRAPARE - Administrator, Civil Service (Medical): No    Lack of Transportation (Non-Medical): No  Physical Activity: Inactive (09/02/2023)   Exercise Vital Sign    Days of Exercise per Week: 0 days    Minutes of Exercise per Session: 0 min  Stress: No Stress Concern Present (09/02/2023)   [employer] of Occupational Health - Occupational Stress Questionnaire    Feeling of Stress : Not at all  Social Connections: Moderately Integrated (09/02/2023)   Social Connection and Isolation Panel [NHANES]    Frequency of Communication with Friends and Family: More than three times a week    Frequency of Social Gatherings with Friends and Family: Twice a week    Attends Religious Services: More than 4 times per year    Active Member of [employer] or Organizations: Yes    Attends Club or  Organization Meetings: More than 4 times per year    Marital Status: Never married     Review of Systems  Constitutional:  Negative for appetite change and unexpected weight change.  HENT:  Negative for congestion and sinus pressure.   Respiratory:  Negative for cough, chest tightness and shortness of breath.   Cardiovascular:  Negative for chest pain, palpitations and leg swelling.  Gastrointestinal:  Negative for abdominal pain, diarrhea, nausea and vomiting.  Genitourinary:  Negative for difficulty urinating and dysuria.  Musculoskeletal:  Negative for joint swelling and myalgias.  Skin:  Negative for color change and rash.  Neurological:  Negative for dizziness and  headaches.  Psychiatric/Behavioral:  Negative for agitation and dysphoric mood.        Objective:     BP 130/72   Pulse 65   Temp 98 F (36.7 C)   Resp 16   Ht 5\' 3"  (1.6 m)   Wt 182 lb 12.8 oz (82.9 kg)   SpO2 98%   BMI 32.38 kg/m  Wt Readings from Last 3 Encounters:  03/07/24 182 lb 12.8 oz (82.9 kg)  11/03/23 181 lb (82.1 kg)  09/02/23 181 lb (82.1 kg)    Physical Exam Vitals reviewed.  Constitutional:      General: She is not in acute distress.    Appearance: Normal appearance.  HENT:     Head: Normocephalic and atraumatic.     Right Ear: External ear normal.     Left Ear: External ear normal.     Mouth/Throat:     Pharynx: No oropharyngeal exudate or posterior oropharyngeal erythema.  Eyes:     General: No scleral icterus.       Right eye: No discharge.        Left eye: No discharge.     Conjunctiva/sclera: Conjunctivae normal.  Neck:     Thyroid : No thyromegaly.  Cardiovascular:     Rate and Rhythm: Normal rate and regular rhythm.  Pulmonary:     Effort: No respiratory distress.     Breath sounds: Normal breath sounds. No wheezing.  Abdominal:     General: Bowel sounds are normal.     Palpations: Abdomen is soft.     Tenderness: There is no abdominal tenderness.  Musculoskeletal:        General: No swelling or tenderness.     Cervical back: Neck supple. No tenderness.  Lymphadenopathy:     Cervical: No cervical adenopathy.  Skin:    Findings: No erythema or rash.  Neurological:     Mental Status: She is alert.  Psychiatric:        Mood and Affect: Mood normal.        Behavior: Behavior normal.         Outpatient Encounter Medications as of 03/07/2024  Medication Sig   amLODipine  (NORVASC ) 5 MG tablet Take 1 tablet (5 mg total) by mouth daily.   rosuvastatin  (CRESTOR ) 10 MG tablet Take 1 tablet (10 mg total) by mouth daily.   telmisartan  (MICARDIS ) 40 MG tablet Take 1 tablet (40 mg total) by mouth daily.   No facility-administered  encounter medications on file as of 03/07/2024.     Lab Results  Component Value Date   WBC 4.1 03/03/2024   HGB 12.5 03/03/2024   HCT 38.0 03/03/2024   PLT 255.0 03/03/2024   GLUCOSE 100 (H) 03/03/2024   CHOL 137 03/03/2024   TRIG 74.0 03/03/2024   HDL 41.00 03/03/2024   LDLCALC 81 03/03/2024  ALT 20 03/03/2024   AST 18 03/03/2024   NA 142 03/03/2024   K 4.0 03/03/2024   CL 110 03/03/2024   CREATININE 1.09 03/03/2024   BUN 21 03/03/2024   CO2 26 03/03/2024   TSH 2.90 03/03/2024   HGBA1C 6.4 03/03/2024   MICROALBUR 2.7 (H) 10/27/2023    MM 3D DIAGNOSTIC MAMMOGRAM UNILATERAL RIGHT BREAST Result Date: 09/29/2023 CLINICAL DATA:  RIGHT breast callback EXAM: DIGITAL DIAGNOSTIC UNILATERAL RIGHT MAMMOGRAM WITH TOMOSYNTHESIS AND CAD; ULTRASOUND RIGHT BREAST LIMITED TECHNIQUE: Right digital diagnostic mammography and breast tomosynthesis was performed. The images were evaluated with computer-aided detection. ; Targeted ultrasound examination of the right breast was performed COMPARISON:  Previous exam(s). ACR Breast Density Category c: The breasts are heterogeneously dense, which may obscure small masses. FINDINGS: The previously described finding does not persist with additional views, consistent with superimposed tissue. No suspicious mass, microcalcification, or other finding is identified. Targeted ultrasound was performed of the RIGHT axilla and upper outer breast. No suspicious cystic or solid mass is seen at the site of mammographic concern. IMPRESSION: No mammographic or sonographic evidence of malignancy at the site of screening mammographic concern. RECOMMENDATION: Screening mammogram in one year.(Code:SM-B-01Y) I have discussed the findings and recommendations with the patient. If applicable, a reminder letter will be sent to the patient regarding the next appointment. BI-RADS CATEGORY  1: Negative. Electronically Signed   By: Clancy Crimes M.D.   On: 09/29/2023 16:19   US   LIMITED ULTRASOUND INCLUDING AXILLA RIGHT BREAST Result Date: 09/29/2023 CLINICAL DATA:  RIGHT breast callback EXAM: DIGITAL DIAGNOSTIC UNILATERAL RIGHT MAMMOGRAM WITH TOMOSYNTHESIS AND CAD; ULTRASOUND RIGHT BREAST LIMITED TECHNIQUE: Right digital diagnostic mammography and breast tomosynthesis was performed. The images were evaluated with computer-aided detection. ; Targeted ultrasound examination of the right breast was performed COMPARISON:  Previous exam(s). ACR Breast Density Category c: The breasts are heterogeneously dense, which may obscure small masses. FINDINGS: The previously described finding does not persist with additional views, consistent with superimposed tissue. No suspicious mass, microcalcification, or other finding is identified. Targeted ultrasound was performed of the RIGHT axilla and upper outer breast. No suspicious cystic or solid mass is seen at the site of mammographic concern. IMPRESSION: No mammographic or sonographic evidence of malignancy at the site of screening mammographic concern. RECOMMENDATION: Screening mammogram in one year.(Code:SM-B-01Y) I have discussed the findings and recommendations with the patient. If applicable, a reminder letter will be sent to the patient regarding the next appointment. BI-RADS CATEGORY  1: Negative. Electronically Signed   By: Clancy Crimes M.D.   On: 09/29/2023 16:19       Assessment & Plan:  Hypercholesterolemia Assessment & Plan: Cholesterol improved. Will continue crestor  10mg .  Take daily. Continue low cholesterol diet and exercise.  Follow lipid panel and liver function tests.  No changes today.  Lab Results  Component Value Date   CHOL 137 03/03/2024   HDL 41.00 03/03/2024   LDLCALC 81 03/03/2024   TRIG 74.0 03/03/2024   CHOLHDL 3 03/03/2024    Orders: -     Basic metabolic panel with GFR; Future -     Hemoglobin A1c; Future -     Hepatic function panel; Future -     Lipid panel; Future  Type 2 diabetes mellitus  with hyperglycemia, without long-term current use of insulin (HCC) Assessment & Plan: Continue low carb diet and exercise. A1c improved 6.4. sees [facility]. Records requested.   Orders: -     Basic metabolic panel with  GFR; Future -     Hemoglobin A1c; Future -     Hepatic function panel; Future -     Lipid panel; Future  Hypertension, essential Assessment & Plan: Continue amlodipine .  Off lisinopril  due to concerns regarding intolerance. On micardis  40mg  q day. Remain off hctz.  Follow pressures.  Follow metabolic panel.  Blood pressure as outlined. No changes in medication today.   Orders: -     Basic metabolic panel with GFR; Future -     Hemoglobin A1c; Future -     Hepatic function panel; Future -     Lipid panel; Future  Stage 3a chronic kidney disease (HCC) Assessment & Plan: Avoid antiinflammatories.  Stay hydrated.  Trying to stay hydrated. Discussed. Recent GFR improved.   Lab Results  Component Value Date   CREATININE 1.09 03/03/2024      Family history of colon cancer Assessment & Plan: Colonoscopy 2018.  Per GI, no repeat needed.        Dellar Fenton, MD

## 2024-07-08 ENCOUNTER — Other Ambulatory Visit

## 2024-07-08 DIAGNOSIS — E1165 Type 2 diabetes mellitus with hyperglycemia: Secondary | ICD-10-CM | POA: Diagnosis not present

## 2024-07-08 DIAGNOSIS — E7800 Pure hypercholesterolemia, unspecified: Secondary | ICD-10-CM

## 2024-07-08 DIAGNOSIS — I10 Essential (primary) hypertension: Secondary | ICD-10-CM

## 2024-07-08 LAB — HEPATIC FUNCTION PANEL
ALT: 18 U/L (ref 0–35)
AST: 18 U/L (ref 0–37)
Albumin: 4.1 g/dL (ref 3.5–5.2)
Alkaline Phosphatase: 81 U/L (ref 39–117)
Bilirubin, Direct: 0.1 mg/dL (ref 0.0–0.3)
Total Bilirubin: 0.5 mg/dL (ref 0.2–1.2)
Total Protein: 6.4 g/dL (ref 6.0–8.3)

## 2024-07-08 LAB — HEMOGLOBIN A1C: Hgb A1c MFr Bld: 6.9 % — ABNORMAL HIGH (ref 4.6–6.5)

## 2024-07-08 LAB — BASIC METABOLIC PANEL WITH GFR
BUN: 19 mg/dL (ref 6–23)
CO2: 26 meq/L (ref 19–32)
Calcium: 9.9 mg/dL (ref 8.4–10.5)
Chloride: 111 meq/L (ref 96–112)
Creatinine, Ser: 1.05 mg/dL (ref 0.40–1.20)
GFR: 49.24 mL/min — ABNORMAL LOW (ref >60.00)
Glucose, Bld: 99 mg/dL (ref 70–99)
Potassium: 4.2 meq/L (ref 3.5–5.1)
Sodium: 144 meq/L (ref 135–145)

## 2024-07-08 LAB — LIPID PANEL
Cholesterol: 126 mg/dL (ref 0–200)
HDL: 40.1 mg/dL (ref >39.00)
LDL Cholesterol: 71 mg/dL (ref 0–99)
NonHDL: 85.72
Total CHOL/HDL Ratio: 3
Triglycerides: 76 mg/dL (ref 0.0–149.0)
VLDL: 15.2 mg/dL (ref 0.0–40.0)

## 2024-07-09 ENCOUNTER — Ambulatory Visit: Payer: Self-pay | Admitting: Internal Medicine

## 2024-07-11 ENCOUNTER — Encounter: Payer: Self-pay | Admitting: Internal Medicine

## 2024-07-11 ENCOUNTER — Ambulatory Visit (INDEPENDENT_AMBULATORY_CARE_PROVIDER_SITE_OTHER): Admitting: Internal Medicine

## 2024-07-11 VITALS — BP 138/70 | HR 72 | Resp 16 | Ht 63.0 in | Wt 183.0 lb

## 2024-07-11 DIAGNOSIS — E1165 Type 2 diabetes mellitus with hyperglycemia: Secondary | ICD-10-CM | POA: Diagnosis not present

## 2024-07-11 DIAGNOSIS — I10 Essential (primary) hypertension: Secondary | ICD-10-CM | POA: Diagnosis not present

## 2024-07-11 DIAGNOSIS — N1831 Chronic kidney disease, stage 3a: Secondary | ICD-10-CM | POA: Diagnosis not present

## 2024-07-11 DIAGNOSIS — E7800 Pure hypercholesterolemia, unspecified: Secondary | ICD-10-CM

## 2024-07-11 DIAGNOSIS — Z23 Encounter for immunization: Secondary | ICD-10-CM | POA: Diagnosis not present

## 2024-07-11 DIAGNOSIS — D72819 Decreased white blood cell count, unspecified: Secondary | ICD-10-CM

## 2024-07-11 DIAGNOSIS — Z1231 Encounter for screening mammogram for malignant neoplasm of breast: Secondary | ICD-10-CM

## 2024-07-11 LAB — HM DIABETES FOOT EXAM

## 2024-07-11 NOTE — Assessment & Plan Note (Addendum)
 Continue low carb diet and exercise.  Sees [employer]. Records requested. Discussed recent labs. A1c increased - 6.9. discussed diet.  Lab Results  Component Value Date   HGBA1C 6.9 (H) 07/08/2024

## 2024-07-11 NOTE — Assessment & Plan Note (Signed)
 Continue amlodipine .  Off lisinopril  due to concerns regarding intolerance. On micardis  40mg  q day. Remain off hctz.  Follow pressures.  Follow metabolic panel.  Blood pressure as outlined. No changes in medication today.  GFR improved on recent check.

## 2024-07-11 NOTE — Assessment & Plan Note (Signed)
 Avoid antiinflammatories.  Continue to stay hydrated.  Discussed. Recent GFR improved.  Follow metabolic panel.  Lab Results  Component Value Date   CREATININE 1.05 07/08/2024

## 2024-07-11 NOTE — Assessment & Plan Note (Signed)
 Wbc count 02/2024/ wnl.

## 2024-07-11 NOTE — Progress Notes (Signed)
 [name], female    DOB: 1941/05/02, 83 y.o.   MRN: [id number]  Patient here for  Chief Complaint  Patient presents with   Medical Management of Chronic Issues    HPI Here for a scheduled follow up -  follow up regarding hypercholesterolemia, CKD and hypertension. She reports she is doing well. Feels good. Stays active. No chest pain or sob reported. No abdominal pain or bowel change reported. Does report has been eating more candy and some pasta. Discussed labs. A1c 6.9. continue diet and exercise. Need notes [employer].    Past Medical History:  Diagnosis Date   Essential hypertension    History of chicken pox    Hypercholesterolemia    Left breast mass    Past Surgical History:  Procedure Laterality Date   BREAST BIOPSY Left 02/23/2020   Affirm Biopsy- X- Clip, path pending   BREAST LUMPECTOMY WITH RADIOACTIVE SEED LOCALIZATION Left 04/25/2020   Procedure: LEFT BREAST LUMPECTOMY WITH RADIOACTIVE SEED LOCALIZATION;  Surgeon: Curvin Deward MOULD, MD;  Location: [facility];  Service: General;  Laterality: Left;   cataract surgery     bilateral   COLONOSCOPY WITH PROPOFOL  N/A 12/03/2016   Procedure: COLONOSCOPY WITH PROPOFOL ;  Surgeon: Lamar ONEIDA Holmes, MD;  Location: [facility];  Service: Endoscopy;  Laterality: N/A;   Family History  Problem Relation Age of Onset   Hypertension Mother    Congestive Heart Failure Mother    Colon cancer Brother    Breast cancer Neg Hx    Social History   Socioeconomic History   Marital status: Single    Spouse name: Not on file   Number of children: Not on file   Years of education: Not on file   Highest education level: Not on file  Occupational History   Not on file  Tobacco Use   Smoking status: Never   Smokeless tobacco: Never  Substance and Sexual Activity   Alcohol use: No    Alcohol/week: 0.0 standard drinks of alcohol   Drug use: No   Sexual activity: Not  Currently    Birth control/protection: Post-menopausal  Other Topics Concern   Not on file  Social History Narrative   Never married   Social Drivers of Corporate investment banker Strain: Low Risk  (09/02/2023)   Overall Financial Resource Strain (CARDIA)    Difficulty of Paying Living Expenses: Not hard at all  Food Insecurity: No Food Insecurity (09/02/2023)   Hunger Vital Sign    Worried About Running Out of Food in the Last Year: Never true    Ran Out of Food in the Last Year: Never true  Transportation Needs: No Transportation Needs (09/02/2023)   PRAPARE - Administrator, Civil Service (Medical): No    Lack of Transportation (Non-Medical): No  Physical Activity: Inactive (09/02/2023)   Exercise Vital Sign    Days of Exercise per Week: 0 days    Minutes of Exercise per Session: 0 min  Stress: No Stress Concern Present (09/02/2023)   [employer] of Occupational Health - Occupational Stress Questionnaire    Feeling of Stress : Not at all  Social Connections: Moderately Integrated (09/02/2023)   Social Connection and Isolation Panel    Frequency of Communication with Friends and Family: More than three times a week    Frequency of Social Gatherings with Friends and Family: Twice a week    Attends Religious Services: More than  4 times per year    Active Member of Clubs or Organizations: Yes    Attends Banker Meetings: More than 4 times per year    Marital Status: Never married     Review of Systems  Constitutional:  Negative for appetite change and unexpected weight change.  HENT:  Negative for congestion and sinus pressure.   Respiratory:  Negative for cough, chest tightness and shortness of breath.   Cardiovascular:  Negative for chest pain, palpitations and leg swelling.  Gastrointestinal:  Negative for abdominal pain, diarrhea, nausea and vomiting.  Genitourinary:  Negative for difficulty urinating and dysuria.  Musculoskeletal:  Negative  for joint swelling and myalgias.  Skin:  Negative for color change and rash.  Neurological:  Negative for dizziness and headaches.  Psychiatric/Behavioral:  Negative for agitation and dysphoric mood.        Objective:     BP 138/70   Pulse 72   Resp 16   Ht 5' 3 (1.6 m)   Wt 183 lb (83 kg)   SpO2 98%   BMI 32.42 kg/m  Wt Readings from Last 3 Encounters:  07/11/24 183 lb (83 kg)  03/07/24 182 lb 12.8 oz (82.9 kg)  11/03/23 181 lb (82.1 kg)    Physical Exam Vitals reviewed.  Constitutional:      General: She is not in acute distress.    Appearance: Normal appearance.  HENT:     Head: Normocephalic and atraumatic.     Right Ear: External ear normal.     Left Ear: External ear normal.     Mouth/Throat:     Pharynx: No oropharyngeal exudate or posterior oropharyngeal erythema.  Eyes:     General: No scleral icterus.       Right eye: No discharge.        Left eye: No discharge.     Conjunctiva/sclera: Conjunctivae normal.  Neck:     Thyroid : No thyromegaly.  Cardiovascular:     Rate and Rhythm: Normal rate and regular rhythm.  Pulmonary:     Effort: No respiratory distress.     Breath sounds: Normal breath sounds. No wheezing.  Abdominal:     General: Bowel sounds are normal.     Palpations: Abdomen is soft.     Tenderness: There is no abdominal tenderness.  Musculoskeletal:        General: No swelling or tenderness.     Cervical back: Neck supple. No tenderness.  Lymphadenopathy:     Cervical: No cervical adenopathy.  Skin:    Findings: No erythema or rash.  Neurological:     Mental Status: She is alert.  Psychiatric:        Mood and Affect: Mood normal.        Behavior: Behavior normal.      Diabetic foot exam was performed with the following findings:   No deformities, ulcerations, or other skin breakdown Normal sensation of 10g monofilament Intact posterior tibialis and dorsalis pedis pulses      Outpatient Encounter Medications as of 07/11/2024   Medication Sig   amLODipine  (NORVASC ) 5 MG tablet Take 1 tablet (5 mg total) by mouth daily.   rosuvastatin  (CRESTOR ) 10 MG tablet Take 1 tablet (10 mg total) by mouth daily.   telmisartan  (MICARDIS ) 40 MG tablet Take 1 tablet (40 mg total) by mouth daily.   No facility-administered encounter medications on file as of 07/11/2024.     Lab Results  Component Value Date   WBC 4.1  03/03/2024   HGB 12.5 03/03/2024   HCT 38.0 03/03/2024   PLT 255.0 03/03/2024   GLUCOSE 99 07/08/2024   CHOL 126 07/08/2024   TRIG 76.0 07/08/2024   HDL 40.10 07/08/2024   LDLCALC 71 07/08/2024   ALT 18 07/08/2024   AST 18 07/08/2024   NA 144 07/08/2024   K 4.2 07/08/2024   CL 111 07/08/2024   CREATININE 1.05 07/08/2024   BUN 19 07/08/2024   CO2 26 07/08/2024   TSH 2.90 03/03/2024   HGBA1C 6.9 (H) 07/08/2024    MM 3D DIAGNOSTIC MAMMOGRAM UNILATERAL RIGHT BREAST Result Date: 09/29/2023 CLINICAL DATA:  RIGHT breast callback EXAM: DIGITAL DIAGNOSTIC UNILATERAL RIGHT MAMMOGRAM WITH TOMOSYNTHESIS AND CAD; ULTRASOUND RIGHT BREAST LIMITED TECHNIQUE: Right digital diagnostic mammography and breast tomosynthesis was performed. The images were evaluated with computer-aided detection. ; Targeted ultrasound examination of the right breast was performed COMPARISON:  Previous exam(s). ACR Breast Density Category c: The breasts are heterogeneously dense, which may obscure small masses. FINDINGS: The previously described finding does not persist with additional views, consistent with superimposed tissue. No suspicious mass, microcalcification, or other finding is identified. Targeted ultrasound was performed of the RIGHT axilla and upper outer breast. No suspicious cystic or solid mass is seen at the site of mammographic concern. IMPRESSION: No mammographic or sonographic evidence of malignancy at the site of screening mammographic concern. RECOMMENDATION: Screening mammogram in one year.(Code:SM-B-01Y) I have discussed the  findings and recommendations with the patient. If applicable, a reminder letter will be sent to the patient regarding the next appointment. BI-RADS CATEGORY  1: Negative. Electronically Signed   By: Corean Salter M.D.   On: 09/29/2023 16:19   US  LIMITED ULTRASOUND INCLUDING AXILLA RIGHT BREAST Result Date: 09/29/2023 CLINICAL DATA:  RIGHT breast callback EXAM: DIGITAL DIAGNOSTIC UNILATERAL RIGHT MAMMOGRAM WITH TOMOSYNTHESIS AND CAD; ULTRASOUND RIGHT BREAST LIMITED TECHNIQUE: Right digital diagnostic mammography and breast tomosynthesis was performed. The images were evaluated with computer-aided detection. ; Targeted ultrasound examination of the right breast was performed COMPARISON:  Previous exam(s). ACR Breast Density Category c: The breasts are heterogeneously dense, which may obscure small masses. FINDINGS: The previously described finding does not persist with additional views, consistent with superimposed tissue. No suspicious mass, microcalcification, or other finding is identified. Targeted ultrasound was performed of the RIGHT axilla and upper outer breast. No suspicious cystic or solid mass is seen at the site of mammographic concern. IMPRESSION: No mammographic or sonographic evidence of malignancy at the site of screening mammographic concern. RECOMMENDATION: Screening mammogram in one year.(Code:SM-B-01Y) I have discussed the findings and recommendations with the patient. If applicable, a reminder letter will be sent to the patient regarding the next appointment. BI-RADS CATEGORY  1: Negative. Electronically Signed   By: Corean Salter M.D.   On: 09/29/2023 16:19       Assessment & Plan:  Immunization due -     Flu vaccine HIGH DOSE PF(Fluzone Trivalent)  Type 2 diabetes mellitus with hyperglycemia, without long-term current use of insulin (HCC) Assessment & Plan: Continue low carb diet and exercise.  Sees [employer] [facility]. Records requested. Discussed recent labs. A1c  increased - 6.9. discussed diet.  Lab Results  Component Value Date   HGBA1C 6.9 (H) 07/08/2024     Orders: -     Basic metabolic panel with GFR; Future -     Hemoglobin A1c; Future -     Microalbumin / creatinine urine ratio; Future  Hypertension, essential Assessment & Plan: Continue amlodipine .  Off lisinopril  due to concerns regarding intolerance. On micardis  40mg  q day. Remain off hctz.  Follow pressures.  Follow metabolic panel.  Blood pressure as outlined. No changes in medication today.  GFR improved on recent check.    Hypercholesterolemia Assessment & Plan: Cholesterol improved. Will continue crestor  10mg .  Take daily. Continue low cholesterol diet and exercise.  Follow lipid panel and liver function tests.  No changes today.  Lab Results  Component Value Date   CHOL 126 07/08/2024   HDL 40.10 07/08/2024   LDLCALC 71 07/08/2024   TRIG 76.0 07/08/2024   CHOLHDL 3 07/08/2024    Orders: -     Hepatic function panel; Future -     Lipid panel; Future  Encounter for screening mammogram for malignant neoplasm of breast -     3D Screening Mammogram, Left and Right; Future  Stage 3a chronic kidney disease (HCC) Assessment & Plan: Avoid antiinflammatories.  Continue to stay hydrated.  Discussed. Recent GFR improved.  Follow metabolic panel.  Lab Results  Component Value Date   CREATININE 1.05 07/08/2024      Leukopenia, unspecified type Assessment & Plan: Wbc count 02/2024/ wnl.       Allena Hamilton, MD

## 2024-07-11 NOTE — Assessment & Plan Note (Signed)
 Cholesterol improved. Will continue crestor  10mg .  Take daily. Continue low cholesterol diet and exercise.  Follow lipid panel and liver function tests.  No changes today.  Lab Results  Component Value Date   CHOL 126 07/08/2024   HDL 40.10 07/08/2024   LDLCALC 71 07/08/2024   TRIG 76.0 07/08/2024   CHOLHDL 3 07/08/2024

## 2024-07-13 DIAGNOSIS — K080 Exfoliation of teeth due to systemic causes: Secondary | ICD-10-CM | POA: Diagnosis not present

## 2024-07-29 NOTE — Progress Notes (Signed)
 Sheri Cook                                          MRN: [id number]   07/29/2024   The [facility] Quality Team Specialist reviewed this patient medical record for the purposes of chart review for care gap closure. The following were reviewed: chart review for care gap closure-kidney health evaluation for diabetes:eGFR  and uACR.    [facility] Quality Team

## 2024-08-09 DIAGNOSIS — J029 Acute pharyngitis, unspecified: Secondary | ICD-10-CM | POA: Diagnosis not present

## 2024-08-09 DIAGNOSIS — Z03818 Encounter for observation for suspected exposure to other biological agents ruled out: Secondary | ICD-10-CM | POA: Diagnosis not present

## 2024-08-09 DIAGNOSIS — J069 Acute upper respiratory infection, unspecified: Secondary | ICD-10-CM | POA: Diagnosis not present

## 2024-09-06 ENCOUNTER — Ambulatory Visit: Payer: Medicare Other | Admitting: *Deleted

## 2024-09-06 VITALS — Ht 63.0 in | Wt 183.0 lb

## 2024-09-06 DIAGNOSIS — Z0000 Encounter for general adult medical examination without abnormal findings: Secondary | ICD-10-CM

## 2024-09-06 NOTE — Progress Notes (Signed)
 Subjective:   Sheri Cook is a 83 y.o. female who presents for a Medicare Annual Wellness Visit.  Allergies (verified) Patient has no known allergies.   History: Past Medical History:  Diagnosis Date   Essential hypertension    History of chicken pox    Hypercholesterolemia    Left breast mass    Past Surgical History:  Procedure Laterality Date   BREAST BIOPSY Left 02/23/2020   Affirm Biopsy- X- Clip, path pending   BREAST LUMPECTOMY WITH RADIOACTIVE SEED LOCALIZATION Left 04/25/2020   Procedure: LEFT BREAST LUMPECTOMY WITH RADIOACTIVE SEED LOCALIZATION;  Surgeon: Curvin Deward MOULD, MD;  Location: [facility];  Service: General;  Laterality: Left;   cataract surgery     bilateral   COLONOSCOPY WITH PROPOFOL  N/A 12/03/2016   Procedure: COLONOSCOPY WITH PROPOFOL ;  Surgeon: Lamar ONEIDA Holmes, MD;  Location: [facility] ENDOSCOPY;  Service: Endoscopy;  Laterality: N/A;   Family History  Problem Relation Age of Onset   Hypertension Mother    Congestive Heart Failure Mother    Colon cancer Brother    Breast cancer Neg Hx    Social History   Occupational History   Not on file  Tobacco Use   Smoking status: Never   Smokeless tobacco: Never  Substance and Sexual Activity   Alcohol use: No    Alcohol/week: 0.0 standard drinks of alcohol   Drug use: No   Sexual activity: Not Currently    Birth control/protection: Post-menopausal   Tobacco Counseling Counseling given: Not Answered  SDOH Screenings   Food Insecurity: No Food Insecurity (09/06/2024)  Housing: Low Risk  (09/06/2024)  Transportation Needs: No Transportation Needs (09/06/2024)  Utilities: Not At Risk (09/06/2024)  Alcohol Screen: Low Risk  (09/06/2024)  Depression (PHQ2-9): Low Risk  (09/06/2024)  Financial Resource Strain: Low Risk  (09/06/2024)  Physical Activity: Inactive (09/06/2024)  Social Connections: Moderately Integrated (09/06/2024)  Stress: No Stress Concern Present (09/06/2024)   Tobacco Use: Low Risk  (09/06/2024)  Health Literacy: Adequate Health Literacy (09/06/2024)   Depression Screen    09/06/2024    8:30 AM 11/03/2023    7:16 AM 09/02/2023    8:22 AM 10/16/2022    7:09 AM 08/11/2022    8:41 AM 07/10/2022    9:34 AM 02/10/2022    3:50 PM  PHQ 2/9 Scores  PHQ - 2 Score 0 0 0 0 0 0 0  PHQ- 9 Score 0  0          Data saved with a previous flowsheet row definition      Goals Addressed             This Visit's Progress    Patient Stated       Wants to lose some weight       Visit info / Clinical Intake: Medicare Wellness Visit Type:: Subsequent Annual Wellness Visit Persons participating in visit:: patient Medicare Wellness Visit Mode:: Telephone If telephone:: video declined Because this visit was a virtual/telehealth visit:: pt reported vitals If Telephone or Video please confirm:: I connected with the patient using audio enabled telemedicine application and verified that I am speaking with the correct person using two identifiers; I discussed the limitations of evaluation and management by telemedicine; The patient expressed understanding and agreed to proceed Patient Location:: Home Provider Location:: Office Information given by:: patient Interpreter Needed?: No Pre-visit prep was completed: yes AWV questionnaire completed by patient prior to visit?: no Living arrangements:: (!) lives alone Patient's Overall Health Status  Rating: good Typical amount of pain: none Does pain affect daily life?: no Are you currently prescribed opioids?: no  Dietary Habits and Nutritional Risks How many meals a day?: 3 Eats fruit and vegetables daily?: yes Most meals are obtained by: eating out In the last 2 weeks, have you had any of the following?: none Diabetic:: (!) yes Any non-healing wounds?: no How often do you check your BS?: 0 Would you like to be referred to a Nutritionist or for Diabetic Management? : no  Functional Status Activities of  Daily Living (to include ambulation/medication): Independent Ambulation: Independent Medication Administration: Independent Home Management: Independent Manage your own finances?: yes Primary transportation is: driving Concerns about vision?: no *vision screening is required for WTM* Concerns about hearing?: no  Fall Screening Falls in the past year?: 0 Number of falls in past year: 0 Was there an injury with Fall?: 0 Fall Risk Category Calculator: 0 Patient Fall Risk Level: Low Fall Risk  Fall Risk Patient at Risk for Falls Due to: No Fall Risks Fall risk Follow up: Falls evaluation completed; Falls prevention discussed  Home and Transportation Safety: All rugs have non-skid backing?: yes All stairs or steps have railings?: (!) no Grab bars in the bathtub or shower?: (!) no Have non-skid surface in bathtub or shower?: (!) no Good home lighting?: yes Regular seat belt use?: yes Hospital stays in the last year:: no  Cognitive Assessment Difficulty concentrating, remembering, or making decisions? : no Will 6CIT or Mini Cog be Completed: yes What year is it?: 0 points What month is it?: 0 points Give patient an address phrase to remember (5 components): [address], [place] About what time is it?: 0 points Count backwards from 20 to 1: 0 points Say the months of the year in reverse: 0 points Repeat the address phrase from earlier: 0 points 6 CIT Score: 0 points  Advance Directives (For Healthcare) Does Patient Have a Medical Advance Directive?: Yes Does patient want to make changes to medical advance directive?: No - Patient declined Type of Advance Directive: Healthcare Power of [place]; Living will Copy of Healthcare Power of Attorney in Chart?: No - copy requested Copy of Living Will in Chart?: No - copy requested  Reviewed/Updated  Reviewed/Updated: Reviewed All (Medical, Surgical, Family, Medications, Allergies, Care Teams, Patient Goals)         Objective:    Today's Vitals   09/06/24 0815  Weight: 183 lb (83 kg)  Height: 5' 3 (1.6 m)   Body mass index is 32.42 kg/m.  Current Medications (verified) Outpatient Encounter Medications as of 09/06/2024  Medication Sig   amLODipine  (NORVASC ) 5 MG tablet Take 1 tablet (5 mg total) by mouth daily.   rosuvastatin  (CRESTOR ) 10 MG tablet Take 1 tablet (10 mg total) by mouth daily.   telmisartan  (MICARDIS ) 40 MG tablet Take 1 tablet (40 mg total) by mouth daily.   No facility-administered encounter medications on file as of 09/06/2024.   Hearing/Vision screen Hearing Screening - Comments:: No issues Vision Screening - Comments:: [facility] Up to date Immunizations and Health Maintenance Health Maintenance  Topic Date Due   Diabetic kidney evaluation - Urine ACR  Never done   COVID-19 Vaccine (5 - 2025-26 season) 06/27/2024   Mammogram  09/10/2024   HEMOGLOBIN A1C  01/05/2025   OPHTHALMOLOGY EXAM  02/22/2025   Diabetic kidney evaluation - eGFR measurement  07/08/2025   FOOT EXAM  07/11/2025   Medicare Annual Wellness (AWV)  09/06/2025  Pneumococcal Vaccine: 50+ Years  Completed   Influenza Vaccine  Completed   DEXA SCAN  Completed   Zoster Vaccines- Shingrix  Completed   Meningococcal B Vaccine  Aged Out   DTaP/Tdap/Td  Discontinued        Assessment/Plan:  This is a routine wellness examination for [place].  Patient Care Team: Glendia Shad, MD as PCP - General (Internal Medicine)  I have personally reviewed and noted the following in the patient's chart:   Medical and social history Use of alcohol, tobacco or illicit drugs  Current medications and supplements including opioid prescriptions. Functional ability and status Nutritional status Physical activity Advanced directives List of other physicians Hospitalizations, surgeries, and ER visits in previous 12 months Vitals Screenings to include cognitive, depression, and falls Referrals and  appointments  No orders of the defined types were placed in this encounter.  In addition, I have reviewed and discussed with patient certain preventive protocols, quality metrics, and best practice recommendations. A written personalized care plan for preventive services as well as general preventive health recommendations were provided to patient.   Angeline Fredericks, LPN   88/88/7974   Return in 1 year (on 09/06/2025).  After Visit Summary: (Declined) Due to this being a telephonic visit, with patients personalized plan was offered to patient but patient Declined AVS at this time   Nurse Notes: Discussed the need to update covid vaccine. Patient stated that she would like to discuss with PCP a Dexa Scan before an order is placed.

## 2024-09-06 NOTE — Patient Instructions (Signed)
 Sheri Cook,  Thank you for taking the time for your Medicare Wellness Visit. I appreciate your continued commitment to your health goals. Please review the care plan we discussed, and feel free to reach out if I can assist you further.  Please note that Annual Wellness Visits do not include a physical exam. Some assessments may be limited, especially if the visit was conducted virtually. If needed, we may recommend an in-person follow-up with your provider.  Ongoing Care Seeing your primary care provider every 3 to 6 months helps us  monitor your health and provide consistent, personalized care.  Consider updating your covid vaccine.  Referrals If a referral was made during today's visit and you haven't received any updates within two weeks, please contact the referred provider directly to check on the status.  Recommended Screenings:  Health Maintenance  Topic Date Due   Yearly kidney health urinalysis for diabetes  Never done   COVID-19 Vaccine (5 - 2025-26 season) 06/27/2024   Breast Cancer Screening  09/10/2024   Hemoglobin A1C  01/05/2025   Eye exam for diabetics  02/22/2025   Yearly kidney function blood test for diabetes  07/08/2025   Complete foot exam   07/11/2025   Medicare Annual Wellness Visit  09/06/2025   Pneumococcal Vaccine for age over 44  Completed   Flu Shot  Completed   DEXA scan (bone density measurement)  Completed   Zoster (Shingles) Vaccine  Completed   Meningitis B Vaccine  Aged Out   DTaP/Tdap/Td vaccine  Discontinued       09/06/2024    8:18 AM  Advanced Directives  Does Patient Have a Medical Advance Directive? Yes  Type of Estate Agent of [place];Living will  Does patient want to make changes to medical advance directive? No - Patient declined  Copy of Healthcare Power of Attorney in Chart? No - copy requested    Vision: Annual vision screenings are recommended for early detection of glaucoma, cataracts, and diabetic  retinopathy. These exams can also reveal signs of chronic conditions such as diabetes and high blood pressure.  Dental: Annual dental screenings help detect early signs of oral cancer, gum disease, and other conditions linked to overall health, including heart disease and diabetes.  Please see the attached documents for additional preventive care recommendations.

## 2024-10-03 ENCOUNTER — Ambulatory Visit
Admission: RE | Admit: 2024-10-03 | Discharge: 2024-10-03 | Disposition: A | Source: Ambulatory Visit | Attending: Internal Medicine | Admitting: Internal Medicine

## 2024-10-03 DIAGNOSIS — Z1231 Encounter for screening mammogram for malignant neoplasm of breast: Secondary | ICD-10-CM | POA: Diagnosis not present

## 2024-11-07 ENCOUNTER — Other Ambulatory Visit: Payer: Self-pay | Admitting: Internal Medicine

## 2024-11-08 ENCOUNTER — Other Ambulatory Visit: Payer: Self-pay

## 2024-11-08 MED ORDER — ROSUVASTATIN CALCIUM 10 MG PO TABS: 10.0000 mg | ORAL_TABLET | Freq: Every day | ORAL | 0 refills | Status: DC

## 2024-11-11 ENCOUNTER — Telehealth: Payer: Self-pay | Admitting: Internal Medicine

## 2024-11-11 ENCOUNTER — Other Ambulatory Visit: Payer: Self-pay | Admitting: Internal Medicine

## 2024-11-11 ENCOUNTER — Other Ambulatory Visit

## 2024-11-11 DIAGNOSIS — E7800 Pure hypercholesterolemia, unspecified: Secondary | ICD-10-CM

## 2024-11-11 DIAGNOSIS — E1165 Type 2 diabetes mellitus with hyperglycemia: Secondary | ICD-10-CM

## 2024-11-11 LAB — HEMOGLOBIN A1C: Hgb A1c MFr Bld: 6.7 % — ABNORMAL HIGH (ref 4.6–6.5)

## 2024-11-11 LAB — MICROALBUMIN / CREATININE URINE RATIO
Creatinine,U: 68.8 mg/dL
Microalb Creat Ratio: 96.7 mg/g — ABNORMAL HIGH (ref 0.0–30.0)
Microalb, Ur: 6.6 mg/dL — ABNORMAL HIGH (ref 0.7–1.9)

## 2024-11-11 LAB — LIPID PANEL
Cholesterol: 143 mg/dL (ref 28–200)
HDL: 37.8 mg/dL — ABNORMAL LOW
LDL Cholesterol: 88 mg/dL (ref 10–99)
NonHDL: 105.36
Total CHOL/HDL Ratio: 4
Triglycerides: 88 mg/dL (ref 10.0–149.0)
VLDL: 17.6 mg/dL (ref 0.0–40.0)

## 2024-11-11 LAB — BASIC METABOLIC PANEL WITH GFR
BUN: 18 mg/dL (ref 6–23)
CO2: 25 meq/L (ref 19–32)
Calcium: 9.7 mg/dL (ref 8.4–10.5)
Chloride: 111 meq/L (ref 96–112)
Creatinine, Ser: 1 mg/dL (ref 0.40–1.20)
GFR: 52.08 mL/min — ABNORMAL LOW
Glucose, Bld: 96 mg/dL (ref 70–99)
Potassium: 4.8 meq/L (ref 3.5–5.1)
Sodium: 143 meq/L (ref 135–145)

## 2024-11-11 LAB — HEPATIC FUNCTION PANEL
ALT: 21 U/L (ref 3–35)
AST: 18 U/L (ref 5–37)
Albumin: 4 g/dL (ref 3.5–5.2)
Alkaline Phosphatase: 62 U/L (ref 39–117)
Bilirubin, Direct: 0.1 mg/dL (ref 0.1–0.3)
Total Bilirubin: 0.7 mg/dL (ref 0.2–1.2)
Total Protein: 6 g/dL (ref 6.0–8.3)

## 2024-11-11 NOTE — Telephone Encounter (Signed)
 Prescription Request  11/11/2024  LOV: 07/11/2024  What is the name of the medication or equipment? telmisartan  (MICARDIS ) 40 MG tablet   Have you contacted your pharmacy to request a refill? No   Which pharmacy would you like this sent to?  [facility] [address] [place] - [address] [place] [address] Phone: [phone] Fax: [phone]    Pt states she is completley out her medication   Patient notified that their request is being sent to the clinical staff for review and that they should receive a response within 2 business days.   Please advise at Mobile There is no such number on file (mobile).

## 2024-11-12 ENCOUNTER — Ambulatory Visit: Payer: Self-pay | Admitting: Internal Medicine

## 2024-11-14 MED ORDER — TELMISARTAN 40 MG PO TABS: 40.0000 mg | ORAL_TABLET | Freq: Every day | ORAL | 3 refills | Status: AC

## 2024-11-15 ENCOUNTER — Encounter: Payer: Self-pay | Admitting: Internal Medicine

## 2024-11-15 ENCOUNTER — Ambulatory Visit: Admitting: Internal Medicine

## 2024-11-15 VITALS — BP 156/78 | HR 67 | Temp 99.0°F | Ht 63.0 in | Wt 170.8 lb

## 2024-11-15 DIAGNOSIS — E1165 Type 2 diabetes mellitus with hyperglycemia: Secondary | ICD-10-CM

## 2024-11-15 DIAGNOSIS — N1831 Chronic kidney disease, stage 3a: Secondary | ICD-10-CM | POA: Diagnosis not present

## 2024-11-15 DIAGNOSIS — J189 Pneumonia, unspecified organism: Secondary | ICD-10-CM | POA: Diagnosis not present

## 2024-11-15 DIAGNOSIS — E7800 Pure hypercholesterolemia, unspecified: Secondary | ICD-10-CM

## 2024-11-15 DIAGNOSIS — D72819 Decreased white blood cell count, unspecified: Secondary | ICD-10-CM | POA: Diagnosis not present

## 2024-11-15 DIAGNOSIS — Z0000 Encounter for general adult medical examination without abnormal findings: Secondary | ICD-10-CM

## 2024-11-15 DIAGNOSIS — E1122 Type 2 diabetes mellitus with diabetic chronic kidney disease: Secondary | ICD-10-CM | POA: Diagnosis not present

## 2024-11-15 DIAGNOSIS — I10 Essential (primary) hypertension: Secondary | ICD-10-CM | POA: Diagnosis not present

## 2024-11-15 MED ORDER — AMLODIPINE BESYLATE 5 MG PO TABS: 5.0000 mg | ORAL_TABLET | Freq: Every day | ORAL | 3 refills | Status: AC

## 2024-11-15 MED ORDER — ROSUVASTATIN CALCIUM 10 MG PO TABS: 10.0000 mg | ORAL_TABLET | Freq: Every day | ORAL | 3 refills | Status: AC

## 2024-11-15 NOTE — Assessment & Plan Note (Signed)
 Continue low carb diet and exercise.  Sees Sheri Cook. Records requested. Discussed recent labs. A1c improved - 6.7. follow met b and A1c.  Lab Results  Component Value Date   HGBA1C 6.7 (H) 11/11/2024

## 2024-11-15 NOTE — Assessment & Plan Note (Signed)
 Recently diagnosed with pneumonia. Treated with augmentin and azithromycin. Doing better. Feels better. No sob. Some minimal residual cough. Roitussin DM as directed. F/u cxr in 3-4 weeks.

## 2024-11-15 NOTE — Assessment & Plan Note (Signed)
 Continue amlodipine .  Off lisinopril  due to concerns regarding intolerance. Supposed to be on micardis  40mg  q day. Ran out several days ago. Will restart. Remain off hctz.  Follow pressures.  Follow metabolic panel.  Get her back in soon to reassess.

## 2024-11-15 NOTE — Assessment & Plan Note (Signed)
 Avoid antiinflammatories.  Continue to stay hydrated.  Discussed recent labs. Recent GFR improved.  Follow metabolic panel.  Lab Results  Component Value Date   CREATININE 1.00 11/11/2024

## 2024-11-15 NOTE — Progress Notes (Signed)
 "  [name], female    DOB: 02/17/1941, 84 y.o.   MRN: [id number]  Patient here for  Chief Complaint  Patient presents with   Medical Management of Chronic Issues   Annual Exam    HPI Here for a physical exam. Evaluated acute care 11/04/24 - cough. Diagnosed with pneumonia. CXR - subtle opacity in the RML, possibly atelectasis and prominent pulmonary vasculature. Treated with augmentin and azithromycin. She is better. Cough and congestion better. Some minimal residual cough. No sob. No chest pain. No abdominal pain or bowel change. Specifically denies any diarrhea. She is out of her micardis . Ran out four days ago.    Past Medical History:  Diagnosis Date   Essential hypertension    History of chicken pox    Hypercholesterolemia    Left breast mass    Past Surgical History:  Procedure Laterality Date   BREAST BIOPSY Left 02/23/2020   Affirm Biopsy- X- Clip, path pending   BREAST LUMPECTOMY WITH RADIOACTIVE SEED LOCALIZATION Left 04/25/2020   Procedure: LEFT BREAST LUMPECTOMY WITH RADIOACTIVE SEED LOCALIZATION;  Surgeon: Curvin Deward MOULD, MD;  Location: [facility];  Service: General;  Laterality: Left;   cataract surgery     bilateral   COLONOSCOPY WITH PROPOFOL  N/A 12/03/2016   Procedure: COLONOSCOPY WITH PROPOFOL ;  Surgeon: Lamar ONEIDA Holmes, MD;  Location: [facility] ENDOSCOPY;  Service: Endoscopy;  Laterality: N/A;   Family History  Problem Relation Age of Onset   Hypertension Mother    Congestive Heart Failure Mother    Colon cancer Brother    Breast cancer Neg Hx    Social History   Socioeconomic History   Marital status: Single    Spouse name: Not on file   Number of children: Not on file   Years of education: Not on file   Highest education level: Not on file  Occupational History   Not on file  Tobacco Use   Smoking status: Never   Smokeless tobacco: Never  Substance and Sexual Activity   Alcohol use: No    Alcohol/week:  0.0 standard drinks of alcohol   Drug use: No   Sexual activity: Not Currently    Birth control/protection: Post-menopausal  Other Topics Concern   Not on file  Social History Narrative   Never married   Social Drivers of Health   Tobacco Use: Low Risk (11/15/2024)   Patient History    Smoking Tobacco Use: Never    Smokeless Tobacco Use: Never    Passive Exposure: Not on file  Financial Resource Strain: Low Risk (09/06/2024)   Overall Financial Resource Strain (CARDIA)    Difficulty of Paying Living Expenses: Not hard at all  Food Insecurity: No Food Insecurity (09/06/2024)   Epic    Worried About Programme Researcher, Broadcasting/film/video in the Last Year: Never true    Ran Out of Food in the Last Year: Never true  Transportation Needs: No Transportation Needs (09/06/2024)   Epic    Lack of Transportation (Medical): No    Lack of Transportation (Non-Medical): No  Physical Activity: Inactive (09/06/2024)   Exercise Vital Sign    Days of Exercise per Week: 0 days    Minutes of Exercise per Session: 0 min  Stress: No Stress Concern Present (09/06/2024)   [employer] of Occupational Health - Occupational Stress Questionnaire    Feeling of Stress: Not at all  Social Connections: Moderately Integrated (09/06/2024)   Social Connection and Isolation  Panel    Frequency of Communication with Friends and Family: More than three times a week    Frequency of Social Gatherings with Friends and Family: More than three times a week    Attends Religious Services: More than 4 times per year    Active Member of Clubs or Organizations: Yes    Attends Banker Meetings: More than 4 times per year    Marital Status: Never married  Depression (PHQ2-9): Low Risk (11/15/2024)   Depression (PHQ2-9)    PHQ-2 Score: 2  Alcohol Screen: Low Risk (09/06/2024)   Alcohol Screen    Last Alcohol Screening Score (AUDIT): 0  Housing: Low Risk (09/06/2024)   Epic    Unable to Pay for Housing in the Last Year:  No    Number of Times Moved in the Last Year: 0    Homeless in the Last Year: No  Utilities: Not At Risk (09/06/2024)   Epic    Threatened with loss of utilities: No  Health Literacy: Adequate Health Literacy (09/06/2024)   B1300 Health Literacy    Frequency of need for help with medical instructions: Never     Review of Systems  Constitutional:  Negative for appetite change and unexpected weight change.  HENT:  Negative for congestion and sinus pressure.   Respiratory:  Positive for cough. Negative for chest tightness and shortness of breath.   Cardiovascular:  Negative for chest pain, palpitations and leg swelling.  Gastrointestinal:  Negative for abdominal pain, diarrhea, nausea and vomiting.  Genitourinary:  Negative for difficulty urinating and dysuria.  Musculoskeletal:  Negative for joint swelling and myalgias.  Skin:  Negative for color change and rash.  Neurological:  Negative for dizziness and headaches.  Psychiatric/Behavioral:  Negative for agitation and dysphoric mood.        Objective:     BP (!) 156/78   Pulse 67   Temp 99 F (37.2 C) (Oral)   Ht 5' 3 (1.6 m)   Wt 170 lb 12.8 oz (77.5 kg)   SpO2 97%   BMI 30.26 kg/m  Wt Readings from Last 3 Encounters:  11/15/24 170 lb 12.8 oz (77.5 kg)  09/06/24 183 lb (83 kg)  07/11/24 183 lb (83 kg)    Physical Exam Vitals reviewed.  Constitutional:      General: She is not in acute distress.    Appearance: Normal appearance.  HENT:     Head: Normocephalic and atraumatic.     Right Ear: External ear normal.     Left Ear: External ear normal.     Mouth/Throat:     Pharynx: No oropharyngeal exudate or posterior oropharyngeal erythema.  Eyes:     General: No scleral icterus.       Right eye: No discharge.        Left eye: No discharge.     Conjunctiva/sclera: Conjunctivae normal.  Neck:     Thyroid : No thyromegaly.  Cardiovascular:     Rate and Rhythm: Normal rate and regular rhythm.     Comments: 1-2/6  systolic murmur.  Pulmonary:     Effort: No respiratory distress.     Breath sounds: Normal breath sounds. No wheezing.  Abdominal:     General: Bowel sounds are normal.     Palpations: Abdomen is soft.     Tenderness: There is no abdominal tenderness.  Musculoskeletal:        General: No swelling or tenderness.     Cervical back: Neck supple. No  tenderness.  Lymphadenopathy:     Cervical: No cervical adenopathy.  Skin:    Findings: No erythema or rash.  Neurological:     Mental Status: She is alert.  Psychiatric:        Mood and Affect: Mood normal.        Behavior: Behavior normal.         Outpatient Encounter Medications as of 11/15/2024  Medication Sig   telmisartan  (MICARDIS ) 40 MG tablet Take 1 tablet (40 mg total) by mouth daily.   amLODipine  (NORVASC ) 5 MG tablet Take 1 tablet (5 mg total) by mouth daily.   rosuvastatin  (CRESTOR ) 10 MG tablet Take 1 tablet (10 mg total) by mouth daily.   [DISCONTINUED] amLODipine  (NORVASC ) 5 MG tablet Take 1 tablet (5 mg total) by mouth daily.   [DISCONTINUED] rosuvastatin  (CRESTOR ) 10 MG tablet Take 1 tablet (10 mg total) by mouth daily.   No facility-administered encounter medications on file as of 11/15/2024.     Lab Results  Component Value Date   WBC 4.1 03/03/2024   HGB 12.5 03/03/2024   HCT 38.0 03/03/2024   PLT 255.0 03/03/2024   GLUCOSE 96 11/11/2024   CHOL 143 11/11/2024   TRIG 88.0 11/11/2024   HDL 37.80 (L) 11/11/2024   LDLCALC 88 11/11/2024   ALT 21 11/11/2024   AST 18 11/11/2024   NA 143 11/11/2024   K 4.8 11/11/2024   CL 111 11/11/2024   CREATININE 1.00 11/11/2024   BUN 18 11/11/2024   CO2 25 11/11/2024   TSH 2.90 03/03/2024   HGBA1C 6.7 (H) 11/11/2024   MICROALBUR 6.6 (H) 11/11/2024    MM 3D SCREENING MAMMOGRAM BILATERAL BREAST Result Date: 10/09/2024 CLINICAL DATA:  Screening. EXAM: DIGITAL SCREENING BILATERAL MAMMOGRAM WITH TOMOSYNTHESIS AND CAD TECHNIQUE: Bilateral screening digital craniocaudal  and mediolateral oblique mammograms were obtained. Bilateral screening digital breast tomosynthesis was performed. The images were evaluated with computer-aided detection. COMPARISON:  Previous exam(s). ACR Breast Density Category c: The breasts are heterogeneously dense, which may obscure small masses. FINDINGS: There are no findings suspicious for malignancy. IMPRESSION: No mammographic evidence of malignancy. A result letter of this screening mammogram will be mailed directly to the patient. RECOMMENDATION: Screening mammogram in one year. (Code:SM-B-01Y) BI-RADS CATEGORY  1: Negative. Electronically Signed   By: Curtistine Noble   On: 10/09/2024 08:17       Assessment & Plan:  Health care maintenance Assessment & Plan: Physical today 11/15/24.  Mammogram 08/1823 - Birads 0. F/u right breast mammogram and ultrasound - Birads I. Recommended annual screening mammogram. Mammogram 10/09/24 - Birads I. Colonoscopy 2018- diverticulosis. Discussed bone density today. She wants to hold.    Hypercholesterolemia Assessment & Plan: Continue crestor . Take daily. Continue low cholesterol diet and exercise.  Follow lipid panel and liver function tests.  No changes today.  Lab Results  Component Value Date   CHOL 143 11/11/2024   HDL 37.80 (L) 11/11/2024   LDLCALC 88 11/11/2024   TRIG 88.0 11/11/2024   CHOLHDL 4 11/11/2024    Orders: -     Lipid panel; Future -     Hepatic function panel; Future -     CBC with Differential/Platelet; Future  Type 2 diabetes mellitus with hyperglycemia, without long-term current use of insulin (HCC) Assessment & Plan: Continue low carb diet and exercise.  Sees [employer]. Records requested. Discussed recent labs. A1c improved - 6.7. follow met b and A1c.  Lab Results  Component Value Date   HGBA1C  6.7 (H) 11/11/2024     Orders: -     Hemoglobin A1c; Future -     Basic metabolic panel with GFR; Future -     CBC with Differential/Platelet;  Future  Hypertension, essential Assessment & Plan: Continue amlodipine .  Off lisinopril  due to concerns regarding intolerance. Supposed to be on micardis  40mg  q day. Ran out several days ago. Will restart. Remain off hctz.  Follow pressures.  Follow metabolic panel.  Get her back in soon to reassess.   Orders: -     CBC with Differential/Platelet; Future  Stage 3a chronic kidney disease (HCC) Assessment & Plan: Avoid antiinflammatories.  Continue to stay hydrated.  Discussed recent labs. Recent GFR improved.  Follow metabolic panel.  Lab Results  Component Value Date   CREATININE 1.00 11/11/2024      Pneumonia due to infectious organism, unspecified laterality, unspecified part of lung Assessment & Plan: Recently diagnosed with pneumonia. Treated with augmentin and azithromycin. Doing better. Feels better. No sob. Some minimal residual cough. Roitussin DM as directed. F/u cxr in 3-4 weeks.    Leukopenia, unspecified type Assessment & Plan: Check cbc with next labs.    Other orders -     amLODIPine  Besylate; Take 1 tablet (5 mg total) by mouth daily.  Dispense: 90 tablet; Refill: 3 -     Rosuvastatin  Calcium ; Take 1 tablet (10 mg total) by mouth daily.  Dispense: 90 tablet; Refill: 3     Allena Hamilton, MD "

## 2024-11-15 NOTE — Assessment & Plan Note (Signed)
 Check cbc with next labs.   ?

## 2024-11-15 NOTE — Assessment & Plan Note (Signed)
 Continue crestor . Take daily. Continue low cholesterol diet and exercise.  Follow lipid panel and liver function tests.  No changes today.  Lab Results  Component Value Date   CHOL 143 11/11/2024   HDL 37.80 (L) 11/11/2024   LDLCALC 88 11/11/2024   TRIG 88.0 11/11/2024   CHOLHDL 4 11/11/2024

## 2024-11-15 NOTE — Assessment & Plan Note (Signed)
 Physical today 11/15/24.  Mammogram 08/1823 - Birads 0. F/u right breast mammogram and ultrasound - Birads I. Recommended annual screening mammogram. Mammogram 10/09/24 - Birads I. Colonoscopy 2018- diverticulosis. Discussed bone density today. She wants to hold.

## 2025-01-13 ENCOUNTER — Ambulatory Visit: Admitting: Internal Medicine

## 2025-09-11 ENCOUNTER — Ambulatory Visit
